# Patient Record
Sex: Female | Born: 1955 | Race: White | Hispanic: No | Marital: Married | Smoking: Never smoker
Health system: Southern US, Community
[De-identification: ages and names within clinical notes are randomized; demographics above are authoritative.]

## PROBLEM LIST (undated history)

## (undated) DIAGNOSIS — R519 Headache, unspecified: Secondary | ICD-10-CM

## (undated) DIAGNOSIS — K219 Gastro-esophageal reflux disease without esophagitis: Secondary | ICD-10-CM

## (undated) DIAGNOSIS — I2510 Atherosclerotic heart disease of native coronary artery without angina pectoris: Secondary | ICD-10-CM

## (undated) DIAGNOSIS — C50919 Malignant neoplasm of unspecified site of unspecified female breast: Secondary | ICD-10-CM

## (undated) DIAGNOSIS — R51 Headache: Secondary | ICD-10-CM

## (undated) DIAGNOSIS — Z87442 Personal history of urinary calculi: Secondary | ICD-10-CM

## (undated) DIAGNOSIS — F419 Anxiety disorder, unspecified: Secondary | ICD-10-CM

## (undated) DIAGNOSIS — R112 Nausea with vomiting, unspecified: Secondary | ICD-10-CM

## (undated) DIAGNOSIS — G4733 Obstructive sleep apnea (adult) (pediatric): Secondary | ICD-10-CM

## (undated) DIAGNOSIS — Z9889 Other specified postprocedural states: Secondary | ICD-10-CM

## (undated) DIAGNOSIS — D759 Disease of blood and blood-forming organs, unspecified: Secondary | ICD-10-CM

## (undated) DIAGNOSIS — E785 Hyperlipidemia, unspecified: Secondary | ICD-10-CM

## (undated) DIAGNOSIS — Z923 Personal history of irradiation: Secondary | ICD-10-CM

## (undated) DIAGNOSIS — F32A Depression, unspecified: Secondary | ICD-10-CM

## (undated) DIAGNOSIS — M8580 Other specified disorders of bone density and structure, unspecified site: Secondary | ICD-10-CM

## (undated) DIAGNOSIS — F329 Major depressive disorder, single episode, unspecified: Secondary | ICD-10-CM

## (undated) HISTORY — DX: Atherosclerotic heart disease of native coronary artery without angina pectoris: I25.10

## (undated) HISTORY — PX: ABDOMINAL HYSTERECTOMY: SHX81

## (undated) HISTORY — PX: BUNIONECTOMY: SHX129

## (undated) HISTORY — DX: Anxiety disorder, unspecified: F41.9

## (undated) HISTORY — DX: Hyperlipidemia, unspecified: E78.5

## (undated) HISTORY — DX: Major depressive disorder, single episode, unspecified: F32.9

## (undated) HISTORY — DX: Depression, unspecified: F32.A

## (undated) HISTORY — DX: Gastro-esophageal reflux disease without esophagitis: K21.9

## (undated) HISTORY — DX: Other specified disorders of bone density and structure, unspecified site: M85.80

## (undated) HISTORY — DX: Obstructive sleep apnea (adult) (pediatric): G47.33

## (undated) HISTORY — DX: Malignant neoplasm of unspecified site of unspecified female breast: C50.919

---

## 1997-08-09 ENCOUNTER — Ambulatory Visit (HOSPITAL_COMMUNITY): Admission: RE | Admit: 1997-08-09 | Discharge: 1997-08-09 | Payer: Self-pay | Admitting: Obstetrics and Gynecology

## 1998-03-03 ENCOUNTER — Other Ambulatory Visit: Admission: RE | Admit: 1998-03-03 | Discharge: 1998-03-03 | Payer: Self-pay | Admitting: Obstetrics and Gynecology

## 1998-04-09 ENCOUNTER — Emergency Department (HOSPITAL_COMMUNITY): Admission: EM | Admit: 1998-04-09 | Discharge: 1998-04-09 | Payer: Self-pay | Admitting: Emergency Medicine

## 1998-04-16 ENCOUNTER — Emergency Department (HOSPITAL_COMMUNITY): Admission: EM | Admit: 1998-04-16 | Discharge: 1998-04-16 | Payer: Self-pay | Admitting: Emergency Medicine

## 1998-08-08 ENCOUNTER — Encounter: Payer: Self-pay | Admitting: Obstetrics and Gynecology

## 1998-08-08 ENCOUNTER — Ambulatory Visit (HOSPITAL_COMMUNITY): Admission: RE | Admit: 1998-08-08 | Discharge: 1998-08-08 | Payer: Self-pay | Admitting: Obstetrics and Gynecology

## 1999-03-04 ENCOUNTER — Other Ambulatory Visit: Admission: RE | Admit: 1999-03-04 | Discharge: 1999-03-04 | Payer: Self-pay | Admitting: Obstetrics and Gynecology

## 1999-08-14 ENCOUNTER — Encounter: Payer: Self-pay | Admitting: Obstetrics and Gynecology

## 1999-08-14 ENCOUNTER — Ambulatory Visit (HOSPITAL_COMMUNITY): Admission: RE | Admit: 1999-08-14 | Discharge: 1999-08-14 | Payer: Self-pay | Admitting: Obstetrics and Gynecology

## 2000-03-10 ENCOUNTER — Other Ambulatory Visit: Admission: RE | Admit: 2000-03-10 | Discharge: 2000-03-10 | Payer: Self-pay | Admitting: Obstetrics and Gynecology

## 2000-08-19 ENCOUNTER — Ambulatory Visit (HOSPITAL_COMMUNITY): Admission: RE | Admit: 2000-08-19 | Discharge: 2000-08-19 | Payer: Self-pay | Admitting: Obstetrics and Gynecology

## 2000-08-19 ENCOUNTER — Encounter: Payer: Self-pay | Admitting: Obstetrics and Gynecology

## 2000-08-26 ENCOUNTER — Encounter: Admission: RE | Admit: 2000-08-26 | Discharge: 2000-08-26 | Payer: Self-pay | Admitting: Obstetrics and Gynecology

## 2000-08-26 ENCOUNTER — Encounter: Payer: Self-pay | Admitting: Obstetrics and Gynecology

## 2000-11-01 ENCOUNTER — Encounter: Payer: Self-pay | Admitting: General Surgery

## 2000-11-01 ENCOUNTER — Encounter: Admission: RE | Admit: 2000-11-01 | Discharge: 2000-11-01 | Payer: Self-pay | Admitting: General Surgery

## 2001-08-25 ENCOUNTER — Encounter: Admission: RE | Admit: 2001-08-25 | Discharge: 2001-08-25 | Payer: Self-pay | Admitting: Obstetrics and Gynecology

## 2001-08-25 ENCOUNTER — Encounter: Payer: Self-pay | Admitting: Obstetrics and Gynecology

## 2001-09-28 ENCOUNTER — Encounter: Payer: Self-pay | Admitting: Emergency Medicine

## 2001-09-28 ENCOUNTER — Emergency Department (HOSPITAL_COMMUNITY): Admission: EM | Admit: 2001-09-28 | Discharge: 2001-09-28 | Payer: Self-pay | Admitting: Emergency Medicine

## 2002-06-01 ENCOUNTER — Encounter: Payer: Self-pay | Admitting: Obstetrics and Gynecology

## 2002-06-01 ENCOUNTER — Encounter: Admission: RE | Admit: 2002-06-01 | Discharge: 2002-06-01 | Payer: Self-pay | Admitting: Obstetrics and Gynecology

## 2002-09-20 ENCOUNTER — Encounter: Payer: Self-pay | Admitting: Obstetrics and Gynecology

## 2002-09-20 ENCOUNTER — Encounter: Admission: RE | Admit: 2002-09-20 | Discharge: 2002-09-20 | Payer: Self-pay | Admitting: Obstetrics and Gynecology

## 2002-09-28 ENCOUNTER — Encounter: Admission: RE | Admit: 2002-09-28 | Discharge: 2002-09-28 | Payer: Self-pay | Admitting: Obstetrics and Gynecology

## 2002-09-28 ENCOUNTER — Encounter: Payer: Self-pay | Admitting: Obstetrics and Gynecology

## 2003-05-03 ENCOUNTER — Encounter: Admission: RE | Admit: 2003-05-03 | Discharge: 2003-05-03 | Payer: Self-pay | Admitting: Obstetrics and Gynecology

## 2003-10-11 ENCOUNTER — Encounter: Admission: RE | Admit: 2003-10-11 | Discharge: 2003-10-11 | Payer: Self-pay | Admitting: Obstetrics and Gynecology

## 2003-10-15 ENCOUNTER — Encounter: Admission: RE | Admit: 2003-10-15 | Discharge: 2003-10-15 | Payer: Self-pay | Admitting: Obstetrics and Gynecology

## 2004-05-05 ENCOUNTER — Other Ambulatory Visit: Admission: RE | Admit: 2004-05-05 | Discharge: 2004-05-05 | Payer: Self-pay | Admitting: Obstetrics and Gynecology

## 2004-10-20 ENCOUNTER — Encounter: Admission: RE | Admit: 2004-10-20 | Discharge: 2004-10-20 | Payer: Self-pay | Admitting: Obstetrics and Gynecology

## 2004-11-06 ENCOUNTER — Encounter: Admission: RE | Admit: 2004-11-06 | Discharge: 2004-11-06 | Payer: Self-pay | Admitting: Obstetrics and Gynecology

## 2005-05-10 ENCOUNTER — Other Ambulatory Visit: Admission: RE | Admit: 2005-05-10 | Discharge: 2005-05-10 | Payer: Self-pay | Admitting: Obstetrics and Gynecology

## 2005-12-03 ENCOUNTER — Encounter: Admission: RE | Admit: 2005-12-03 | Discharge: 2005-12-03 | Payer: Self-pay | Admitting: Obstetrics and Gynecology

## 2006-05-12 ENCOUNTER — Other Ambulatory Visit: Admission: RE | Admit: 2006-05-12 | Discharge: 2006-05-12 | Payer: Self-pay | Admitting: Obstetrics and Gynecology

## 2006-12-08 ENCOUNTER — Encounter: Admission: RE | Admit: 2006-12-08 | Discharge: 2006-12-08 | Payer: Self-pay | Admitting: Obstetrics and Gynecology

## 2006-12-15 ENCOUNTER — Encounter: Admission: RE | Admit: 2006-12-15 | Discharge: 2006-12-15 | Payer: Self-pay | Admitting: Obstetrics and Gynecology

## 2006-12-20 ENCOUNTER — Encounter: Admission: RE | Admit: 2006-12-20 | Discharge: 2006-12-20 | Payer: Self-pay | Admitting: Obstetrics and Gynecology

## 2007-01-03 ENCOUNTER — Encounter: Admission: RE | Admit: 2007-01-03 | Discharge: 2007-01-03 | Payer: Self-pay | Admitting: Obstetrics and Gynecology

## 2007-01-03 ENCOUNTER — Encounter (INDEPENDENT_AMBULATORY_CARE_PROVIDER_SITE_OTHER): Payer: Self-pay | Admitting: Diagnostic Radiology

## 2007-01-19 ENCOUNTER — Encounter (INDEPENDENT_AMBULATORY_CARE_PROVIDER_SITE_OTHER): Payer: Self-pay | Admitting: Diagnostic Radiology

## 2007-01-19 ENCOUNTER — Encounter: Admission: RE | Admit: 2007-01-19 | Discharge: 2007-01-19 | Payer: Self-pay | Admitting: Obstetrics and Gynecology

## 2007-03-07 ENCOUNTER — Ambulatory Visit (HOSPITAL_BASED_OUTPATIENT_CLINIC_OR_DEPARTMENT_OTHER): Admission: RE | Admit: 2007-03-07 | Discharge: 2007-03-07 | Payer: Self-pay | Admitting: General Surgery

## 2007-03-07 ENCOUNTER — Encounter (INDEPENDENT_AMBULATORY_CARE_PROVIDER_SITE_OTHER): Payer: Self-pay | Admitting: General Surgery

## 2007-03-07 ENCOUNTER — Encounter: Admission: RE | Admit: 2007-03-07 | Discharge: 2007-03-07 | Payer: Self-pay | Admitting: General Surgery

## 2007-06-14 ENCOUNTER — Other Ambulatory Visit: Admission: RE | Admit: 2007-06-14 | Discharge: 2007-06-14 | Payer: Self-pay | Admitting: Obstetrics and Gynecology

## 2008-03-05 ENCOUNTER — Encounter: Admission: RE | Admit: 2008-03-05 | Discharge: 2008-03-05 | Payer: Self-pay | Admitting: Obstetrics and Gynecology

## 2008-03-08 ENCOUNTER — Encounter: Admission: RE | Admit: 2008-03-08 | Discharge: 2008-03-08 | Payer: Self-pay | Admitting: Obstetrics and Gynecology

## 2008-07-16 ENCOUNTER — Other Ambulatory Visit: Admission: RE | Admit: 2008-07-16 | Discharge: 2008-07-16 | Payer: Self-pay | Admitting: Obstetrics and Gynecology

## 2009-03-18 ENCOUNTER — Encounter: Admission: RE | Admit: 2009-03-18 | Discharge: 2009-03-18 | Payer: Self-pay | Admitting: Obstetrics and Gynecology

## 2009-07-22 ENCOUNTER — Other Ambulatory Visit: Admission: RE | Admit: 2009-07-22 | Discharge: 2009-07-22 | Payer: Self-pay | Admitting: Obstetrics and Gynecology

## 2010-02-19 ENCOUNTER — Emergency Department (HOSPITAL_COMMUNITY): Admission: EM | Admit: 2010-02-19 | Discharge: 2010-02-19 | Payer: Self-pay | Admitting: Emergency Medicine

## 2010-03-24 ENCOUNTER — Encounter: Admission: RE | Admit: 2010-03-24 | Discharge: 2010-03-24 | Payer: Self-pay | Admitting: Obstetrics and Gynecology

## 2010-07-28 ENCOUNTER — Other Ambulatory Visit: Payer: Self-pay | Admitting: Nurse Practitioner

## 2010-07-28 ENCOUNTER — Other Ambulatory Visit
Admission: RE | Admit: 2010-07-28 | Discharge: 2010-07-28 | Payer: Self-pay | Source: Home / Self Care | Admitting: Obstetrics and Gynecology

## 2010-11-17 NOTE — Op Note (Signed)
NAMEAnaiza, [name]                    ACCOUNT NO.:  [id number]   MEDICAL RECORD NO.:  [id number]          PATIENT TYPE:  AMB   LOCATION:  DSC                          FACILITY:  [facility]   PHYSICIAN:  Angelia Mould. Derrell Lolling, M.D.DATE OF BIRTH:  1956-05-24   DATE OF PROCEDURE:  03/07/2007  DATE OF DISCHARGE:                               OPERATIVE REPORT   PREOPERATIVE DIAGNOSIS:  Atypical ductal hyperplasia left breast.   POSTOPERATIVE DIAGNOSIS:  Atypical ductal hyperplasia left breast.   OPERATION PERFORMED:  Excision left breast mass with needle localization  and specimen mammogram.   SURGEON:  Claud Kelp, M.D.   OPERATIVE INDICATIONS:  This is a 55 year old white female with a past  history of fibrocystic breast disease.  Recent screening mammogram  suggested density in the lateral aspect the left breast.  She has had  ultrasound and MRI.  She has had a stereotactic core biopsy of one area  laterally and an MRI guided biopsy of another area laterally and both of  these had a wire clip left.  The first biopsy showed a complex  sclerosing lesion with minimal atypical ductal hyperplasia and pathology  recommended excising this to rule out ductal carcinoma in situ.  The  other biopsy showed fibrocystic disease and calcifications but no  atypia.  I evaluated her as an outpatient.  We felt that both of these  area should be excised with a bracketed wire localization.  She is  brought to operating room electively.   OPERATIVE TECHNIQUE:  The patient underwent insertion of two localizing  wires this morning at the [facility].  These were  satisfactory.  The patient was taken to operating room.  General  anesthesia was induced.  The left breast was prepped and draped in  sterile fashion.  The patient was identified as correct patient, correct  procedure and correct site.  Intravenous antibiotics were given.  0.5%  Marcaine with epinephrine was used as local infiltration  anesthetic.   I observed two wires entering the lateral left breast at about the 3  o'clock position about 5-6 cm lateral to the areolar margin.  I chose to  make a radially oriented transverse incision from just outside the  areolar margin to near the wires.  Dissection was carried down into the  breast tissue.  I brought both of the wires into the wound and dissected  around the wires circumferentially using electrocautery.  The specimen  was marked with silk sutures to orient the pathologist.  I removed the  specimen.   The specimen underwent specimen mammogram by radiology and they said  that I had gotten both areas out completely.  The specimen sent for  routine histology.  Hemostasis was excellent and achieved with  electrocautery.  The wound was irrigated extensively.  We examined the  wound for about 5 minutes and found no other bleeding.  I placed some  interrupted 3-0 Vicryl sutures in the deeper tissues to bring the breast  tissue together and then the skin was closed with running subcuticular  suture of 4-0  Monocryl and Steri-Strips.  Clean bandages were placed and  the patient taken to the recovery room in stable condition.  Estimated  blood loss was about 10-15 mL.   COMPLICATIONS:  None.   Sponge, needle and instrument counts were correct.      Angelia Mould. Derrell Lolling, M.D.  Electronically Signed     HMI/MEDQ  D:  03/07/2007  T:  03/07/2007  Job:  [id number]   cc:   Norva Pavlov, M.D.  Artist Pais, M.D.

## 2011-03-19 ENCOUNTER — Other Ambulatory Visit: Payer: Self-pay | Admitting: Obstetrics and Gynecology

## 2011-03-19 DIAGNOSIS — Z1231 Encounter for screening mammogram for malignant neoplasm of breast: Secondary | ICD-10-CM

## 2011-03-29 ENCOUNTER — Ambulatory Visit
Admission: RE | Admit: 2011-03-29 | Discharge: 2011-03-29 | Disposition: A | Discharge status: Home / Self Care | Payer: BC Managed Care – PPO | Source: Ambulatory Visit | Attending: Obstetrics and Gynecology | Admitting: Obstetrics and Gynecology

## 2011-03-29 DIAGNOSIS — Z1231 Encounter for screening mammogram for malignant neoplasm of breast: Secondary | ICD-10-CM

## 2011-04-16 LAB — CBC
HCT: 42
Hemoglobin: 14.4
MCHC: 34.3
MCV: 93.3
Platelets: 192
RBC: 4.5
RDW: 12.5
WBC: 4

## 2011-04-16 LAB — COMPREHENSIVE METABOLIC PANEL WITH GFR
ALT: 18
AST: 18
Albumin: 4.1
Alkaline Phosphatase: 63
BUN: 12
CO2: 31
Calcium: 9.6
Chloride: 104
Creatinine, Ser: 0.8
GFR calc non Af Amer: >60
Glucose, Bld: 78
Potassium: 4.2
Sodium: 141
Total Bilirubin: 0.8
Total Protein: 6.1

## 2011-04-16 LAB — URINALYSIS, ROUTINE W REFLEX MICROSCOPIC
Bilirubin Urine: NEGATIVE
Glucose, UA: NEGATIVE
Hgb urine dipstick: NEGATIVE
Ketones, ur: NEGATIVE
Nitrite: NEGATIVE
Protein, ur: NEGATIVE
Specific Gravity, Urine: 1.018
Urobilinogen, UA: 0.2
pH: 7.5

## 2011-04-16 LAB — DIFFERENTIAL
Eosinophils Absolute: 0
Eosinophils Relative: 1
Lymphocytes Relative: 31
Lymphs Abs: 1.2
Monocytes Absolute: 0.4

## 2011-10-21 ENCOUNTER — Ambulatory Visit: Payer: BC Managed Care – PPO | Admitting: Physical Therapy

## 2011-12-16 ENCOUNTER — Other Ambulatory Visit: Payer: Self-pay | Admitting: Dermatology

## 2012-07-11 ENCOUNTER — Other Ambulatory Visit: Payer: Self-pay | Admitting: Obstetrics and Gynecology

## 2012-07-11 DIAGNOSIS — Z1231 Encounter for screening mammogram for malignant neoplasm of breast: Secondary | ICD-10-CM

## 2012-08-10 ENCOUNTER — Ambulatory Visit
Admission: RE | Admit: 2012-08-10 | Discharge: 2012-08-10 | Disposition: A | Discharge status: Home / Self Care | Payer: BC Managed Care – PPO | Source: Ambulatory Visit | Attending: Obstetrics and Gynecology | Admitting: Obstetrics and Gynecology

## 2012-08-10 DIAGNOSIS — Z1231 Encounter for screening mammogram for malignant neoplasm of breast: Secondary | ICD-10-CM

## 2012-12-18 ENCOUNTER — Other Ambulatory Visit: Payer: Self-pay | Admitting: Dermatology

## 2013-11-02 ENCOUNTER — Other Ambulatory Visit: Payer: Self-pay

## 2013-11-02 DIAGNOSIS — Z1231 Encounter for screening mammogram for malignant neoplasm of breast: Secondary | ICD-10-CM

## 2013-11-15 ENCOUNTER — Other Ambulatory Visit: Payer: Self-pay | Admitting: Obstetrics and Gynecology

## 2013-11-15 ENCOUNTER — Encounter (INDEPENDENT_AMBULATORY_CARE_PROVIDER_SITE_OTHER): Payer: Self-pay

## 2013-11-15 ENCOUNTER — Ambulatory Visit
Admission: RE | Admit: 2013-11-15 | Discharge: 2013-11-15 | Disposition: A | Discharge status: Home / Self Care | Payer: BC Managed Care – PPO | Source: Ambulatory Visit

## 2013-11-15 DIAGNOSIS — Z1231 Encounter for screening mammogram for malignant neoplasm of breast: Secondary | ICD-10-CM

## 2013-11-15 DIAGNOSIS — N63 Unspecified lump in unspecified breast: Secondary | ICD-10-CM

## 2013-12-04 ENCOUNTER — Ambulatory Visit
Admission: RE | Admit: 2013-12-04 | Discharge: 2013-12-04 | Disposition: A | Discharge status: Home / Self Care | Payer: BC Managed Care – PPO | Source: Ambulatory Visit | Attending: Obstetrics and Gynecology | Admitting: Obstetrics and Gynecology

## 2013-12-04 DIAGNOSIS — N63 Unspecified lump in unspecified breast: Secondary | ICD-10-CM

## 2014-04-10 ENCOUNTER — Ambulatory Visit: Payer: BC Managed Care – PPO

## 2014-04-18 ENCOUNTER — Ambulatory Visit: Payer: BC Managed Care – PPO | Attending: Otolaryngology

## 2014-04-18 DIAGNOSIS — Z5189 Encounter for other specified aftercare: Secondary | ICD-10-CM | POA: Diagnosis not present

## 2014-04-18 DIAGNOSIS — R490 Dysphonia: Secondary | ICD-10-CM | POA: Diagnosis not present

## 2014-04-26 ENCOUNTER — Ambulatory Visit: Payer: BC Managed Care – PPO

## 2014-04-26 DIAGNOSIS — Z5189 Encounter for other specified aftercare: Secondary | ICD-10-CM | POA: Diagnosis not present

## 2014-05-01 ENCOUNTER — Ambulatory Visit: Payer: BC Managed Care – PPO

## 2014-05-01 DIAGNOSIS — Z5189 Encounter for other specified aftercare: Secondary | ICD-10-CM | POA: Diagnosis not present

## 2014-05-08 ENCOUNTER — Ambulatory Visit: Payer: BC Managed Care – PPO | Attending: Otolaryngology

## 2014-05-08 DIAGNOSIS — R490 Dysphonia: Secondary | ICD-10-CM

## 2014-05-08 DIAGNOSIS — Z5189 Encounter for other specified aftercare: Secondary | ICD-10-CM | POA: Insufficient documentation

## 2014-05-08 NOTE — Therapy (Signed)
Speech Language Pathology Treatment  Patient Details  Name: Candace Campbell MRN: [id number] Date of Birth: September 29, 1955  Encounter Date: 05/08/2014      End of Session - 05/08/14 1259    Visit Number 3   Number of Visits 4   Date for OT Re-Evaluation 05/27/14   SLP Start Time 4098   SLP Stop Time 1191   SLP Time Calculation (min) 42 min      No past medical history on file.  No past surgical history on file.  There were no vitals taken for this visit.  Visit Diagnosis: Dysphonia          ADULT SLP TREATMENT - 05/08/14 1200    General Information   Behavior/Cognition Alert;Cooperative;Pleasant mood   Treatment Provided   Treatment provided --  voice   Pain Assessment   Pain Assessment No/denies pain   Cognitive-Linquistic Treatment   Treatment focused on Voice   Skilled Treatment Rates voice 7/10 (10=normal voice) today. Just came back from 2 hours of talking. PT reports reducing talk time last week - friend asked "Why are you so quiet?"  As sessions continue SLP learns of more situations pt involves herself in which are heavy voice use. SLP guided pt through activity to realize if pt desires positive change to voice she will need temporary lifestyle change. SLP strongly encouraged pt to eliminate some/all of these things temporarily in order to gain a voice rated 9-10/10.   Assessment / Recommendations / Plan   Plan Continue with current plan of care   Progression Toward Goals   Progression toward goals Progressing toward goals              SLP Long Term Goals - 05/08/14 1301    SLP LONG TERM GOAL #1   Title pt will talk in confidential voice for 10 minutes with rare min A   Time 4   Period Weeks   Status New   SLP LONG TERM GOAL #2   Title pt will adhere to voice conservation program between sessions as reported by pt   Time 4   Period Weeks   Status New          Plan - 05/08/14 1300    Clinical Impression Statement Pt presents with overuse of  voice. Skilled ST needed to assess voice conservation and assist pt with voice function exercises to improve voice quality   Speech Therapy Frequency min 1 x/week   Duration 4 weeks   Treatment/Interventions Compensatory strategies;Other (comment)  voice function exercises   Potential to Achieve Goals Good   Potential Considerations Previous level of function        Problem List There are no active problems to display for this patient.                                            [facility] 05/08/2014, 1:03 PM

## 2014-05-08 NOTE — Patient Instructions (Signed)
Begin to think about what is possible for you to "put on hold" until January to allow for voice rest.  Continue with voice conservation/hygiene measures

## 2014-05-15 ENCOUNTER — Ambulatory Visit: Payer: BC Managed Care – PPO

## 2014-05-15 DIAGNOSIS — R490 Dysphonia: Secondary | ICD-10-CM

## 2014-05-15 DIAGNOSIS — Z5189 Encounter for other specified aftercare: Secondary | ICD-10-CM | POA: Diagnosis not present

## 2014-05-15 NOTE — Patient Instructions (Signed)
You must further decr voice use for further success with vocal quality - sooner is better! Continue vocal function exercises until [facility] referral date

## 2014-05-15 NOTE — Therapy (Addendum)
Speech Language Pathology Treatment  Patient Details  Name: Candace Campbell MRN: [id number] Date of Birth: 1956-06-17  Encounter Date: 05/15/2014      End of Session - 05/15/14 1245    Visit Number 4   Number of Visits 4   Date for SLP Re-Evaluation 05/27/14   SLP Start Time 1155   SLP Time Calculation (min) 1238   SLP Time Calculation (min) 43 min   Activity Tolerance Patient tolerated treatment well  SLP limited pt vocalization due to pt report of pain in laryngeal area      No past medical history on file.  No past surgical history on file.  There were no vitals taken for this visit.  Visit Diagnosis: Dysphonia  S: Pt relayed to SLP [place] but was unable to do so, speaking frequently. 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       ADULT SLP TREATMENT - 05/15/14 0001    General Information   Behavior/Cognition Alert;Cooperative;Pleasant mood   Treatment Provided   Treatment provided --  voice   Pain Assessment   Pain Assessment 0-10   Pain Score 2    Pain Location --  vocal area   Pain Descriptors / Indicators Discomfort   Pain Intervention(s) Monitored during session;Limited activity within patient's tolerance   Cognitive-Linquistic Treatment   Treatment focused on Voice   Skilled Treatment Rates voice 6/10 (10=normal voice) today. Just came back from 2 hours of talking today. She has tried to reduce talking with grandson but has had difficulty with this. Reports talking at farm 9:15-11:15 could not be avoided - spoke softly but frequently. Pt was able to get approx 80% voice rest past weekend with grandson away and voice rated between 8/10 and 9/10 on Monday. SLP reiterated that decr'd voice use results in better quality voice. Pt said "I really want to stop talking over Christmas." SLP encouraged earlier voice rest than Christmastime. Vocal Function  Exercises completed as pt has not completed them yet today. SLP told pt to continue with exercises until appointment at [facility] / Recommendations / Plan   Plan Discharge SLP treatment due to (comment)  exercses completed without cues, pt knows to incr voice rest   General Recommendations   General recommendations --  pt must further decr voice use; SLP has encouraged this   Follow up Recommendations --  may benefit from [facility] referral for additional tx program?   Progression Toward Goals   Progression toward goals --  partially met goals; needs to further decr voice use              SLP Long Term Goals - 05/15/14 1248    SLP LONG TERM GOAL #1   Status Partially Met   SLP LONG TERM GOAL #2   Status Partially Met          Plan - 05/15/14 1247    Clinical Impression Statement Pt needs to further decr voice use to have further improvement with vocal quality.    Treatment/Interventions Compensatory strategies;SLP instruction and feedback  vocal function exercises   Potential to Achieve Goals Good   Potential Considerations Cooperation/participation level   SLP Home Exercise Plan continue vocal function exercises, further decr voice use   Consulted and Agree with Plan of Care Patient          SPEECH THERAPY DISCHARGE SUMMARY  Visits from Start of Care: 4  Remaining deficits: Dysphonia remains. Pt, at this time, is choosing to limit voice use over Christmas. SLP has encouraged her the earlier she can implement as close to full voice rest as she can, the earlier she will experience improved voice quality. She is beginning to complete Vocal Function Exercises and completion is not as prescribed (once/day as opposed to twice/day).   Education / Equipment: Vocal function exercises, vocal conservation/hygeine.   Plan: Patient agrees to discharge.  Patient goals were partially met. Patient is being discharged due to                                                      ?meeting her current rehab potential.???         Problem List There are no active problems to display for this patient.                                              [facility] 05/15/2014, 12:49 PM

## 2014-07-05 HISTORY — PX: BREAST BIOPSY: SHX20

## 2014-07-05 HISTORY — PX: BREAST LUMPECTOMY: SHX2

## 2015-01-27 ENCOUNTER — Other Ambulatory Visit: Payer: Self-pay

## 2015-01-27 DIAGNOSIS — Z1231 Encounter for screening mammogram for malignant neoplasm of breast: Secondary | ICD-10-CM

## 2015-03-04 ENCOUNTER — Ambulatory Visit
Admission: RE | Admit: 2015-03-04 | Discharge: 2015-03-04 | Disposition: A | Discharge status: Home / Self Care | Payer: BC Managed Care – PPO | Source: Ambulatory Visit

## 2015-03-04 DIAGNOSIS — Z1231 Encounter for screening mammogram for malignant neoplasm of breast: Secondary | ICD-10-CM

## 2015-03-06 ENCOUNTER — Other Ambulatory Visit: Payer: Self-pay | Admitting: Obstetrics and Gynecology

## 2015-03-06 DIAGNOSIS — R928 Other abnormal and inconclusive findings on diagnostic imaging of breast: Secondary | ICD-10-CM

## 2015-03-11 ENCOUNTER — Other Ambulatory Visit: Payer: Self-pay | Admitting: Obstetrics and Gynecology

## 2015-03-11 ENCOUNTER — Ambulatory Visit
Admission: RE | Admit: 2015-03-11 | Discharge: 2015-03-11 | Disposition: A | Discharge status: Home / Self Care | Payer: BC Managed Care – PPO | Source: Ambulatory Visit | Attending: Obstetrics and Gynecology | Admitting: Obstetrics and Gynecology

## 2015-03-11 DIAGNOSIS — R928 Other abnormal and inconclusive findings on diagnostic imaging of breast: Secondary | ICD-10-CM

## 2015-03-13 ENCOUNTER — Other Ambulatory Visit: Payer: Self-pay | Admitting: Obstetrics and Gynecology

## 2015-03-13 ENCOUNTER — Ambulatory Visit
Admission: RE | Admit: 2015-03-13 | Discharge: 2015-03-13 | Disposition: A | Discharge status: Home / Self Care | Payer: BC Managed Care – PPO | Source: Ambulatory Visit | Attending: Obstetrics and Gynecology | Admitting: Obstetrics and Gynecology

## 2015-03-13 DIAGNOSIS — C50919 Malignant neoplasm of unspecified site of unspecified female breast: Secondary | ICD-10-CM

## 2015-03-13 DIAGNOSIS — R928 Other abnormal and inconclusive findings on diagnostic imaging of breast: Secondary | ICD-10-CM

## 2015-03-13 HISTORY — DX: Malignant neoplasm of unspecified site of unspecified female breast: C50.919

## 2015-03-17 ENCOUNTER — Telehealth: Payer: Self-pay | Admitting: *Deleted

## 2015-03-17 DIAGNOSIS — C50311 Malignant neoplasm of lower-inner quadrant of right female breast: Secondary | ICD-10-CM | POA: Insufficient documentation

## 2015-03-17 NOTE — Telephone Encounter (Signed)
Confirmed [facility] for 03/19/15 at 830am .  Instructions and contact information given.

## 2015-03-19 ENCOUNTER — Encounter: Payer: Self-pay | Admitting: Physical Therapy

## 2015-03-19 ENCOUNTER — Ambulatory Visit
Admission: RE | Admit: 2015-03-19 | Discharge: 2015-03-19 | Disposition: A | Discharge status: Home / Self Care | Payer: BC Managed Care – PPO | Source: Ambulatory Visit | Attending: Radiation Oncology | Admitting: Radiation Oncology

## 2015-03-19 ENCOUNTER — Other Ambulatory Visit: Payer: Self-pay | Admitting: General Surgery

## 2015-03-19 ENCOUNTER — Encounter: Payer: Self-pay | Admitting: Skilled Nursing Facility1

## 2015-03-19 ENCOUNTER — Encounter: Payer: Self-pay | Admitting: Hematology and Oncology

## 2015-03-19 ENCOUNTER — Encounter: Payer: Self-pay | Admitting: *Deleted

## 2015-03-19 ENCOUNTER — Ambulatory Visit (HOSPITAL_BASED_OUTPATIENT_CLINIC_OR_DEPARTMENT_OTHER): Payer: BC Managed Care – PPO | Admitting: Hematology and Oncology

## 2015-03-19 ENCOUNTER — Ambulatory Visit (HOSPITAL_BASED_OUTPATIENT_CLINIC_OR_DEPARTMENT_OTHER): Payer: BC Managed Care – PPO

## 2015-03-19 ENCOUNTER — Ambulatory Visit: Payer: BC Managed Care – PPO | Attending: General Surgery | Admitting: Physical Therapy

## 2015-03-19 ENCOUNTER — Encounter: Payer: Self-pay | Admitting: Nurse Practitioner

## 2015-03-19 ENCOUNTER — Other Ambulatory Visit (HOSPITAL_BASED_OUTPATIENT_CLINIC_OR_DEPARTMENT_OTHER): Payer: BC Managed Care – PPO

## 2015-03-19 VITALS — BP 142/81 | HR 76 | Temp 98.1°F | Resp 18 | Ht 65.0 in | Wt 134.6 lb

## 2015-03-19 DIAGNOSIS — C50511 Malignant neoplasm of lower-outer quadrant of right female breast: Secondary | ICD-10-CM | POA: Insufficient documentation

## 2015-03-19 DIAGNOSIS — Z9189 Other specified personal risk factors, not elsewhere classified: Secondary | ICD-10-CM | POA: Insufficient documentation

## 2015-03-19 DIAGNOSIS — C50311 Malignant neoplasm of lower-inner quadrant of right female breast: Secondary | ICD-10-CM

## 2015-03-19 LAB — CBC WITH DIFFERENTIAL/PLATELET
BASO%: 0.5 % (ref 0.0–2.0)
BASOS ABS: 0 10*3/uL (ref 0.0–0.1)
EOS%: 1.6 % (ref 0.0–7.0)
Eosinophils Absolute: 0.1 10*3/uL (ref 0.0–0.5)
HCT: 42.7 % (ref 34.8–46.6)
HEMOGLOBIN: 14.2 g/dL (ref 11.6–15.9)
LYMPH%: 28.2 % (ref 14.0–49.7)
MCH: 31.1 pg (ref 25.1–34.0)
MCHC: 33.2 g/dL (ref 31.5–36.0)
MCV: 93.6 fL (ref 79.5–101.0)
MONO#: 0.4 10*3/uL (ref 0.1–0.9)
MONO%: 8.7 % (ref 0.0–14.0)
NEUT#: 2.5 10*3/uL (ref 1.5–6.5)
NEUT%: 61 % (ref 38.4–76.8)
Platelets: 215 10*3/uL (ref 145–400)
RBC: 4.56 10*6/uL (ref 3.70–5.45)
RDW: 13.4 % (ref 11.2–14.5)
WBC: 4.1 10*3/uL (ref 3.9–10.3)
lymph#: 1.1 10*3/uL (ref 0.9–3.3)

## 2015-03-19 LAB — COMPREHENSIVE METABOLIC PANEL (CC13)
ALT: 16 U/L (ref 0–55)
AST: 18 U/L (ref 5–34)
Albumin: 4.1 g/dL (ref 3.5–5.0)
Alkaline Phosphatase: 78 U/L (ref 40–150)
Anion Gap: 8 mEq/L (ref 3–11)
BUN: 10.4 mg/dL (ref 7.0–26.0)
CHLORIDE: 104 meq/L (ref 98–109)
CO2: 30 meq/L — AB (ref 22–29)
Calcium: 9.4 mg/dL (ref 8.4–10.4)
Creatinine: 0.8 mg/dL (ref 0.6–1.1)
EGFR: 81 mL/min/{1.73_m2} — ABNORMAL LOW (ref >90)
GLUCOSE: 123 mg/dL (ref 70–140)
POTASSIUM: 3.7 meq/L (ref 3.5–5.1)
SODIUM: 142 meq/L (ref 136–145)
Total Bilirubin: 0.64 mg/dL (ref 0.20–1.20)
Total Protein: 6.6 g/dL (ref 6.4–8.3)

## 2015-03-19 LAB — PROTIME-INR
INR: 0.9 — ABNORMAL LOW (ref 2.00–3.50)
Protime: 10.8 Seconds (ref 10.6–13.4)

## 2015-03-19 NOTE — Progress Notes (Signed)
Subjective:     Patient ID: Candace Campbell, female   DOB: [birth year], 59 y.o.   MRN: [id number]  HPI   Review of Systems     Objective:   Physical Exam For the patient to understand and be given the tools to implement a healthy plant based diet during their cancer diagnosis.     Assessment:     Patient was seen today and found to be in good spirits and accompanied by her husband. Pt states she has terrible GERD which is affected by many different foods including nuts and oily foods. Pt states she avoids all processed foods due to her GERD. Pts ht 5'5'' BMI 22.4, and wt 134 pounds. Pts CO2 is 30.      Plan:     Dietitian educated the patient on implementing a plant based diet by incorporating more plant proteins, fruits, and vegetables. As a part of a healthy routine physical activity was discussed.  A folder of evidence based information with a focus on a plant based diet and general nutrition during cancer was given to the patient.  The importance of legitimate, evidence based information was discussed and examples were given. As a part of the continuum of care the cancer dietitian's contact information was given to the patient in the event they would like to have a follow up appointment.

## 2015-03-19 NOTE — Progress Notes (Signed)
[place] NOTE  Patient Care Team: Darcus Austin, MD as PCP - General (Family Medicine) Thurnell Lose, MD as Consulting Physician (Obstetrics and Gynecology) [name], MD as Consulting Physician (General Surgery) Nicholas Lose, MD as Consulting Physician (Hematology and Oncology) Thea Silversmith, MD as Consulting Physician (Radiation Oncology) Thea Silversmith, MD as Consulting Physician (Radiation Oncology) Mauro Kaufmann, RN as Registered Nurse Rockwell Germany, RN as Registered Nurse  CHIEF COMPLAINTS/PURPOSE OF CONSULTATION:  Newly diagnosed breast cancer  HISTORY OF PRESENTING ILLNESS:  Candace Campbell 59 y.o. female is here because of recent diagnosis of right breast cancer. She presented for routine screening mammogram that revealed distortion in the right breast area and this led to an ultrasound that confirmed a suspicious mass measuring 0.7 cm. 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 She was presented at the multidisciplinary tumor board and she is here at [facility] clinic to discuss a treatment plan.  She reports that she has had multiple problems with bleeding with procedures including dental procedures as well as prior hysterectomy. Even with the breast biopsy she had a very large hematoma. It was at her daughter was diagnosed with 1 mg disease and has had multiple heavy menstrual bleeding periods.  I reviewed her records extensively and collaborated the history with the patient.  SUMMARY OF ONCOLOGIC HISTORY:   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 female breast   03/04/2015 Mammogram Right breast possible distortion, ultrasound 03/11/2015 suspicious mass right breast 6:00 irregular hypoechoic mass 0.7 x 0.6 x 0.6 cm, adjacent to this innumerable cysts and clusters of cysts measuring 0.7 cm   03/13/2015 Initial Diagnosis Right breast biopsy: Invasive ductal carcinoma grade 1,  ER/PR positive HER-2 negative Ki-67 5%, T1 BN0 clinical stage 1A    In terms of breast cancer risk profile:  She menarched at early age of 28 and had hysterectomy at age 29  She had 2 pregnancy, her first child was born at age 62  She has received birth control pills for approximately 6 years.  She was exposed to estrogen patches for the past 4 years for menopausal symptoms.  She has no family history of Breast/GYN/GI cancer  MEDICAL HISTORY:  Past Medical History  Diagnosis Date  . Breast cancer   . Depression   . GERD (gastroesophageal reflux disease)   . Anxiety     SURGICAL HISTORY: Past Surgical History  Procedure Laterality Date  . Abdominal hysterectomy    . Breast lumpectomy Left     SOCIAL HISTORY: Social History   Social History  . Marital Status: Married    Spouse Name: N/A  . Number of Children: N/A  . Years of Education: N/A   Occupational History  . Not on file.   Social History Main Topics  . Smoking status: Never Smoker   . Smokeless tobacco: Not on file  . Alcohol Use: Yes  . Drug Use: No  . Sexual Activity: Not on file   Other Topics Concern  . Not on file   Social History Narrative    FAMILY HISTORY: Family History  Problem Relation Age of Onset  . Breast cancer Maternal Grandmother     ALLERGIES:  is allergic to latex and sulfa antibiotics.  MEDICATIONS:  Current Outpatient Prescriptions  Medication Sig Dispense Refill  . ALPRAZolam (XANAX) 0.25 MG tablet     . BELSOMRA 20 MG TABS TK 1 T  PO HS  1  . BRINTELLIX 20 MG TABS TK 1 T PO QAM  1  . EVEKEO 10 MG TABS     . lamoTRIgine (LAMICTAL) 200 MG tablet     . omeprazole (PRILOSEC OTC) 20 MG tablet Take 20 mg by mouth daily.    . mometasone (NASONEX) 50 MCG/ACT nasal spray Place 2 sprays into the nose daily. Instill 2 puffs in each nostril daily     No current facility-administered medications for this visit.    REVIEW OF SYSTEMS:   Constitutional: Denies fevers, chills or  abnormal night sweats Eyes: Denies blurriness of vision, double vision or watery eyes Ears, nose, mouth, throat, and face: Denies mucositis or sore throat Respiratory: Denies cough, dyspnea or wheezes Cardiovascular: Denies palpitation, chest discomfort or lower extremity swelling Gastrointestinal:  Denies nausea, heartburn or change in bowel habits Skin: Denies abnormal skin rashes Lymphatics: Denies new lymphadenopathy or easy bruising Neurological:Denies numbness, tingling or new weaknesses Behavioral/Psych: Mood is stable, no new changes  Breast: Bruising from recent biopsy All other systems were reviewed with the patient and are negative.  PHYSICAL EXAMINATION: ECOG PERFORMANCE STATUS: 1 - Symptomatic but completely ambulatory  Filed Vitals:   03/19/15 0900  BP: 142/81  Pulse: 76  Temp: 98.1 F (36.7 C)  Resp: 18   Filed Weights   03/19/15 0900  Weight: 134 lb 9.6 oz (61.054 kg)    GENERAL:alert, no distress and comfortable SKIN: skin color, texture, turgor are normal, no rashes or significant lesions EYES: normal, conjunctiva are pink and non-injected, sclera clear OROPHARYNX:no exudate, no erythema and lips, buccal mucosa, and tongue normal  NECK: supple, thyroid normal size, non-tender, without nodularity LYMPH:  no palpable lymphadenopathy in the cervical, axillary or inguinal LUNGS: clear to auscultation and percussion with normal breathing effort HEART: regular rate & rhythm and no murmurs and no lower extremity edema ABDOMEN:abdomen soft, non-tender and normal bowel sounds Musculoskeletal:no cyanosis of digits and no clubbing  PSYCH: alert & oriented x 3 with fluent speech NEURO: no focal motor/sensory deficits BREAST: No palpable nodules in breast. No palpable axillary or supraclavicular lymphadenopathy (exam performed in the presence of a chaperone)   LABORATORY DATA:  I have reviewed the data as listed Lab Results  Component Value Date   WBC 4.1  03/19/2015   HGB 14.2 03/19/2015   HCT 42.7 03/19/2015   MCV 93.6 03/19/2015   PLT 215 03/19/2015   Lab Results  Component Value Date   NA 142 03/19/2015   K 3.7 03/19/2015   CL 104 03/02/2007   CO2 30* 03/19/2015    ASSESSMENT AND PLAN:  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 female breast Right breast biopsy 03/13/2015: Invasive ductal carcinoma grade 1, ER/PR positive HER-2 negative Ki-67 5%, T1 BN0 clinical stage 1A, 0.7 cm 6:00 position by ultrasound  Pathology and radiology counseling:Discussed with the patient, the details of pathology including the type of breast cancer,the clinical staging, the significance of ER, PR and HER-2/neu receptors and the implications for treatment. After reviewing the pathology in detail, we proceeded to discuss the different treatment options between surgery, radiation, chemotherapy, antiestrogen therapies.  Recommendations: 1. Breast conserving surgery followed by 2. Oncotype DX testing to determine if chemotherapy would be of any benefit followed by 3. Adjuvant radiation therapy followed by 4. Adjuvant antiestrogen therapy  Oncotype counseling: I discussed Oncotype DX test. I explained to the patient that this is a 21 gene panel to evaluate patient tumors DNA to calculate recurrence  score. This would help determine whether patient has high risk or intermediate risk or low risk breast cancer. She understands that if her tumor was found to be high risk, she would benefit from systemic chemotherapy. If low risk, no need of chemotherapy. If she was found to be intermediate risk, we would need to evaluate the score as well as other risk factors and determine if an abbreviated chemotherapy may be of benefit.  Return to clinic after surgery to discuss final pathology report and then determine if Oncotype DX testing will need to be sent.  All questions were answered. The patient knows to call the clinic with any problems, questions or concerns.     Rulon Eisenmenger, MD 11:54 AM

## 2015-03-19 NOTE — Assessment & Plan Note (Signed)
Right breast biopsy 03/13/2015: Invasive ductal carcinoma grade 1, ER/PR positive HER-2 negative Ki-67 5%, T1 BN0 clinical stage 1A, 0.7 cm 6:00 position by ultrasound  Pathology and radiology counseling:Discussed with the patient, the details of pathology including the type of breast cancer,the clinical staging, the significance of ER, PR and HER-2/neu receptors and the implications for treatment. After reviewing the pathology in detail, we proceeded to discuss the different treatment options between surgery, radiation, chemotherapy, antiestrogen therapies.  Recommendations: 1. Breast conserving surgery followed by 2. Oncotype DX testing to determine if chemotherapy would be of any benefit followed by 3. Adjuvant radiation therapy followed by 4. Adjuvant antiestrogen therapy  Oncotype counseling: I discussed Oncotype DX test. I explained to the patient that this is a 21 gene panel to evaluate patient tumors DNA to calculate recurrence score. This would help determine whether patient has high risk or intermediate risk or low risk breast cancer. She understands that if her tumor was found to be high risk, she would benefit from systemic chemotherapy. If low risk, no need of chemotherapy. If she was found to be intermediate risk, we would need to evaluate the score as well as other risk factors and determine if an abbreviated chemotherapy may be of benefit.  Return to clinic after surgery to discuss final pathology report and then determine if Oncotype DX testing will need to be sent.

## 2015-03-19 NOTE — Progress Notes (Signed)
Ms. Fix is a very pleasant 59 y.o. 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  Biopsy results revealed the tumor's prognostic profile is ER positive, PR positive, and HER2/neu negative. Ki67 is 5%.  She presents today with her husband to the [facility]) for treatment consideration and recommendations from the breast surgeon, radiation oncologist, and medical oncologist.     I briefly met with Ms. Cohoon and her husband during her [facility] visit today. We discussed the purpose of the Survivorship Clinic, which will include monitoring for recurrence, coordinating completion of age and gender-appropriate cancer screenings, promotion of overall wellness, as well as managing potential late/long-term side effects of anti-cancer treatments.    The treatment plan for Ms. Gwilliam will likely include surgery, radiation therapy, and anti-estrogen therapy.  As of today, the intent of treatment for Ms. Ciampa is cure, therefore she will be eligible for the Survivorship Clinic upon her completion of treatment.  Her survivorship care plan (SCP) document will be drafted and updated throughout the course of her treatment trajectory. She will receive the SCP in an office visit with myself in the Survivorship Clinic once she has completed treatment.   Ms. Mcmanaway was encouraged to ask questions and all questions were answered to her satisfaction.  She was given my business card and encouraged to contact me with any concerns regarding survivorship.  I look forward to participating in her care.   Kenn File, Section [phone]

## 2015-03-19 NOTE — Patient Instructions (Signed)

## 2015-03-19 NOTE — Progress Notes (Signed)
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   HISTORY OF PRESENT ILLNESS::Candace Campbell is a 58 y.o. female who underwent a screening mammogram on 03/05/15, she was noted to have a distortion in the right breast. Further views showed this area of distortion in the lower breast and a mass in the upper inner quadrant of the right breast measuring 6 mm. These areas were not palpable. Biopsy of the anterior area showed fibrocystic change. Biopsy of the mass showed Grade I invasive ductal carcinoma ER+, PR+, Ki-67 5%. MRI was recommended at tumor board due to breast density. She has recovered from her breast biopsy well. She does have a history of fibrocystic disease and multiple mammogram call backs and biopsies. She is on an estrogen patch and cream. She has anxiety and depression. She is accompanied by her husband. She is interested in breast conservation.   PREVIOUS RADIATION THERAPY: No  Past medical, social and family history were reviewed in the electronic chart. Review of symptoms was reviewed in the electronic chart. Medications were reviewed in the electronic chart.   Age at first menstrual period: 59 yo No, she is not still having periods. Approximate age at last period: 59 yo Yes, she has used hormone replacement. Length of time: 4 years. She has carried 2 children to term. Age at first live birth: 59 yo Not currently or trying to become pregnant Yes, she has used birth control pills or hormone shots for contraception. Length of time: 1981-1987 Last colonoscopy: 2011 Last bone density: 5/16 Last pap smear: 5/16 Date of first mammogram: 1988  PHYSICAL EXAM:  Vitals with BMI 03/19/2015  Height $Remov'5\' 5"'yIWtxa$   Weight 134 lbs 10 oz  BMI 50.5    Systolic 697  Diastolic 81  Pulse 76  Respirations 18    She is a pleasant female in no distress. Bruising in the inferior aspect of the left breast consistent with previous lumpectomy. No papable abnormalities. She has a well healed surgical incision in the lateral aspect of the left breast consistent with previous lumpectomy. She has no cervical, supraclavicular, or axillary adenopathy. She has bruising at the 6 o'clock position of the right breast associated with her biopsy site.  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  PLAN: I told Mrs. Satchell to discontinue her estrogen patch and cream. She is not interested in an MRI; she did have one in 2008 along with an MRI guided biopsy which was very painful for her. She would like to proceed with breast conservation.  I spoke to the patient today regarding her diagnosis and options for treatment. We discussed the equivalence in terms of survival and local failure between mastectomy and breast conservation. We discussed the role of radiation in decreasing local failures in patients who undergo lumpectomy. We discussed the process of simulation and the placement tattoos. We discussed 4-6 weeks of treatment as an outpatient. We discussed the possibility of asymptomatic lung damage. We discussed the low likelihood of secondary malignancies. We discussed the possible side effects including but not limited to skin redness, fatigue, permanent skin darkening, and breast swelling. We discussed the process of simulation and the placement of tattoos. I will see her back after her Oncotype score. I did  clarify with her that if she needed chemotherapy this would be performed prior to radiation. She met with medical oncology as well as a member of our patient family support team and our physical therapist. I will plan on seeing her back after her surgery.   I spent 40 minutes face to face with the patient and more than 50% of that time was spent  in counseling and/or coordination of care.    This document serves as a record of services personally performed by Thea Silversmith, MD. It was created on her behalf by Arlyce Harman, a trained medical scribe. The creation of this record is based on the scribe's personal observations and the provider's statements to them. This document has been checked and approved by the attending provider.    ------------------------------------------------  Thea Silversmith, MD

## 2015-03-19 NOTE — Progress Notes (Signed)
Clinical Social Work [place] Psychosocial Distress Screening [place]  Patient completed distress screening protocol and scored a 9 on the Psychosocial Distress Thermometer which indicates severe distress. Clinical Social Worker met with patient in [facility] to assess for distress and other psychosocial needs. Patient stated she was doing "ok" and felt comfortable with her treatment plan and treatment team.  Patient stated she had a history of anxiety and depression, but did have established care in the community. CSW and patient discussed common feeling and emotions when being diagnosed with cancer, and the importance of support during treatment. CSW informed patient of the support team and support services at [facility], and patient was agreeable to an alight guide referral. CSW provided contact information and encouraged patient to call with any questions or concerns.  ONCBCN DISTRESS SCREENING 03/19/2015  Screening Type Initial Screening  Distress experienced in past week (1-10) 9  Practical problem type Childcare  Family Problem type Children  Emotional problem type Depression;Nervousness/Anxiety;Adjusting to illness  Spiritual/Religous concerns type Facing my mortality  Information Concerns Type Lack of info about diagnosis;Lack of info about treatment;Lack of info about complementary therapy choices;Lack of info about maintaining fitness  Physical Problem type Sleep/insomnia;Mouth sores/swallowing  Physician notified of physical symptoms Yes  Referral to clinical psychology No  Referral to clinical social work Yes  Referral to dietition No  Referral to financial advocate No  Referral to support programs Yes  Referral to palliative care No   Johnnye Lana, MSW, LCSW, OSW-C Clinical Social Worker [place] [phone]

## 2015-03-19 NOTE — Progress Notes (Signed)
MD note created during office visit provided to patient.  Copy sent to scan.   

## 2015-03-19 NOTE — Therapy (Signed)
[place], [place], [address] Phone: [phone]   Fax:  [phone]  Physical Therapy Evaluation  Patient Details  Name: Candace Campbell MRN: [id number] Date of Birth: 07/24/1955 Referring Provider:  [name], MD  Encounter Date: 03/19/2015      PT End of Session - 03/19/15 1154    Visit Number 1   Number of Visits 1   PT Start Time 1107   PT Stop Time 1135   PT Time Calculation (min) 28 min   Activity Tolerance Patient tolerated treatment well   Behavior During Therapy [facility] for tasks assessed/performed      Past Medical History  Diagnosis Date  . Breast cancer   . Depression   . GERD (gastroesophageal reflux disease)   . Anxiety     Past Surgical History  Procedure Laterality Date  . Abdominal hysterectomy    . Breast lumpectomy Left     There were no vitals filed for this visit.  Visit Diagnosis:  Carcinoma of lower outer quadrant of right breast - Plan: PT plan of care cert/re-cert      Subjective Assessment - 03/19/15 1144    Subjective Patient was een today for a baseline assessment of her newly diagnosed right breast cancer.   Patient is accompained by: Family member   Pertinent History Patient was diagnosed on 03/04/15 with right lower outer quadrant breast cancer.  It is ER/PR positive, HER2 negative, grade 1, with a Ki67 of 5%.  It measures 7 mm in size.   Patient Stated Goals Reduce lymphedema risk and learn post op shoulder ROM HEP   Currently in Pain? Yes   Pain Score 3    Pain Location Ankle   Pain Orientation Left   Pain Descriptors / Indicators Aching   Pain Onset More than a month ago  Injured occurred on 02/08/15   Aggravating Factors  Walking   Pain Relieving Factors Elevation   Effect of Pain on Daily Activities She fell on 02/08/15 sliding on mud and tore ligaments in her ankle.  She is currrently wearing a boot for 6-11 weeks.            [facility] PT Assessment  - 03/19/15 0001    Assessment   Medical Diagnosis Right breast cancer   Onset Date/Surgical Date 03/04/15   Hand Dominance Left   Prior Therapy none   Precautions   Precautions Other (comment)  Active cancer   Restrictions   Weight Bearing Restrictions No   Balance Screen   Has the patient fallen in the past 6 months Yes   How many times? 1   Has the patient had a decrease in activity level because of a fear of falling?  No   Is the patient reluctant to leave their home because of a fear of falling?  No   Home Social worker Private residence   Living Arrangements Spouse/significant other;Children  Husband, 2 grown children and 3 y.o. grandchild   Available Help at Discharge Family   Prior Function   Level of Independence Independent   Vocation Full time employment   Architectural technologist; preschool music teacher   Leisure She is unable to exercise since her ankle injury but was using a glider 4x/wk for 30-40 min and yoga 4x/wk   Cognition   Overall Cognitive Status Within Functional Limits for tasks assessed   Posture/Postural Control   Posture/Postural Control No significant limitations   ROM /  Strength   AROM / PROM / Strength AROM;Strength   AROM   AROM Assessment Site Shoulder   Right/Left Shoulder Right;Left   Right Shoulder Extension 55 Degrees   Right Shoulder Flexion 163 Degrees   Right Shoulder ABduction 167 Degrees   Right Shoulder Internal Rotation 60 Degrees   Right Shoulder External Rotation 75 Degrees   Left Shoulder Extension 58 Degrees   Left Shoulder Flexion 163 Degrees   Left Shoulder ABduction 170 Degrees   Left Shoulder Internal Rotation 68 Degrees   Left Shoulder External Rotation 84 Degrees   Strength   Overall Strength Within functional limits for tasks performed           LYMPHEDEMA/ONCOLOGY QUESTIONNAIRE - 03/19/15 1152    Type   Cancer Type Right breast cancer   Lymphedema Assessments   Lymphedema Assessments  Upper extremities   Right Upper Extremity Lymphedema   10 cm Proximal to Olecranon Process 24.8 cm   Olecranon Process 21.9 cm   10 cm Proximal to Ulnar Styloid Process 19.2 cm   Just Proximal to Ulnar Styloid Process 13.4 cm   Across Hand at [employer] 18.4 cm   At [place] of 2nd Digit 5.9 cm   Left Upper Extremity Lymphedema   10 cm Proximal to Olecranon Process 25.2 cm   Olecranon Process 21.3 cm   10 cm Proximal to Ulnar Styloid Process 17.2 cm   Just Proximal to Ulnar Styloid Process 13.1 cm   Across Hand at [employer] 17.5 cm   At [place] of 2nd Digit 5.6 cm       Patient was instructed today in a home exercise program today for post op shoulder range of motion. These included active assist shoulder flexion in sitting, scapular retraction, wall walking with shoulder abduction, and hands behind head external rotation.  She was encouraged to do these twice a day, holding 3 seconds and repeating 5 times when permitted by her physician.           PT Education - 03/19/15 1153    Education provided Yes   Education Details Post op shoulder ROM HEP and lymphedema risk reduction   Person(s) Educated Patient;Spouse   Methods Explanation;Demonstration;Handout   Comprehension Verbalized understanding;Returned demonstration              Breast Clinic Goals - 03/19/15 1200    Patient will be able to verbalize understanding of pertinent lymphedema risk reduction practices relevant to her diagnosis specifically related to skin care.   Time 1   Period Days   Status Achieved   Patient will be able to return demonstrate and/or verbalize understanding of the post-op home exercise program related to regaining shoulder range of motion.   Time 1   Period Days   Status Achieved   Patient will be able to verbalize understanding of the importance of attending the postoperative After Breast Cancer Class for further lymphedema risk reduction education and therapeutic exercise.   Time  1   Period Days   Status Achieved              Plan - 03/19/15 1155    Clinical Impression Statement Patient was diagnosed on 03/04/15 with right lower outer quadrant breast cancer.  It is ER/PR positive, HER2 negative, grade 1, with a Ki67 of 5%.  It measures 7 mm in size.  She is planning to have a right lumpectomy with a sentinel node biopsy with Oncotype testing followed by radiation and anti-estrogen therapy.  She may benefit from post op PT to regain shoulder ROM and strength and learn lymphedema risk reduction.   Pt will benefit from skilled therapeutic intervention in order to improve on the following deficits Decreased range of motion;Increased fascial restricitons;Impaired UE functional use;Pain;Decreased knowledge of precautions;Decreased strength   Rehab Potential Excellent   Clinical Impairments Affecting Rehab Potential none   PT Frequency One time visit   PT Treatment/Interventions Therapeutic exercise;Patient/family education   Consulted and Agree with Plan of Care Patient;Family member/caregiver   Family Member Consulted Husband     Patient will follow up at outpatient cancer rehab if needed following surgery.  If the patient requires physical therapy at that time, a specific plan will be dictated and sent to the referring physician for approval. The patient was educated today on appropriate basic range of motion exercises to begin post operatively and the importance of attending the After Breast Cancer class following surgery.  Patient was educated today on lymphedema risk reduction practices as it pertains to recommendations that will benefit the patient immediately following surgery.  She verbalized good understanding.  No additional physical therapy is indicated at this time.       Problem List Patient Active Problem List   Diagnosis Date Noted  . At high risk for bleeding 03/19/2015  . 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 female breast 03/17/2015    Annia Friendly, PT 03/19/2015 12:02 PM  [place], [place], [address] Phone: [phone]   Fax:  [phone]

## 2015-03-21 ENCOUNTER — Encounter (HOSPITAL_BASED_OUTPATIENT_CLINIC_OR_DEPARTMENT_OTHER): Payer: Self-pay | Admitting: *Deleted

## 2015-03-24 ENCOUNTER — Telehealth: Payer: Self-pay | Admitting: *Deleted

## 2015-03-24 NOTE — Telephone Encounter (Signed)
Spoke to pt concerning [place] from 03/19/15. Denies questions or concerns regarding dx or treatment care plan. Encourage pt to call with needs. Received verbal understanding. Contact information given.

## 2015-03-25 ENCOUNTER — Ambulatory Visit
Admission: RE | Admit: 2015-03-25 | Discharge: 2015-03-25 | Disposition: A | Discharge status: Home / Self Care | Payer: BC Managed Care – PPO | Source: Ambulatory Visit | Attending: General Surgery | Admitting: General Surgery

## 2015-03-25 DIAGNOSIS — C50511 Malignant neoplasm of lower-outer quadrant of right female breast: Secondary | ICD-10-CM

## 2015-03-26 ENCOUNTER — Ambulatory Visit (HOSPITAL_BASED_OUTPATIENT_CLINIC_OR_DEPARTMENT_OTHER): Payer: BC Managed Care – PPO | Admitting: Certified Registered"

## 2015-03-26 ENCOUNTER — Ambulatory Visit
Admission: RE | Admit: 2015-03-26 | Discharge: 2015-03-26 | Disposition: A | Discharge status: Home / Self Care | Payer: BC Managed Care – PPO | Source: Ambulatory Visit | Attending: General Surgery | Admitting: General Surgery

## 2015-03-26 ENCOUNTER — Encounter (HOSPITAL_BASED_OUTPATIENT_CLINIC_OR_DEPARTMENT_OTHER): Payer: Self-pay | Admitting: Certified Registered"

## 2015-03-26 ENCOUNTER — Encounter (HOSPITAL_BASED_OUTPATIENT_CLINIC_OR_DEPARTMENT_OTHER)
Admission: RE | Disposition: A | Discharge status: Home / Self Care | Payer: Self-pay | Source: Ambulatory Visit | Attending: General Surgery

## 2015-03-26 ENCOUNTER — Ambulatory Visit (HOSPITAL_COMMUNITY)
Admission: RE | Admit: 2015-03-26 | Discharge: 2015-03-26 | Disposition: A | Discharge status: Home / Self Care | Payer: BC Managed Care – PPO | Source: Ambulatory Visit | Attending: General Surgery | Admitting: General Surgery

## 2015-03-26 ENCOUNTER — Ambulatory Visit (HOSPITAL_BASED_OUTPATIENT_CLINIC_OR_DEPARTMENT_OTHER)
Admission: RE | Admit: 2015-03-26 | Discharge: 2015-03-26 | Disposition: A | Discharge status: Home / Self Care | Payer: BC Managed Care – PPO | Source: Ambulatory Visit | Attending: General Surgery | Admitting: General Surgery

## 2015-03-26 DIAGNOSIS — F418 Other specified anxiety disorders: Secondary | ICD-10-CM | POA: Diagnosis not present

## 2015-03-26 DIAGNOSIS — D0511 Intraductal carcinoma in situ of right breast: Secondary | ICD-10-CM | POA: Insufficient documentation

## 2015-03-26 DIAGNOSIS — Z170 Estrogen receptor positive status [ER+]: Secondary | ICD-10-CM | POA: Insufficient documentation

## 2015-03-26 DIAGNOSIS — C50511 Malignant neoplasm of lower-outer quadrant of right female breast: Secondary | ICD-10-CM

## 2015-03-26 HISTORY — DX: Disease of blood and blood-forming organs, unspecified: D75.9

## 2015-03-26 HISTORY — PX: BREAST LUMPECTOMY WITH RADIOACTIVE SEED AND SENTINEL LYMPH NODE BIOPSY: SHX6550

## 2015-03-26 LAB — VON WILLEBRAND FACTOR MULTIMER
Factor-VIII Activity: 91 % (ref 50–180)
RISTOCETIN CO-FACTOR: 64 % (ref 42–200)
VON WILLEBRAND FACTOR AG: 77 % (ref 50–217)

## 2015-03-26 LAB — FACTOR 8 RISTOCETIN COFACTOR: RISTOCETIN CO-FACTOR, PLASMA: 64 % (ref 42–200)

## 2015-03-26 LAB — APTT: APTT: 34 s (ref 24–37)

## 2015-03-26 SURGERY — BREAST LUMPECTOMY WITH RADIOACTIVE SEED AND SENTINEL LYMPH NODE BIOPSY
Anesthesia: General | Site: Breast | Laterality: Right

## 2015-03-26 MED ORDER — DEXAMETHASONE SODIUM PHOSPHATE 10 MG/ML IJ SOLN
INTRAMUSCULAR | Status: AC
  Filled 2015-03-26: qty 1

## 2015-03-26 MED ORDER — PROMETHAZINE HCL 25 MG/ML IJ SOLN: 6.2500 mg | INTRAMUSCULAR | Status: DC | PRN

## 2015-03-26 MED ORDER — MIDAZOLAM HCL 2 MG/2ML IJ SOLN
1.0000 mg | INTRAMUSCULAR | Status: DC | PRN
  Administered 2015-03-26 (×3): 1 mg via INTRAVENOUS

## 2015-03-26 MED ORDER — PROPOFOL 10 MG/ML IV BOLUS
INTRAVENOUS | Status: DC | PRN
  Administered 2015-03-26: 140 mg via INTRAVENOUS

## 2015-03-26 MED ORDER — METHYLENE BLUE 1 % INJ SOLN
INTRAMUSCULAR | Status: AC
  Filled 2015-03-26: qty 10

## 2015-03-26 MED ORDER — MIDAZOLAM HCL 2 MG/2ML IJ SOLN
INTRAMUSCULAR | Status: AC
  Filled 2015-03-26: qty 2

## 2015-03-26 MED ORDER — LIDOCAINE HCL (CARDIAC) 20 MG/ML IV SOLN
INTRAVENOUS | Status: DC | PRN
  Administered 2015-03-26: 60 mg via INTRAVENOUS

## 2015-03-26 MED ORDER — ONDANSETRON HCL 4 MG/2ML IJ SOLN
INTRAMUSCULAR | Status: AC
  Filled 2015-03-26: qty 2

## 2015-03-26 MED ORDER — FENTANYL CITRATE (PF) 100 MCG/2ML IJ SOLN
50.0000 ug | INTRAMUSCULAR | Status: AC | PRN
  Administered 2015-03-26: 50 ug via INTRAVENOUS
  Administered 2015-03-26: 25 ug via INTRAVENOUS
  Administered 2015-03-26: 50 ug via INTRAVENOUS
  Administered 2015-03-26: 25 ug via INTRAVENOUS

## 2015-03-26 MED ORDER — CEFAZOLIN SODIUM-DEXTROSE 2-3 GM-% IV SOLR
2.0000 g | INTRAVENOUS | Status: AC
  Administered 2015-03-26: 2 g via INTRAVENOUS

## 2015-03-26 MED ORDER — LIDOCAINE HCL (CARDIAC) 20 MG/ML IV SOLN
INTRAVENOUS | Status: AC
  Filled 2015-03-26: qty 5

## 2015-03-26 MED ORDER — BUPIVACAINE-EPINEPHRINE (PF) 0.5% -1:200000 IJ SOLN
INTRAMUSCULAR | Status: DC | PRN
  Administered 2015-03-26: 25 mL

## 2015-03-26 MED ORDER — ONDANSETRON HCL 4 MG/2ML IJ SOLN
INTRAMUSCULAR | Status: DC | PRN
  Administered 2015-03-26: 4 mg via INTRAVENOUS

## 2015-03-26 MED ORDER — ACETAMINOPHEN 650 MG RE SUPP: 650.0000 mg | RECTAL | Status: DC | PRN

## 2015-03-26 MED ORDER — LIDOCAINE-EPINEPHRINE (PF) 1 %-1:200000 IJ SOLN
INTRAMUSCULAR | Status: AC
  Filled 2015-03-26: qty 30

## 2015-03-26 MED ORDER — HYDROMORPHONE HCL 1 MG/ML IJ SOLN
INTRAMUSCULAR | Status: AC
  Filled 2015-03-26: qty 1

## 2015-03-26 MED ORDER — BUPIVACAINE HCL (PF) 0.25 % IJ SOLN
INTRAMUSCULAR | Status: DC | PRN
  Administered 2015-03-26: 12 mL

## 2015-03-26 MED ORDER — LACTATED RINGERS IV SOLN
INTRAVENOUS | Status: DC
  Administered 2015-03-26 (×2): via INTRAVENOUS

## 2015-03-26 MED ORDER — SODIUM CHLORIDE 0.9 % IJ SOLN
INTRAMUSCULAR | Status: AC
  Filled 2015-03-26: qty 10

## 2015-03-26 MED ORDER — SODIUM CHLORIDE 0.9 % IJ SOLN: 3.0000 mL | INTRAMUSCULAR | Status: DC | PRN

## 2015-03-26 MED ORDER — LACTATED RINGERS IV SOLN: INTRAVENOUS | Status: DC

## 2015-03-26 MED ORDER — OXYCODONE HCL 5 MG PO TABS
5.0000 mg | ORAL_TABLET | ORAL | Status: DC | PRN
  Administered 2015-03-26: 5 mg via ORAL

## 2015-03-26 MED ORDER — MEPERIDINE HCL 25 MG/ML IJ SOLN: 6.2500 mg | INTRAMUSCULAR | Status: DC | PRN

## 2015-03-26 MED ORDER — FENTANYL CITRATE (PF) 100 MCG/2ML IJ SOLN
INTRAMUSCULAR | Status: AC
  Filled 2015-03-26: qty 2

## 2015-03-26 MED ORDER — OXYCODONE-ACETAMINOPHEN 5-325 MG PO TABS: 1.0000 | ORAL_TABLET | ORAL | Status: DC | PRN

## 2015-03-26 MED ORDER — FENTANYL CITRATE (PF) 100 MCG/2ML IJ SOLN
INTRAMUSCULAR | Status: AC
  Filled 2015-03-26: qty 4

## 2015-03-26 MED ORDER — CEFAZOLIN SODIUM-DEXTROSE 2-3 GM-% IV SOLR
INTRAVENOUS | Status: AC
  Filled 2015-03-26: qty 50

## 2015-03-26 MED ORDER — SODIUM CHLORIDE 0.9 % IJ SOLN: 3.0000 mL | Freq: Two times a day (BID) | INTRAMUSCULAR | Status: DC

## 2015-03-26 MED ORDER — GLYCOPYRROLATE 0.2 MG/ML IJ SOLN
0.2000 mg | Freq: Once | INTRAMUSCULAR | Status: DC | PRN
Start: 2015-03-26 — End: 2015-03-26

## 2015-03-26 MED ORDER — SCOPOLAMINE 1 MG/3DAYS TD PT72: 1.0000 | MEDICATED_PATCH | Freq: Once | TRANSDERMAL | Status: DC | PRN

## 2015-03-26 MED ORDER — MIDAZOLAM HCL 2 MG/2ML IJ SOLN
INTRAMUSCULAR | Status: AC
  Filled 2015-03-26: qty 4

## 2015-03-26 MED ORDER — OXYCODONE HCL 5 MG PO TABS
ORAL_TABLET | ORAL | Status: AC
  Filled 2015-03-26: qty 1

## 2015-03-26 MED ORDER — SODIUM CHLORIDE 0.9 % IV SOLN: 250.0000 mL | INTRAVENOUS | Status: DC | PRN

## 2015-03-26 MED ORDER — ACETAMINOPHEN 325 MG PO TABS: 650.0000 mg | ORAL_TABLET | ORAL | Status: DC | PRN

## 2015-03-26 MED ORDER — HYDROMORPHONE HCL 1 MG/ML IJ SOLN
0.2500 mg | INTRAMUSCULAR | Status: DC | PRN
  Administered 2015-03-26 (×2): 0.5 mg via INTRAVENOUS

## 2015-03-26 MED ORDER — TECHNETIUM TC 99M SULFUR COLLOID FILTERED
1.0000 | Freq: Once | INTRAVENOUS | Status: AC | PRN
  Administered 2015-03-26: 1 via INTRADERMAL

## 2015-03-26 MED ORDER — DEXAMETHASONE SODIUM PHOSPHATE 4 MG/ML IJ SOLN
INTRAMUSCULAR | Status: DC | PRN
  Administered 2015-03-26: 10 mg via INTRAVENOUS

## 2015-03-26 SURGICAL SUPPLY — 69 items
ADH SKN CLS APL DERMABOND .7 (GAUZE/BANDAGES/DRESSINGS) ×2
APPLIER CLIP 9.375 MED OPEN (MISCELLANEOUS)
APR CLP MED 9.3 20 MLT OPN (MISCELLANEOUS)
BINDER BREAST LRG (GAUZE/BANDAGES/DRESSINGS) IMPLANT
BINDER BREAST MEDIUM (GAUZE/BANDAGES/DRESSINGS) IMPLANT
BINDER BREAST XLRG (GAUZE/BANDAGES/DRESSINGS) IMPLANT
BINDER BREAST XXLRG (GAUZE/BANDAGES/DRESSINGS) IMPLANT
BLADE HEX COATED 2.75 (ELECTRODE) ×2 IMPLANT
BLADE SURG 10 STRL SS (BLADE) ×2 IMPLANT
BLADE SURG 15 STRL LF DISP TIS (BLADE) ×1 IMPLANT
BLADE SURG 15 STRL SS (BLADE) ×2
BNDG COHESIVE 4X5 TAN STRL (GAUZE/BANDAGES/DRESSINGS) ×2 IMPLANT
CANISTER SUC SOCK COL 7IN (MISCELLANEOUS) IMPLANT
CANISTER SUCT 1200ML W/VALVE (MISCELLANEOUS) ×2 IMPLANT
CHLORAPREP W/TINT 26ML (MISCELLANEOUS) ×2 IMPLANT
CLIP APPLIE 9.375 MED OPEN (MISCELLANEOUS) IMPLANT
CLIP TI LARGE 6 (CLIP) ×2 IMPLANT
CLIP TI MEDIUM 6 (CLIP) ×2 IMPLANT
CLIP TI WIDE RED SMALL 6 (CLIP) IMPLANT
CLSR STERI-STRIP ANTIMIC 1/2X4 (GAUZE/BANDAGES/DRESSINGS) ×1 IMPLANT
COVER MAYO STAND STRL (DRAPES) ×2 IMPLANT
COVER PROBE W GEL 5X96 (DRAPES) ×2 IMPLANT
DECANTER SPIKE VIAL GLASS SM (MISCELLANEOUS) IMPLANT
DERMABOND ADVANCED (GAUZE/BANDAGES/DRESSINGS) ×2
DERMABOND ADVANCED .7 DNX12 (GAUZE/BANDAGES/DRESSINGS) ×1 IMPLANT
DEVICE DUBIN W/COMP PLATE 8390 (MISCELLANEOUS) ×2 IMPLANT
DRAPE UTILITY XL STRL (DRAPES) ×2 IMPLANT
DRSG PAD ABDOMINAL 8X10 ST (GAUZE/BANDAGES/DRESSINGS) IMPLANT
ELECT REM PT RETURN 9FT ADLT (ELECTROSURGICAL) ×2
ELECTRODE REM PT RTRN 9FT ADLT (ELECTROSURGICAL) ×1 IMPLANT
GLOVE BIO SURGEON STRL SZ 6 (GLOVE) ×2 IMPLANT
GLOVE BIOGEL M 7.0 STRL (GLOVE) ×1 IMPLANT
GLOVE BIOGEL PI IND STRL 6.5 (GLOVE) ×1 IMPLANT
GLOVE BIOGEL PI INDICATOR 6.5 (GLOVE) ×1
GLOVE EXAM NITRILE PF LG BLUE (GLOVE) ×1 IMPLANT
GLOVE SURG SS PI 6.0 STRL IVOR (GLOVE) ×1 IMPLANT
GLOVE SURG SS PI 6.5 STRL IVOR (GLOVE) ×1 IMPLANT
GOWN STRL REUS W/ TWL LRG LVL3 (GOWN DISPOSABLE) ×1 IMPLANT
GOWN STRL REUS W/TWL 2XL LVL3 (GOWN DISPOSABLE) ×2 IMPLANT
GOWN STRL REUS W/TWL LRG LVL3 (GOWN DISPOSABLE) ×2
KIT MARKER MARGIN INK (KITS) ×2 IMPLANT
NDL HYPO 25X1 1.5 SAFETY (NEEDLE) ×1 IMPLANT
NDL SAFETY ECLIPSE 18X1.5 (NEEDLE) IMPLANT
NEEDLE HYPO 18GX1.5 SHARP (NEEDLE)
NEEDLE HYPO 25X1 1.5 SAFETY (NEEDLE) ×2 IMPLANT
NS IRRIG 1000ML POUR BTL (IV SOLUTION) IMPLANT
PACK BASIN DAY SURGERY FS (CUSTOM PROCEDURE TRAY) ×2 IMPLANT
PACK UNIVERSAL I (CUSTOM PROCEDURE TRAY) ×2 IMPLANT
PENCIL BUTTON HOLSTER BLD 10FT (ELECTRODE) ×2 IMPLANT
SLEEVE SCD COMPRESS KNEE MED (MISCELLANEOUS) ×2 IMPLANT
SPONGE GAUZE 4X4 12PLY STER LF (GAUZE/BANDAGES/DRESSINGS) ×2 IMPLANT
SPONGE LAP 18X18 X RAY DECT (DISPOSABLE) ×3 IMPLANT
STAPLER VISISTAT 35W (STAPLE) IMPLANT
STOCKINETTE IMPERVIOUS LG (DRAPES) ×2 IMPLANT
STRIP CLOSURE SKIN 1/2X4 (GAUZE/BANDAGES/DRESSINGS) ×2 IMPLANT
SUT ETHILON 2 0 FS 18 (SUTURE) IMPLANT
SUT MNCRL AB 4-0 PS2 18 (SUTURE) ×2 IMPLANT
SUT MON AB 5-0 PS2 18 (SUTURE) IMPLANT
SUT SILK 2 0 SH (SUTURE) IMPLANT
SUT VIC AB 2-0 SH 27 (SUTURE) ×2
SUT VIC AB 2-0 SH 27XBRD (SUTURE) ×1 IMPLANT
SUT VIC AB 3-0 SH 27 (SUTURE) ×2
SUT VIC AB 3-0 SH 27X BRD (SUTURE) ×1 IMPLANT
SUT VIC AB 5-0 PS2 18 (SUTURE) IMPLANT
SYR CONTROL 10ML LL (SYRINGE) ×2 IMPLANT
TOWEL OR 17X24 6PK STRL BLUE (TOWEL DISPOSABLE) ×2 IMPLANT
TOWEL OR NON WOVEN STRL DISP B (DISPOSABLE) ×2 IMPLANT
TUBE CONNECTING 20X1/4 (TUBING) ×2 IMPLANT
YANKAUER SUCT BULB TIP NO VENT (SUCTIONS) IMPLANT

## 2015-03-26 NOTE — H&P (Signed)
Candace Campbell 03/19/2015 8:19 AM Location: [facility] Patient #: [id number] DOB: 05/21/1956 Undefined / Language: Candace Campbell / Race: White Female  History of Present Illness Candace Klein MD; 03/19/2015 11:08 AM) The patient is a 59 year old female who presents with breast cancer. Patient is a 59 yo F who is referred by Dr. Inda Campbell for a new diagnosis of right breast cancer. She had screening detected architectural distortion and mass seen on mammography. She previously ended up with a left breast lumpectomy in 2008 wtih Dr. Margot Campbell that ended up being a radial scar. She underwent core needle biopsy that showed grade 1 invasive ductal carcinoma that was ER/PR positive, Her 2 negative, Ki67 was 5%. She has a maternal grandmother that had breast cancer at age 28.  She is a G2P2 with first child at age 35. She is a non smoker and drinks 2-3 drinks per week. 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 Menarche was age 81. She is not pregnant. She used hormonal contraception for around 7-8 years. She is on hormone replacement now. She is up to date with her colonoscopy, bone density scan, and pap smear. She has had a hysterectomy.  Of note, she does have a lot of bleeding with procedures, and her daughter was diagnosed with von willibrands disease.  pathology Diagnosis Breast, right, needle core biopsy, 6:00 o'clock - INVASIVE DUCTAL CARCINOMA. - DUCTAL CARCINOMA IN SITU. Diagnostic mammogram IMPRESSION: 1. Highly suspicious mass in the right breast at 6 o'clock. 2. Innumerable cysts and clusters of cysts in the right breast. RECOMMENDATION: Ultrasound-guided biopsy of the suspicious mass in the right breast is recommended. This is scheduled for 03/13/2015 at 12:30 p.m. CBC is normal, CMET essentially normal except for CO2 of 30.    Other Problems Candace Slipper, RN; 03/19/2015 8:19 AM) Anxiety Disorder Breast Cancer Depression Gastroesophageal Reflux Disease General anesthesia -  complications Hemorrhoids Hypercholesterolemia Kidney Stone Lump In Breast Migraine Headache Other disease, cancer, significant illness  Past Surgical History Candace Slipper, RN; 03/19/2015 8:19 AM) Breast Biopsy Bilateral. multiple Foot Surgery Bilateral. Hysterectomy (not due to cancer) - Partial Oral Surgery Tonsillectomy  Diagnostic Studies History Candace Slipper, RN; 03/19/2015 8:19 AM) Colonoscopy 5-10 years ago Mammogram within last year  Allergies Candace Campbell, CMA; 03/19/2015 12:40 PM) Latex Exam Gloves *MEDICAL DEVICES AND SUPPLIES* Swelling. Sulfabenzamide *CHEMICALS*  Medication History Candace Campbell, CMA; 03/19/2015 12:40 PM) Medications Reconciled ALPRAZolam (0.25MG Tablet, Oral as directed) Active. Belsomra (20MG Tablet, Oral daily) Active. Brintellix (20MG Tablet, Oral daily) Active. Evekeo (10MG Tablet, Oral daily) Active. LaMICtal (200MG Tablet, Oral as directed) Active. Nasonex (50MCG/ACT Suspension, Nasal as directed) Active. PriLOSEC OTC (20MG Tablet DR, Oral prn) Active.  Social History Candace Slipper, RN; 03/19/2015 8:19 AM) Alcohol use Moderate alcohol use. No caffeine use No drug use Tobacco use Never smoker.  Family History Candace Slipper, RN; 03/19/2015 8:19 AM) Bleeding disorder Daughter. Breast Cancer Family Members In General. Cerebrovascular Accident Family Members In General. Colon Polyps Mother. Depression Daughter, Family Members In General, Sister. Diabetes Mellitus Family Members In General. Heart Disease Family Members In General, Mother. Heart disease in female family member before age 75 Hypertension Mother. Kidney Disease Daughter. Respiratory Condition Family Members In General. Seizure disorder Family Members In Candace Campbell, Son.  Pregnancy / Birth History Candace Slipper, RN; 03/19/2015 8:19 AM) Age at menarche 51 years. Age of menopause <46 Contraceptive History Oral contraceptives. Gravida  2 Irregular periods Maternal age 58-30 Para 2  Review of Systems Candace Slipper RN; 03/19/2015  8:19 AM) General Not Present- Appetite Loss, Chills, Fatigue, Fever, Night Sweats, Weight Gain and Weight Loss. Skin Present- Non-Healing Wounds and Ulcer. Not Present- Change in Wart/Mole, Dryness, Hives, Jaundice, New Lesions and Rash. HEENT Present- Hoarseness, Oral Ulcers and Wears glasses/contact lenses. Not Present- Earache, Hearing Loss, Nose Bleed, Ringing in the Ears, Seasonal Allergies, Sinus Pain, Sore Throat, Visual Disturbances and Yellow Eyes. Respiratory Present- Snoring. Not Present- Bloody sputum, Chronic Cough, Difficulty Breathing and Wheezing. Breast Present- Breast Mass. Not Present- Breast Pain, Nipple Discharge and Skin Changes. Cardiovascular Present- Swelling of Extremities. Not Present- Chest Pain, Difficulty Breathing Lying Down, Leg Cramps, Palpitations, Rapid Heart Rate and Shortness of Breath. Gastrointestinal Present- Bloating, Constipation and Indigestion. Not Present- Abdominal Pain, Bloody Stool, Change in Bowel Habits, Chronic diarrhea, Difficulty Swallowing, Excessive gas, Gets full quickly at meals, Hemorrhoids, Nausea, Rectal Pain and Vomiting. Female Genitourinary Not Present- Frequency, Nocturia, Painful Urination, Pelvic Pain and Urgency. Musculoskeletal Present- Muscle Pain and Muscle Weakness. Not Present- Back Pain, Joint Pain, Joint Stiffness and Swelling of Extremities. Neurological Not Present- Decreased Memory, Fainting, Headaches, Numbness, Seizures, Tingling, Tremor, Trouble walking and Weakness. Psychiatric Present- Anxiety, Change in Sleep Pattern and Depression. Not Present- Bipolar, Fearful and Frequent crying. Hematology Present- Easy Bruising and Excessive bleeding. Not Present- Gland problems, HIV and Persistent Infections.   Physical Exam Candace Klein MD; 03/19/2015 11:08 AM) General Mental Status-Alert. General Appearance-Consistent with  stated age. Hydration-Well hydrated. Voice-Normal.  Head and Neck Head-normocephalic, atraumatic with no lesions or palpable masses. Trachea-midline. Thyroid Gland Characteristics - normal size and consistency.  Eye Eyeball - Bilateral-Extraocular movements intact. Sclera/Conjunctiva - Bilateral-No scleral icterus.  Chest and Lung Exam Chest and lung exam reveals -quiet, even and easy respiratory effort with no use of accessory muscles and on auscultation, normal breath sounds, no adventitious sounds and normal vocal resonance. Inspection Chest Wall - Normal. Back - normal.  Breast Note: right breast larger than left. Bruising on lower right breast at site of biopsy. no palpable masses. breast tissue dense throughout. no nipple retraction or skin dimpling.   Cardiovascular Cardiovascular examination reveals -normal heart sounds, regular rate and rhythm with no murmurs and normal pedal pulses bilaterally.  Abdomen Inspection Inspection of the abdomen reveals - No Hernias. Palpation/Percussion Palpation and Percussion of the abdomen reveal - Soft, Non Tender, No Rebound tenderness, No Rigidity (guarding) and No hepatosplenomegaly. Auscultation Auscultation of the abdomen reveals - Bowel sounds normal.  Neurologic Neurologic evaluation reveals -alert and oriented x 3 with no impairment of recent or remote memory. Mental Status-Normal.  Musculoskeletal Global Assessment -Note: no gross deformities.  Normal Exam - Left-Upper Extremity Strength Normal and Lower Extremity Strength Normal. Normal Exam - Right-Upper Extremity Strength Normal and Lower Extremity Strength Normal.  Lymphatic Head & Neck  General Head & Neck Lymphatics: Bilateral - Description - Normal. Axillary  General Axillary Region: Bilateral - Description - Normal. Tenderness - Non Tender. Femoral & Inguinal  Generalized Femoral & Inguinal Lymphatics: Bilateral - Description -  No Generalized lymphadenopathy.    Assessment & Plan Candace Klein MD; 03/19/2015 11:15 AM) CARCINOMA OF LOWER-OUTER QUADRANT OF RIGHT FEMALE BREAST (C50.511) Impression: Patient has a new diagnosis of stage I right breast cancer. We discussed the pros and cons of MRI. Considering her horrible experience with MRI guided biopsy in the past, we are not going to do an MRI this go around.  We discussed that we may miss some cancers, but she may also end up with non cancerous biopsies that can  lead to mastectomy.  We discussed the process for seed localized right breast lumpectomy and sentinel node biopsy. i reviewed the timeline, recovery, risks, and benefits of surgery. I discussed risks of bleeding, infection, seroma, need for additional surgeries, cancer recurrence, etc.  I reviewed that she would likely get an oncotype and may need chemotherapy. She would then have external radiation as part of her plan.  She will need to stop her estrogen replacement. Current Plans  Pt Education - flb breast cancer surgery: discussed with patient and provided information. BLEEDING DIATHESIS (D69.9) Impression: Suspected von Willebrand's disease. Dr. Lindi Adie will review this with the patient and will likely test her. She may benefit from desmopressin prior to surgery.    Signed by Candace Klein, MD (03/20/2015 11:48 AM)

## 2015-03-26 NOTE — Anesthesia Postprocedure Evaluation (Signed)
  Anesthesia Post-op Note  Patient: Candace Campbell  Procedure(s) Performed: Procedure(s): BREAST LUMPECTOMY WITH RADIOACTIVE SEED AND SENTINEL LYMPH NODE BIOPSY (Right)  Patient Location: PACU  Anesthesia Type: General   Level of Consciousness: awake, alert  and oriented  Airway and Oxygen Therapy: Patient Spontanous Breathing  Post-op Pain: mild  Post-op Assessment: Post-op Vital signs reviewed  Post-op Vital Signs: Reviewed  Last Vitals:  Filed Vitals:   03/26/15 1330  BP: 135/68  Pulse: 65  Temp:   Resp: 19    Complications: No apparent anesthesia complications

## 2015-03-26 NOTE — Interval H&P Note (Signed)
History and Physical Interval Note:  03/26/2015 10:56 AM  Candace Campbell  has presented today for surgery, with the diagnosis of RIGHT BREAST CANCER  The various methods of treatment have been discussed with the patient and family. After consideration of risks, benefits and other options for treatment, the patient has consented to  Procedure(s): BREAST LUMPECTOMY WITH RADIOACTIVE SEED AND SENTINEL LYMPH NODE BIOPSY (Right) as a surgical intervention .  The patient's history has been reviewed, patient examined, no change in status, stable for surgery.  I have reviewed the patient's chart and labs.  Questions were answered to the patient's satisfaction.     BYERLY,FAERA

## 2015-03-26 NOTE — Op Note (Signed)
Right Breast Radioactive seed localized lumpectomy and sentinel lymph node biopsy  Indications: This patient presents with history of screening detected right breast cancer, lower outer quadrant, [id number]  Pre-operative Diagnosis: right breast cancer  Post-operative Diagnosis: right breast cancer  Surgeon: [name]   Anesthesia: General endotracheal anesthesia  ASA Class: 2  Procedure Details  The patient was seen in the Holding Room. The risks, benefits, complications, treatment options, and expected outcomes were discussed with the patient. The possibilities of bleeding, infection, the need for additional procedures, failure to diagnose a condition, and creating a complication requiring transfusion or operation were discussed with the patient. The patient concurred with the proposed plan, giving informed consent.  The site of surgery properly noted/marked. The patient was taken to Operating Room # 5, identified, and the procedure verified as Right Breast Seed Localized Lumpectomy with sentinel lymph node biopsy. A Time Out was held and the above information confirmed.  The Right arm, breast, and chest were prepped and draped in standard fashion. The lumpectomy was performed by creating an inferior circumareolar incision  over the previously placed radioactive seed.  Dissection was carried down to around the point of maximum signal intensity. The cautery was used to perform the dissection.  Hemostasis was achieved with cautery. The edges of the cavity were marked with large clips, with one each medial, lateral, inferior and superior, and two clips posteriorly.   The specimen was inked with the margin marker paint kit.   An additional inferior margin was taken.  The breast was meticulously examined for hemostasis given her prior history of bleeding after surgery.   Specimen radiography confirmed inclusion of the mammographic lesion, the clip, and the seed.  The background signal in the breast was  zero.  The wound was irrigated and closed with 3-0 vicryl in layers and 4-0 monocryl subcuticular suture.    Using a hand-held gamma probe, right axillary sentinel nodes were identified transcutaneously.  An oblique incision was created below the axillary hairline.  Dissection was carried through the clavipectoral fascia.  Four level two axillary sentinel nodes were removed.  Counts per second were 380, 450, 90, and 60.    The background count was 10 cps.  The wound was irrigated.  Hemostasis was achieved with cautery.  The axilla was irrigated and reevaluated for hemostasis meiculously  The axillary incision was closed with a 3-0 vicryl deep dermal interrupted sutures and a 4-0 monocryl subcuticular closure.    Sterile dressings were applied. At the end of the operation, all sponge, instrument, and needle counts were correct.  Findings: grossly clear surgical margins and no adenopathy.  Anterior margin is skin.  Estimated Blood Loss:  min         Specimens: Right breast lumpectomy, additional inferior margin and four axillary sentinel lymph nodes.             Complications:  None; patient tolerated the procedure well.         Disposition: PACU - hemodynamically stable.         Condition: stable

## 2015-03-26 NOTE — Anesthesia Preprocedure Evaluation (Addendum)
Anesthesia Evaluation  Patient identified by MRN, date of birth, ID band Patient awake    Reviewed: Allergy & Precautions, NPO status , Patient's Chart, lab work & pertinent test results  Airway Mallampati: I  TM Distance: >3 FB Neck ROM: Full    Dental  (+) Teeth Intact   Pulmonary neg pulmonary ROS,    breath sounds clear to auscultation       Cardiovascular negative cardio ROS   Rhythm:Regular Rate:Normal     Neuro/Psych Anxiety Depression negative neurological ROS     GI/Hepatic Neg liver ROS, GERD  ,  Endo/Other  negative endocrine ROS  Renal/GU negative Renal ROS  negative genitourinary   Musculoskeletal negative musculoskeletal ROS (+)   Abdominal   Peds negative pediatric ROS (+)  Hematology   Anesthesia Other Findings   Reproductive/Obstetrics negative OB ROS                            Lab Results  Component Value Date   WBC 4.1 03/19/2015   HGB 14.2 03/19/2015   HCT 42.7 03/19/2015   MCV 93.6 03/19/2015   PLT 215 03/19/2015   Lab Results  Component Value Date   CREATININE 0.8 03/19/2015   BUN 10.4 03/19/2015   NA 142 03/19/2015   K 3.7 03/19/2015   CL 104 03/02/2007   CO2 30* 03/19/2015   Lab Results  Component Value Date   INR 0.90* 03/19/2015   PROTIME 10.8 03/19/2015     Anesthesia Physical Anesthesia Plan  ASA: II  Anesthesia Plan: General   Post-op Pain Management: GA combined w/ Regional for post-op pain   Induction: Intravenous  Airway Management Planned: LMA  Additional Equipment:   Intra-op Plan:   Post-operative Plan: Extubation in OR  Informed Consent: I have reviewed the patients History and Physical, chart, labs and discussed the procedure including the risks, benefits and alternatives for the proposed anesthesia with the patient or authorized representative who has indicated his/her understanding and acceptance.   Dental advisory  given  Plan Discussed with: CRNA  Anesthesia Plan Comments:        Anesthesia Quick Evaluation

## 2015-03-26 NOTE — Anesthesia Procedure Notes (Addendum)
Anesthesia Regional Block:  Pectoralis block  Pre-Anesthetic Checklist: ,, timeout performed, Correct Patient, Correct Site, Correct Laterality, Correct Procedure, Correct Position, site marked, Risks and benefits discussed,  Surgical consent,  Pre-op evaluation,  At surgeon's request and post-op pain management  Laterality: Right  Prep: chloraprep       Needles:  Injection technique: Single-shot  Needle Type: Stimiplex     Needle Length: 5cm 5 cm Needle Gauge: 22 and 22 G    Additional Needles:  Procedures: ultrasound guided (picture in chart) Pectoralis block  Nerve Stimulator or Paresthesia:  Response: biceps flexion,   Additional Responses:   Narrative:  Start time: 03/26/2015 10:52 AM End time: 03/26/2015 11:00 AM Injection made incrementally with aspirations every 5 mL.  Performed by: Personally  Anesthesiologist: Suella Broad D  Additional Notes: Functioning IV was confirmed and monitors were applied.  A 38mm 22ga Stimuplex needle was used. Sterile prep and drape,hand hygiene and sterile gloves were used.  Negative aspiration and negative test dose prior to incremental administration of local anesthetic. The patient tolerated the procedure well.      Procedure Name: LMA Insertion Date/Time: 03/26/2015 11:28 AM Performed by: Baxter Flattery Pre-anesthesia Checklist: Patient identified, Emergency Drugs available, Suction available and Patient being monitored Patient Re-evaluated:Patient Re-evaluated prior to inductionOxygen Delivery Method: Circle System Utilized Preoxygenation: Pre-oxygenation with 100% oxygen Intubation Type: IV induction Ventilation: Mask ventilation without difficulty LMA: LMA inserted LMA Size: 3.0 Number of attempts: 1 Airway Equipment and Method: Bite block Placement Confirmation: positive ETCO2 and breath sounds checked- equal and bilateral Tube secured with: Tape Dental Injury: Teeth and Oropharynx as per pre-operative assessment

## 2015-03-26 NOTE — Progress Notes (Addendum)
Assisted Dr. Smith Robert with right, ultrasound guided, pectoralis block. Side rails up, monitors on throughout procedure. See vital signs in flow sheet. Tolerated Procedure well.

## 2015-03-26 NOTE — Transfer of Care (Signed)
Immediate Anesthesia Transfer of Care Note  Patient: Candace Campbell  Procedure(s) Performed: Procedure(s): BREAST LUMPECTOMY WITH RADIOACTIVE SEED AND SENTINEL LYMPH NODE BIOPSY (Right)  Patient Location: PACU  Anesthesia Type:GA combined with regional for post-op pain  Level of Consciousness: awake, sedated and responds to stimulation  Airway & Oxygen Therapy: Patient Spontanous Breathing and Patient connected to face mask oxygen  Post-op Assessment: Report given to RN, Post -op Vital signs reviewed and stable and Patient moving all extremities  Post vital signs: Reviewed and stable  Last Vitals:  Filed Vitals:   03/26/15 1016  BP: 142/78  Pulse: 66  Temp: 36.7 C  Resp: 20    Complications: No apparent anesthesia complications

## 2015-03-26 NOTE — Discharge Instructions (Addendum)
[facility] °Office Phone Number [phone] ° °BREAST BIOPSY/ PARTIAL MASTECTOMY: POST OP INSTRUCTIONS ° °Always review your discharge instruction sheet given to you by the facility where your surgery was performed. ° °IF YOU HAVE DISABILITY OR FAMILY LEAVE FORMS, YOU MUST BRING THEM TO THE OFFICE FOR PROCESSING.  DO NOT GIVE THEM TO YOUR DOCTOR. ° °1. A prescription for pain medication may be given to you upon discharge.  Take your pain medication as prescribed, if needed.  If narcotic pain medicine is not needed, then you may take acetaminophen (Tylenol) or ibuprofen (Advil) as needed. °2. Take your usually prescribed medications unless otherwise directed °3. If you need a refill on your pain medication, please contact your pharmacy.  They will contact our office to request authorization.  Prescriptions will not be filled after 5pm or on week-ends. °4. You should eat very light the first 24 hours after surgery, such as soup, crackers, pudding, etc.  Resume your normal diet the day after surgery. °5. Most patients will experience some swelling and bruising in the breast.  Ice packs and a good support bra will help.  Swelling and bruising can take several days to resolve.  °6. It is common to experience some constipation if taking pain medication after surgery.  Increasing fluid intake and taking a stool softener will usually help or prevent this problem from occurring.  A mild laxative (Milk of Magnesia or Miralax) should be taken according to package directions if there are no bowel movements after 48 hours. °7. Unless discharge instructions indicate otherwise, you may remove your bandages 48 hours after surgery, and you may shower at that time.  You may have steri-strips (small skin tapes) in place directly over the incision.  These strips should be left on the skin for 7-10 days.   Any sutures or staples will be removed at the office during your follow-up visit. °8. ACTIVITIES:  You may resume  regular daily activities (gradually increasing) beginning the next day.  Wearing a good support bra or sports bra (or the breast binder) minimizes pain and swelling.  You may have sexual intercourse when it is comfortable. °a. You may drive when you no longer are taking prescription pain medication, you can comfortably wear a seatbelt, and you can safely maneuver your car and apply brakes. °b. RETURN TO WORK:  __________1 week_______________ °9. You should see your doctor in the office for a follow-up appointment approximately two weeks after your surgery.  Your doctor’s nurse will typically make your follow-up appointment when she calls you with your pathology report.  Expect your pathology report 2-3 business days after your surgery.  You may call to check if you do not hear from us after three days. ° ° °WHEN TO CALL YOUR DOCTOR: °1. Fever over 101.0 °2. Nausea and/or vomiting. °3. Extreme swelling or bruising. °4. Continued bleeding from incision. °5. Increased pain, redness, or drainage from the incision. ° °The clinic staff is available to answer your questions during regular business hours.  Please don’t hesitate to call and ask to speak to one of the nurses for clinical concerns.  If you have a medical emergency, go to the nearest emergency room or call 911.  A surgeon from [facility] is always on call at the hospital. ° °For further questions, please visit [email or web address]  ° ° °Post Anesthesia Home Care Instructions ° °Activity: °Get plenty of rest for the remainder of the day. A responsible adult should stay with you for 24   hours following the procedure.  °For the next 24 hours, DO NOT: °-Drive a car °-Operate machinery °-Drink alcoholic beverages °-Take any medication unless instructed by your physician °-Make any legal decisions or sign important papers. ° °Meals: °Start with liquid foods such as gelatin or soup. Progress to regular foods as tolerated. Avoid greasy, spicy, heavy  foods. If nausea and/or vomiting occur, drink only clear liquids until the nausea and/or vomiting subsides. Call your physician if vomiting continues. ° °Special Instructions/Symptoms: °Your throat may feel dry or sore from the anesthesia or the breathing tube placed in your throat during surgery. If this causes discomfort, gargle with warm salt water. The discomfort should disappear within 24 hours. ° °If you had a scopolamine patch placed behind your ear for the management of post- operative nausea and/or vomiting: ° °1. The medication in the patch is effective for 72 hours, after which it should be removed.  Wrap patch in a tissue and discard in the trash. Wash hands thoroughly with soap and water. °2. You may remove the patch earlier than 72 hours if you experience unpleasant side effects which may include dry mouth, dizziness or visual disturbances. °3. Avoid touching the patch. Wash your hands with soap and water after contact with the patch. °  ° °

## 2015-03-27 ENCOUNTER — Encounter (HOSPITAL_BASED_OUTPATIENT_CLINIC_OR_DEPARTMENT_OTHER): Payer: Self-pay | Admitting: General Surgery

## 2015-03-29 ENCOUNTER — Emergency Department (HOSPITAL_COMMUNITY)
Admission: EM | Admit: 2015-03-29 | Discharge: 2015-03-29 | Disposition: A | Discharge status: Home / Self Care | Payer: BC Managed Care – PPO | Attending: Emergency Medicine | Admitting: Emergency Medicine

## 2015-03-29 ENCOUNTER — Encounter (HOSPITAL_COMMUNITY): Payer: Self-pay

## 2015-03-29 DIAGNOSIS — N644 Mastodynia: Secondary | ICD-10-CM

## 2015-03-29 DIAGNOSIS — Z862 Personal history of diseases of the blood and blood-forming organs and certain disorders involving the immune mechanism: Secondary | ICD-10-CM | POA: Diagnosis not present

## 2015-03-29 DIAGNOSIS — Z91040 Latex allergy status: Secondary | ICD-10-CM | POA: Diagnosis not present

## 2015-03-29 DIAGNOSIS — F329 Major depressive disorder, single episode, unspecified: Secondary | ICD-10-CM | POA: Diagnosis not present

## 2015-03-29 DIAGNOSIS — F419 Anxiety disorder, unspecified: Secondary | ICD-10-CM | POA: Diagnosis not present

## 2015-03-29 DIAGNOSIS — R609 Edema, unspecified: Secondary | ICD-10-CM

## 2015-03-29 DIAGNOSIS — Z853 Personal history of malignant neoplasm of breast: Secondary | ICD-10-CM | POA: Diagnosis not present

## 2015-03-29 DIAGNOSIS — Z79899 Other long term (current) drug therapy: Secondary | ICD-10-CM | POA: Insufficient documentation

## 2015-03-29 DIAGNOSIS — G8918 Other acute postprocedural pain: Secondary | ICD-10-CM | POA: Diagnosis not present

## 2015-03-29 LAB — CBC WITH DIFFERENTIAL/PLATELET
Basophils Absolute: 0 10*3/uL (ref 0.0–0.1)
Basophils Relative: 0 %
EOS ABS: 0.1 10*3/uL (ref 0.0–0.7)
EOS PCT: 1 %
HCT: 40.1 % (ref 36.0–46.0)
Hemoglobin: 13.5 g/dL (ref 12.0–15.0)
LYMPHS ABS: 1.4 10*3/uL (ref 0.7–4.0)
LYMPHS PCT: 12 %
MCH: 31.5 pg (ref 26.0–34.0)
MCHC: 33.7 g/dL (ref 30.0–36.0)
MCV: 93.5 fL (ref 78.0–100.0)
MONO ABS: 1 10*3/uL (ref 0.1–1.0)
MONOS PCT: 9 %
Neutro Abs: 9.3 10*3/uL — ABNORMAL HIGH (ref 1.7–7.7)
Neutrophils Relative %: 78 %
PLATELETS: 232 10*3/uL (ref 150–400)
RBC: 4.29 MIL/uL (ref 3.87–5.11)
RDW: 12.4 % (ref 11.5–15.5)
WBC: 11.7 10*3/uL — AB (ref 4.0–10.5)

## 2015-03-29 LAB — BASIC METABOLIC PANEL
Anion gap: 6 (ref 5–15)
BUN: 11 mg/dL (ref 6–20)
CALCIUM: 9.1 mg/dL (ref 8.9–10.3)
CO2: 31 mmol/L (ref 22–32)
CREATININE: 0.68 mg/dL (ref 0.44–1.00)
Chloride: 103 mmol/L (ref 101–111)
GFR calc Af Amer: >60 mL/min (ref >60)
GLUCOSE: 159 mg/dL — AB (ref 65–99)
POTASSIUM: 4.3 mmol/L (ref 3.5–5.1)
SODIUM: 140 mmol/L (ref 135–145)

## 2015-03-29 MED ORDER — ONDANSETRON 8 MG PO TBDP: 8.0000 mg | ORAL_TABLET | Freq: Three times a day (TID) | ORAL | Status: DC | PRN

## 2015-03-29 MED ORDER — ONDANSETRON HCL 4 MG/2ML IJ SOLN
4.0000 mg | Freq: Once | INTRAMUSCULAR | Status: AC
  Administered 2015-03-29: 4 mg via INTRAVENOUS
  Filled 2015-03-29: qty 2

## 2015-03-29 MED ORDER — HYDROMORPHONE HCL 1 MG/ML IJ SOLN
1.0000 mg | Freq: Once | INTRAMUSCULAR | Status: AC
  Administered 2015-03-29: 1 mg via INTRAVENOUS
  Filled 2015-03-29: qty 1

## 2015-03-29 NOTE — Discharge Instructions (Signed)
We saw you in the ER for the swelling and pain around the R breast. We suspect post operative swelling and pain, with maybe some minor internal bleeding. Infection unlikely -and surgery team agrees.  IF THE PAIN IS GETTING WORSE, THERE IS FEVERS, SIGNIFICANT BLEEDING, PURULENT DISCHARGE - come to [facility] tomorrow (Dr. Barry Dienes is on call). Otherwise carry on with scheduled follow up and ice the area aggressively.  Pain Relief Preoperatively and Postoperatively Being a good patient does not mean being a silent one.If you have questions, problems, or concerns about the pain you may feel after surgery, let your caregiver know.Patients have the right to assessment and management of pain. The treatment of pain after surgery is important to speed up recovery and return to normal activities. Severe pain after surgery, and the fear or anxiety associated with that pain, may cause extreme discomfort that:  Prevents sleep.  Decreases the ability to breathe deeply and cough. This can cause pneumonia or other upper airway infections.  Causes your heart to beat faster and your blood pressure to be higher.  Increases the risk for constipation and bloating.  Decreases the ability of wounds to heal.  May result in depression, increased anxiety, and feelings of helplessness. Relief of pain before surgery is also important because it will lessen the pain after surgery. Patients who receive both pain relief before and after surgery experience greater pain relief than those who only receive pain relief after surgery. Let your caregiver know if you are having uncontrolled pain.This is very important.Pain after surgery is more difficult to manage if it is permitted to become severe, so prompt and adequate treatment of acute pain is necessary. PAIN CONTROL METHODS Your caregivers follow policies and procedures about the management of patient pain.These guidelines should be explained to you before surgery.Plans  for pain control after surgery must be mutually decided upon and instituted with your full understanding and agreement.Do not be afraid to ask questions regarding the care you are receiving.There are many different ways your caregivers will attempt to control your pain, including the following methods. As needed pain control  You may be given pain medicine either through your intravenous (IV) tube, or as a pill or liquid you can swallow. You will need to let your caregiver know when you are having pain. Then, your caregiver will give you the pain medicine ordered for you.  Your pain medicine may make you constipated. If constipation occurs, drink more liquids if you can. Your caregiver may have you take a mild laxative. IV patient-controlled analgesia pump (PCA pump)  You can get your pain medicine through the IV tube which goes into your vein. You are able to control the amount of pain medicine that you get. The pain medicine flows in through an IV tube and is controlled by a pump. This pump gives you a set amount of pain medicine when you push the button hooked up to it. Nobody should push this button but you or someone specifically assigned by you to do so. It is set up to keep you from accidentally giving yourself too much pain medicine. You will be able to start using your pain pump in the recovery room after your surgery. This method can be helpful for most types of surgery.  If you are still having too much pain, tell your caregiver. Also, tell your caregiver if you are feeling too sleepy or nauseous. Continuous epidural pain control  A thin, soft tube (catheter) is put into your  back. Pain medicine flows through the catheter to lessen pain in the part of your body where the surgery is done. Continuous epidural pain control may work best for you if you are having surgery on your chest, abdomen, hip area, or legs. The epidural catheter is usually put into your back just before surgery. The  catheter is left in until you can eat and take medicine by mouth. In most cases, this may take 2 to 3 days.  Giving pain medicine through the epidural catheter may help you heal faster because:  Your bowel gets back to normal faster.  You can get back to eating sooner.  You can be up and walking sooner. Medicine that numbs the area (local anesthetic)  You may receive an injection of pain medicine near where the pain is (local infiltration).  You may receive an injection of pain medicine near the nerve that controls the sensation to a specific part of the body (peripheral nerve block).  Medicine may be put in the spine to block pain (spinal block). Opioids  Moderate to moderately severe acute pain after surgery may respond to opioids.Opioids are narcotic pain medicine. Opioids are often combined with non-narcotic medicines to improve pain relief, diminish the risk of side effects, and reduce the chance of addiction.  If you follow your caregiver's directions about taking opioids and you do not have a history of substance abuse, your risk of becoming addicted is exceptionally small.Opioids are given for short periods of time in careful doses to prevent addiction. Other methods of pain control include:  Steroids.  Physical therapy.  Heat and cold therapy.  Compression, such as wrapping an elastic bandage around the area of pain.  Massage. These various ways of controlling pain may be used together. Combining different methods of pain control is called multimodal analgesia. Using this approach has many benefits, including being able to eat, move around, and leave the hospital sooner. Document Released: 09/11/2002 Document Revised: 09/13/2011 Document Reviewed: 09/15/2010 [facility] Patient Information 2015 [place], [place]. This information is not intended to replace advice given to you by your health care provider. Make sure you discuss any questions you have with your health care  provider.  Edema Edema is an abnormal buildup of fluids in your bodytissues. Edema is somewhatdependent on gravity to pull the fluid to the lowest place in your body. That makes the condition more common in the legs and thighs (lower extremities). Painless swelling of the feet and ankles is common and becomes more likely as you get older. It is also common in looser tissues, like around your eyes.  When the affected area is squeezed, the fluid may move out of that spot and leave a dent for a few moments. This dent is called pitting.  CAUSES  There are many possible causes of edema. Eating too much salt and being on your feet or sitting for a long time can cause edema in your legs and ankles. Hot weather may make edema worse. Common medical causes of edema include:  Heart failure.  Liver disease.  Kidney disease.  Weak blood vessels in your legs.  Cancer.  An injury.  Pregnancy.  Some medications.  Obesity. SYMPTOMS  Edema is usually painless.Your skin may look swollen or shiny.  DIAGNOSIS  Your health care provider may be able to diagnose edema by asking about your medical history and doing a physical exam. You may need to have tests such as X-rays, an electrocardiogram, or blood tests to check for medical  conditions that may cause edema.  TREATMENT  Edema treatment depends on the cause. If you have heart, liver, or kidney disease, you need the treatment appropriate for these conditions. General treatment may include:  Elevation of the affected body part above the level of your heart.  Compression of the affected body part. Pressure from elastic bandages or support stockings squeezes the tissues and forces fluid back into the blood vessels. This keeps fluid from entering the tissues.  Restriction of fluid and salt intake.  Use of a water pill (diuretic). These medications are appropriate only for some types of edema. They pull fluid out of your body and make you urinate more  often. This gets rid of fluid and reduces swelling, but diuretics can have side effects. Only use diuretics as directed by your health care provider. HOME CARE INSTRUCTIONS   Keep the affected body part above the level of your heart when you are lying down.   Do not sit still or stand for prolonged periods.   Do not put anything directly under your knees when lying down.  Do not wear constricting clothing or garters on your upper legs.   Exercise your legs to work the fluid back into your blood vessels. This may help the swelling go down.   Wear elastic bandages or support stockings to reduce ankle swelling as directed by your health care provider.   Eat a low-salt diet to reduce fluid if your health care provider recommends it.   Only take medicines as directed by your health care provider. SEEK MEDICAL CARE IF:   Your edema is not responding to treatment.  You have heart, liver, or kidney disease and notice symptoms of edema.  You have edema in your legs that does not improve after elevating them.   You have sudden and unexplained weight gain. SEEK IMMEDIATE MEDICAL CARE IF:   You develop shortness of breath or chest pain.   You cannot breathe when you lie down.  You develop pain, redness, or warmth in the swollen areas.   You have heart, liver, or kidney disease and suddenly get edema.  You have a fever and your symptoms suddenly get worse. MAKE SURE YOU:   Understand these instructions.  Will watch your condition.  Will get help right away if you are not doing well or get worse. Document Released: 06/21/2005 Document Revised: 11/05/2013 Document Reviewed: 04/13/2013 [facility] Patient Information 2015 [place], [place]. This information is not intended to replace advice given to you by your health care provider. Make sure you discuss any questions you have with your health care provider.

## 2015-03-29 NOTE — ED Notes (Signed)
She states she underwent breast lumpectomy this Wed. By Dr. Barry Dienes.  She states that this afternoon upon awakening from a nap she noted the involved breast to be "much larger than it was before my nap and very painful and tight".  She is quite pale in appearance and her skin is cool and moist; and she tells me she feels "like I'm going to pass out".

## 2015-03-29 NOTE — ED Provider Notes (Signed)
CSN: [id number]     Arrival date & time 03/29/15  1500 History   First MD Initiated Contact with Patient 03/29/15 1514     Chief Complaint  Patient presents with  . Breast Problem     (Consider location/radiation/quality/duration/timing/severity/associated sxs/prior Treatment) HPI Comments: Pt comes in with cc of breast pain. Pt is s/p R sided breast lumpectomy for a stage 1A cancer of the breast, pod #3. She reports that she was suffering through usual soreness until this morning, when the breast pain got intense and the swelling went up profusely. There is no n/v/f/c. Pt does feel some warmth in the area. She called the Surgery # but was prompted to come to the ER for further evaluation. Pt is immunocompetent.   ROS 10 Systems reviewed and are negative for acute change except as noted in the HPI.      The history is provided by the patient.    Past Medical History  Diagnosis Date  . Breast cancer   . Depression   . GERD (gastroesophageal reflux disease)   . Anxiety   . Blood dyscrasia     states daughter has Von Willebrand and she was tested 03-19-15 by Dr Lindi Adie   Past Surgical History  Procedure Laterality Date  . Abdominal hysterectomy    . Breast lumpectomy Left   . Breast lumpectomy with radioactive seed and sentinel lymph node biopsy Right 03/26/2015    Procedure: BREAST LUMPECTOMY WITH RADIOACTIVE SEED AND SENTINEL LYMPH NODE BIOPSY;  Surgeon: [name], MD;  Location: [facility];  Service: General;  Laterality: Right;   Family History  Problem Relation Age of Onset  . Breast cancer Maternal Grandmother    Social History  Substance Use Topics  . Smoking status: Never Smoker   . Smokeless tobacco: None  . Alcohol Use: Yes     Comment: social   OB History    No data available     Review of Systems  Constitutional: Positive for activity change.  Respiratory: Negative for shortness of breath.   Cardiovascular: Negative for chest pain.   Gastrointestinal: Negative for nausea, vomiting and abdominal pain.  Musculoskeletal: Negative for neck pain.  Skin: Positive for rash and wound.  Allergic/Immunologic: Negative for immunocompromised state.      Allergies  Latex; Peanut-containing drug products; Sulfa antibiotics; and Iodine  Home Medications   Prior to Admission medications   Medication Sig Start Date End Date Taking? Authorizing Provider  ALPRAZolam (XANAX) 0.25 MG tablet Take 0.25-0.5 mg by mouth 2 (two) times daily as needed for anxiety (take 2 tablets at bedtime as needed for sleep and 1 tablet in the day as needed).  03/18/15  Yes Historical Provider, MD  BRINTELLIX 20 MG TABS 20 mg in the morning daily 03/04/15  Yes Historical Provider, MD  EVEKEO 10 MG TABS Take 10 mg by mouth every morning.  03/18/15  Yes Historical Provider, MD  lamoTRIgine (LAMICTAL) 200 MG tablet Take 200 mg by mouth at bedtime.  03/18/15  Yes Historical Provider, MD  Multiple Vitamin (MULTIVITAMIN WITH MINERALS) TABS tablet Take 1 tablet by mouth daily.   Yes Historical Provider, MD  omeprazole (PRILOSEC OTC) 20 MG tablet Take 20 mg by mouth daily.   Yes Historical Provider, MD  omeprazole-sodium bicarbonate (ZEGERID) 40-1100 MG per capsule Take 1 capsule by mouth at bedtime. 03/05/15  Yes Historical Provider, MD  oxyCODONE-acetaminophen (ROXICET) 5-325 MG per tablet Take 1-2 tablets by mouth every 4 (four) hours as needed  for severe pain. 03/26/15  Yes [name], MD  Phosphatidylserine-DHA-EPA (VAYARIN PO) Take 2 tablets by mouth every morning.    Yes Historical Provider, MD  QNASL 80 MCG/ACT AERS Place 2 sprays into both nostrils daily. 03/26/15  Yes Historical Provider, MD  ESTRACE VAGINAL 0.1 MG/GM vaginal cream Place 1 application vaginally as needed. 03/04/15   Historical Provider, MD  MINIVELLE 0.0375 MG/24HR Place 1 patch onto the skin once a week. 03/13/15   Historical Provider, MD  ondansetron (ZOFRAN ODT) 8 MG disintegrating tablet Take  1 tablet (8 mg total) by mouth every 8 (eight) hours as needed for nausea. 03/29/15   Ankit Nanavati, MD   BP 133/79 mmHg  Pulse 66  Temp(Src) 98.2 F (36.8 C) (Oral)  Resp 18  SpO2 92% Physical Exam  Constitutional: She is oriented to person, place, and time. She appears well-developed.  HENT:  Head: Normocephalic and atraumatic.  Eyes: EOM are normal.  Neck: Normal range of motion. Neck supple.  Cardiovascular: Normal rate.   Pulmonary/Chest: Effort normal.  R breast is engorged with bruising noted in the dependent area and around the incision site. There is steristrip dressing on the actual wound -with no erythema surrounding. No drainage seen. Skin is slightly warm to touch.   Abdominal: Bowel sounds are normal.  Neurological: She is alert and oriented to person, place, and time.  Skin: Skin is warm and dry.  Nursing note and vitals reviewed.   ED Course  Procedures (including critical care time) Labs Review Labs Reviewed  CBC WITH DIFFERENTIAL/PLATELET - Abnormal; Notable for the following:    WBC 11.7 (*)    Neutro Abs 9.3 (*)    All other components within normal limits  BASIC METABOLIC PANEL - Abnormal; Notable for the following:    Glucose, Bld 159 (*)    All other components within normal limits    Imaging Review No results found. I have personally reviewed and evaluated these images and lab results as part of my medical decision-making.   EKG Interpretation None      MDM   Final diagnoses:  Post-op pain  Breast pain in female  Postoperative edema    Pt with breast pain. She is pod # 3. 0 SIRS on vitals. DDX: Normal post op edema, hematoma, early infection. Clinically, suspicion for abscess is low, especially given how acutely her symptoms got worse. The increased pain is due to the edema in the closed space. There could be a growing seroma as well. Will consult with the surgery team and f/u with their recs.   @5 :15: Dr. Lynford Humphrey clears pt for d/c.  Strict return precautions discussed.  Varney Biles, MD 03/29/15 1730

## 2015-03-29 NOTE — Consult Note (Signed)
  General Surgery [facility]  Called to [facility] ER to evaluate patient of Dr. Barry Dienes who is 3 days status post right partial mastectomy and sentinel lymph node biopsy.  Patient experienced swelling and pain in right breast earlier today.  No drainage.  No fever or chills.  On exam right breast with intact surgical incision with steristrips in place, no drainage.  Mild bronzing of surrounding skin but no erythema or induration.  Mild tenderness, soft, not tense.  Axillary wound intact with no sign of seroma.  Patient likely has significant seroma.  Patient states breast is softer now with elevation and ice pack.  Pain controlled with Dilaudid IV.  Recommend patient go home, remain upright or inclined, use ice packs, and not use binder (uncomfortable).  Will give Rx for Dilaudid tablets.  If pain and swelling recur, will call answering service - Dr. Barry Dienes on call at [facility] tomorrow.  Otherwise, I will ask [facility] office to contact patient on 9/26 and schedule evaluation at [place] office with Dr. Barry Dienes this week.  Patient and her husband are in agreement with this plan.  Earnstine Regal, MD, [facility] Office: [phone]

## 2015-03-29 NOTE — ED Notes (Signed)
Patient had a right lumpectomy with lymph node removal this past week.  Patient states she had some pain with surgical incision under right axilla, but had no pain prior to that.  Patient states she took a nap this afternoon and when she woke up, she had redness and swelling along right breast.  On exam, patient has area of induration along distal border of right breast.  Patient denies fever, but endorses nausea which she associates with the pain.

## 2015-04-01 NOTE — Assessment & Plan Note (Signed)
Rt Lumpectomy 03/26/15: 1.8 cm IDC grade 1, 0/6 LN negative, Er 100%, PR 70%, Her 2 Neg, Ki 67: 5% T1cN0 Stage 1A  Pathology Review: I reviewed the final path report and provided her with a copy of this report.  Recommendation: 1. Oncotype DX testing to determine if chemotherapy would be of any benefit followed by 2. Adjuvant radiation therapy followed by 3. Adjuvant antiestrogen therapy  RTC based on oncotype Dx results

## 2015-04-02 ENCOUNTER — Encounter: Payer: Self-pay | Admitting: *Deleted

## 2015-04-02 ENCOUNTER — Telehealth: Payer: Self-pay | Admitting: *Deleted

## 2015-04-02 ENCOUNTER — Ambulatory Visit (HOSPITAL_BASED_OUTPATIENT_CLINIC_OR_DEPARTMENT_OTHER): Payer: BC Managed Care – PPO | Admitting: Hematology and Oncology

## 2015-04-02 ENCOUNTER — Ambulatory Visit (HOSPITAL_BASED_OUTPATIENT_CLINIC_OR_DEPARTMENT_OTHER): Payer: BC Managed Care – PPO

## 2015-04-02 ENCOUNTER — Encounter: Payer: Self-pay | Admitting: Hematology and Oncology

## 2015-04-02 VITALS — BP 130/75 | HR 70 | Temp 98.1°F | Resp 18 | Ht 65.5 in | Wt 132.9 lb

## 2015-04-02 DIAGNOSIS — Z9189 Other specified personal risk factors, not elsewhere classified: Secondary | ICD-10-CM | POA: Diagnosis not present

## 2015-04-02 DIAGNOSIS — C50311 Malignant neoplasm of lower-inner quadrant of right female breast: Secondary | ICD-10-CM | POA: Diagnosis not present

## 2015-04-02 NOTE — Progress Notes (Signed)
Ordered oncotype per Dr. Lindi Adie.  Faxed PA to [facility] and faxed requisition to pathology and confirmed receipt.

## 2015-04-02 NOTE — Progress Notes (Signed)
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   Staging form: Breast, AJCC 7th Edition     Clinical stage from 03/19/2015: Stage IA (T1b, N0, M0) - Unsigned       Staging comments: Staged at breast conference on 9.14.16    SUMMARY OF ONCOLOGIC HISTORY:   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 female breast   03/04/2015 Mammogram Right breast possible distortion, ultrasound 03/11/2015 suspicious mass right breast 6:00 irregular hypoechoic mass 0.7 x 0.6 x 0.6 cm, adjacent to this innumerable cysts and clusters of cysts measuring 0.7 cm   03/13/2015 Initial Diagnosis Right breast biopsy: Invasive ductal carcinoma grade 1, ER/PR positive HER-2 negative Ki-67 5%, T1 BN0 clinical stage 1A   03/26/2015 Surgery Rt Lumpectomy: 1.8 cm IDC grade 1, 0/6 LN negative, Er 100%, PR 70%, Her 2 Neg, Ki 67: 5% T1cN0 Stage 1A    CHIEF COMPLIANT: Follow-up after surgery  INTERVAL HISTORY: Candace Campbell is a 41 year with above-mentioned history of right-sided breast cancer treated with lumpectomy. After lumpectomy her breasts enlarged and swollen. Dr. Barry Dienes drained fluid from the breast yesterday. There is a strong suspicion that she does have a bleeding disorder. Previous workup had revealed normal von Willebrand factor antigens and factor VIII levels. She continues to have extensive bruising throughout her life.  REVIEW OF SYSTEMS:    Constitutional: Denies fevers, chills or abnormal weight loss Eyes: Denies blurriness of vision Ears, nose, mouth, throat, and face: Denies mucositis or sore throat Respiratory: Denies cough, dyspnea or wheezes Cardiovascular: Denies palpitation, chest discomfort or lower extremity swelling Gastrointestinal:  Denies nausea, heartburn or change in bowel habits Skin: Denies abnormal skin rashes Lymphatics: Denies new lymphadenopathy or easy bruising Neurological:Denies numbness, tingling or new weaknesses Behavioral/Psych: Mood is stable, no new changes  Breast: Moderate swelling involving the right breast All other systems were reviewed with the patient and are negative.  I have reviewed the past medical history, past surgical history, social history and family history with the patient and they are unchanged from previous note.  ALLERGIES:  is allergic to latex; peanut-containing drug products; sulfa antibiotics; and iodine.  MEDICATIONS:  Current Outpatient Prescriptions  Medication Sig Dispense Refill  . ALPRAZolam (XANAX) 0.25 MG tablet Take 0.25-0.5 mg by mouth 2 (two) times daily as needed for anxiety (take 2 tablets at bedtime as needed for sleep and 1 tablet in the day as needed).     . BRINTELLIX 20 MG TABS 20 mg in the morning daily  1  . cephALEXin (KEFLEX) 500 MG capsule     . EVEKEO 10 MG TABS Take 10 mg by mouth every morning.     Marland Kitchen HYDROmorphone (DILAUDID) 2 MG tablet     . lamoTRIgine (LAMICTAL) 200 MG tablet Take 200 mg by mouth at bedtime.     . Multiple Vitamin (MULTIVITAMIN WITH MINERALS) TABS tablet Take 1 tablet by mouth daily.    Marland Kitchen omeprazole (PRILOSEC OTC) 20 MG tablet Take 20 mg by mouth daily.    Marland Kitchen  omeprazole-sodium bicarbonate (ZEGERID) 40-1100 MG per capsule Take 1 capsule by mouth at bedtime.  3  . ondansetron (ZOFRAN ODT) 8 MG disintegrating tablet Take 1 tablet (8 mg total) by mouth every 8 (eight) hours as needed for nausea. 20 tablet 0  .  Phosphatidylserine-DHA-EPA (VAYARIN PO) Take 2 tablets by mouth every morning.     [name] 80 MCG/ACT AERS Place 2 sprays into both nostrils daily.  6   No current facility-administered medications for this visit.    PHYSICAL EXAMINATION: ECOG PERFORMANCE STATUS: 1 - Symptomatic but completely ambulatory  Filed Vitals:   04/02/15 0939  BP: 130/75  Pulse: 70  Temp: 98.1 F (36.7 C)  Resp: 18   Filed Weights   04/02/15 0939  Weight: 132 lb 14.4 oz (60.283 kg)    GENERAL:alert, no distress and comfortable SKIN: Bruises EYES: normal, Conjunctiva are pink and non-injected, sclera clear OROPHARYNX:no exudate, no erythema and lips, buccal mucosa, and tongue normal  NECK: supple, thyroid normal size, non-tender, without nodularity LYMPH:  no palpable lymphadenopathy in the cervical, axillary or inguinal LUNGS: clear to auscultation and percussion with normal breathing effort HEART: regular rate & rhythm and no murmurs and no lower extremity edema ABDOMEN:abdomen soft, non-tender and normal bowel sounds Musculoskeletal:no cyanosis of digits and no clubbing  NEURO: alert & oriented x 3 with fluent speech, no focal motor/sensory deficits   LABORATORY DATA:  I have reviewed the data as listed   Chemistry      Component Value Date/Time   NA 140 03/29/2015 1619   NA 142 03/19/2015 0847   K 4.3 03/29/2015 1619   K 3.7 03/19/2015 0847   CL 103 03/29/2015 1619   CO2 31 03/29/2015 1619   CO2 30* 03/19/2015 0847   BUN 11 03/29/2015 1619   BUN 10.4 03/19/2015 0847   CREATININE 0.68 03/29/2015 1619   CREATININE 0.8 03/19/2015 0847      Component Value Date/Time   CALCIUM 9.1 03/29/2015 1619   CALCIUM 9.4 03/19/2015 0847   ALKPHOS 78 03/19/2015 0847   ALKPHOS 63 03/02/2007 1255   AST 18 03/19/2015 0847   AST 18 03/02/2007 1255   ALT 16 03/19/2015 0847   ALT 18 03/02/2007 1255   BILITOT 0.64 03/19/2015 0847   BILITOT 0.8 03/02/2007 1255       Lab Results  Component Value  Date   WBC 11.7* 03/29/2015   HGB 13.5 03/29/2015   HCT 40.1 03/29/2015   MCV 93.5 03/29/2015   PLT 232 03/29/2015   NEUTROABS 9.3* 03/29/2015   ASSESSMENT & PLAN:  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 female breast Rt Lumpectomy 03/26/15: 1.8 cm IDC grade 1, 0/6 LN negative, Er 100%, PR 70%, Her 2 Neg, Ki 67: 5% T1cN0 Stage 1A  Pathology Review: I reviewed the final path report and provided her with a copy of this report.  Recommendation: 1. Oncotype DX testing to determine if chemotherapy would be of any benefit followed by 2. Adjuvant radiation therapy followed by 3. Adjuvant antiestrogen therapy  High risk for bleeding: Patient was not found to have any von Willebrand's factor antigen abnormality or factor VIII deficiency. I would like to send for platelet aggregation study to evaluate if there is any platelet function defects. If that comes back normal, I might consider doing electron microscopy.  RTC based on oncotype Dx results  No orders of the defined types were placed in this encounter.   The patient has a good understanding of the  overall plan. she agrees with it. she will call with any problems that may develop before the next visit here.   Rulon Eisenmenger, MD

## 2015-04-02 NOTE — Telephone Encounter (Signed)
-----   Message from Mauro Kaufmann, RN sent at 03/24/2015  2:55 PM EDT ----- Regarding: [facility] Team,  Ms Majkowski was seen in clinic on 9/14. She is scheduled for the following.  Surgery 9/21 F/u Dr. Lindi Adie 9/28  We will send an oncotype pending her final path. Please let me know if you have questions.  Thanks, [employer]

## 2015-04-08 ENCOUNTER — Telehealth: Payer: Self-pay | Admitting: *Deleted

## 2015-04-08 ENCOUNTER — Encounter: Payer: Self-pay | Admitting: *Deleted

## 2015-04-08 NOTE — Telephone Encounter (Signed)
Spoke with patient today and informed her of the oncotype results and labwork she had done.  She will not need chemo and she is pleased.  Referral placed to see Dr. Pablo Ledger.  Encouraged her to call with any needs or concerns.

## 2015-04-08 NOTE — Progress Notes (Signed)
Received oncotype score of 16/10%.  Copy to medical records and copy to medical records.

## 2015-04-09 ENCOUNTER — Encounter (HOSPITAL_COMMUNITY): Payer: Self-pay

## 2015-04-09 LAB — PLATELET AGGREGATION STUDY, BLOOD
ADP10: NORMAL
ADP5: NORMAL
ARACHIDONIC ACID: NORMAL
COLLAGEN AGGR: NORMAL
COMMENT (PLATELET AGGREGATE): NORMAL
EPINEPHRINE 10: NORMAL
RISTOCETIN: NORMAL
Thrombin Receptor: NORMAL

## 2015-04-14 ENCOUNTER — Encounter: Payer: Self-pay | Admitting: *Deleted

## 2015-04-14 NOTE — Progress Notes (Signed)
Received labs from [facility], sent to scan.

## 2015-04-16 ENCOUNTER — Institutional Professional Consult (permissible substitution): Payer: Self-pay | Admitting: Radiation Oncology

## 2015-04-23 ENCOUNTER — Encounter: Payer: Self-pay | Admitting: Radiation Oncology

## 2015-04-23 NOTE — Progress Notes (Signed)
Location of Breast Cancer: Right Breast Lower Outer  Quadrant   Histology per Pathology Report: Diagnosis 03/13/2015: Breast, right, needle core biopsy, 6:00 o'clock - INVASIVE DUCTAL CARCINOMA.- DUCTAL CARCINOMA IN SITU.  Receptor Status: ER( 100%+  ), PR (  70%+) , Her2-neu ( neg KI-67 -5% )  Did patient present with symptoms (if so, please note symptoms) or was this found on screening mammography?: Screening mammogram  Past/Anticipated interventions by surgeon, if any:03/26/15: Diagnosis 1. Breast, lumpectomy, right - INVASIVE GRADE I DUCTAL CARCINOMA, SPANNING 1.8 CM IN GREATEST DIMENSION.- ASSOCIATED LOW GRADE DUCTAL CARCINOMA IN SITU AND MICROCALCIFICATIONS.- LOW GRADE DUCTAL CARCINOMA IN SITU IS FOCALLY 0.1 CM TO 0.2 CM AWAY FROM THE ANTERIOR MARGIN.- OTHER MARGINS ARE NEGATIVE.- SEE ONCOLOGY TEMPLATE. 2. Breast, excision, right additional inferior margin - BREAST PARENCHYMA WITH FIBROCYSTIC CHANGES SHOWING USUAL DUCTAL HYPERPLASIA.- MICROCALCIFICATIONS PRESENT (ASSOCIATED WITH FIBROCYSTIC CHANGES AND BENIGN DUCTS).- FAT NECROSIS. NO ATYPIA OR TUMOR SEEN.1 of 4 FINAL for [facility], Candace Campbell [phone]) Diagnosis(continued) 3. Lymph node, sentinel, biopsy, right - ONE BENIGN LYMPH NODE WITH NO TUMOR SEEN (0/1). 4. Lymph node, sentinel, biopsy, right - ONE BENIGN LYMPH NODE WITH NO TUMOR SEEN (0/1). 5. Lymph node, sentinel, biopsy, right - ONE BENIGN LYMPH NODE WITH NO TUMOR SEEN (0/1). 6. Lymph node, sentinel, biopsy, right - ONE BENIGN LYMPH NODE WITH NO TUMOR SEEN (0/1). 7. Lymph node, sentinel, biopsy, Right - ONE BENIGN LYMPH NODE WITH NO TUMOR SEEN (0/1). 8. Lymph node, sentinel, biopsy, Right - ONE BENIGN LYMPH NODE WITH NO TUMOR SEEN (0/1)      Past/Anticipated interventions by medical oncology, if any: Chemotherapy : Dr. Lindi Adie,  Oncotype DX=16. Candace Campbell states they have told her no chemotherapy is planned.   Lymphedema issues, if any: No  Pain issues, if any: Right breast  pain rated a 2/10, secondary to swelling after surgery 03/26/15. She saw Dr. Barry Dienes and she has drained her breast twice. This weekend she developed bleeding from incision site, called Dr. Marlowe Aschoff and they told her to put pressure, ice, and gauze on site and she reports she has had no bleeding since Monday night. She does not currently have her breast wrapped at this time.  SAFETY ISSUES:  Prior radiation? NO  Pacemaker/ICD? NO  Possible current pregnancy? NO Is the patient on methotrexate? NO Current Complaints / other details:  Seen in Breast Clinic , Married,  Menarche age 89, G32P2, 10st child  Born age 72, Birth control pills 7 years, estogen patches 5 years hysterectomy age 26non smoker, yes to alcohol, no to drug use, Hx left breast lumpectomy, Fibrocystic disease,,anxiety/depression   Maternal grandmother Breast cancer,  Allergies:Latex,Iodine, sulfa antibiotics, peanut containing drug products  She reports she is doing her exercises, and is not seeing PT. Her skin is hyperpigmented around her nipple area on her right breast. The incision is closed with no drainage noted at this time, the bleeding was occuring on the left side of the incision, but at this time dry skin is noted, with no redness.   BP 121/81 mmHg  Pulse 76  Temp(Src) 97.9 F (36.6 C)  Ht 5' 5.5" (1.664 m)  Wt 132 lb 9.6 oz (60.147 kg)  BMI 21.72 kg/m2   Wt Readings from Last 3 Encounters:  04/24/15 132 lb 9.6 oz (60.147 kg)  04/02/15 132 lb 14.4 oz (60.283 kg)  03/26/15 130 lb 6.4 oz (59.149 kg)      [name], RN 04/23/2015,12:29 PM

## 2015-04-24 ENCOUNTER — Encounter: Payer: Self-pay | Admitting: Radiation Oncology

## 2015-04-24 ENCOUNTER — Telehealth: Payer: Self-pay | Admitting: Radiation Oncology

## 2015-04-24 ENCOUNTER — Ambulatory Visit
Admission: RE | Admit: 2015-04-24 | Discharge: 2015-04-24 | Disposition: A | Discharge status: Home / Self Care | Payer: BC Managed Care – PPO | Source: Ambulatory Visit | Attending: Radiation Oncology | Admitting: Radiation Oncology

## 2015-04-24 VITALS — BP 121/81 | HR 76 | Temp 97.9°F | Ht 65.5 in | Wt 132.6 lb

## 2015-04-24 DIAGNOSIS — C50311 Malignant neoplasm of lower-inner quadrant of right female breast: Secondary | ICD-10-CM

## 2015-04-24 DIAGNOSIS — Z510 Encounter for antineoplastic radiation therapy: Secondary | ICD-10-CM | POA: Insufficient documentation

## 2015-04-24 DIAGNOSIS — Z170 Estrogen receptor positive status [ER+]: Secondary | ICD-10-CM | POA: Diagnosis not present

## 2015-04-24 DIAGNOSIS — Z9889 Other specified postprocedural states: Secondary | ICD-10-CM | POA: Insufficient documentation

## 2015-04-24 NOTE — Telephone Encounter (Signed)
Phoned patient on her cell. Confirmed simulation appointment for November 8th at 0900.

## 2015-04-24 NOTE — Progress Notes (Signed)
Department of Radiation Oncology  Phone:  [phone] Fax:        [phone]   Name: Candace Campbell MRN: [id number]  DOB: [birth year]  Date: 04/24/2015  Follow Up Visit Note  Diagnosis: 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 female breast [facility])   Staging form: Breast, AJCC 7th Edition     Clinical stage from 03/19/2015: Stage IA (T1b, N0, M0) - Unsigned       Staging comments: Staged at breast conference on 9.14.16  Summary and Interval since last radiation: No previous  Interval History: Candace Campbell presents today for routine follow-up appointment. A lumpectomy was completed on 03/26/2015. The patient required a trip to the OR for swelling and pain, after the lumpectomy. This was treated with ice and elevation. She had oncotype testing which was low, so she did not require chemotherapy. The final pathology on her surgery showed a 1.8cm Grade I invasive ductal carcinoma with a focally closed margin with DCIS. Six sentinel lymph nodes were found negative. She is ready to proceed with radiation. "Surgery was okay, afterwards wasn't so great. It got worse and then it got better," the patient stated in regards to her most recent surgery. "I bled inside. It happened three days after surgery. It drained, not all of it but some," the patient stated in regards to her pain. "I have anxiety," the patient stated, in regards to the fact that she just stopped bleeding yesterday. She was accompanied by her husband for today's visit.  Physical Exam:  Filed Vitals:   04/24/15 0928  BP: 121/81  Pulse: 76  Temp: 97.9 F (36.6 C)  Height: 5' 5.5" (1.664 m)  Weight: 132 lb 9.6 oz (60.147 kg)  The patient is alert and oriented. There is no significant changes to the status of overall health to be noted at this time. Breast: Large hematoma over the entire central aspect of the right breast. The incision is well healed.  IMPRESSION: Candace Campbell is a 59 y.o. female presenting to clinic in regards to her T1cN0  invasive ductal carcinoma of the right female breast. She understands that the breast takes longer to heal than other areas of the body and that based on physical examination that took place today, she requires a few more weeks time to heal before pursing radiation therapy treatments. She understands the risks, benefits, purpose, and tradiational process a patient is expected to experience when participating in radiation therapy treatments. The patient understands that she can access her appointments and medical records via [facility].   PLAN:  Upon physical exam, it was determined that the patient needs 2-3 additional weeks to heal before pursuing treatments. I spoke to the patient today regarding her diagnosis and options for treatment. We discussed the equivalence in terms of survival and local failure between mastectomy and breast conservation. We discussed the role of radiation in decreasing local failures in patients who undergo lumpectomy. We discussed the process of simulation and the placement tattoos. We discussed 4-6 weeks of treatment as an outpatient. We discussed the possibility of asymptomatic lung damage. We discussed the low likelihood of secondary malignancies. We discussed the possible side effects including but not limited to skin redness, fatigue, permanent skin darkening, and breast swelling. Her planning session will take place in two weeks time, Novemeber 8th, 2016. She reports that the morning time frame works better with her schedule in regards to radiation therapy treatments. All vocalized questions and concerns have been addressed. If the patient develops any further questions  or concerns in regards to her treatment and recovery, she has been encouraged to contact Dr. Pablo Ledger, MD.   This document serves as a record of services personally performed by Thea Silversmith , MD. It was created on her behalf by Lenn Cal, a trained medical scribe. The creation of this record is based  on the scribe's personal observations and the provider's statements to them. This document has been checked and approved by the attending provider.  ------------------------------------------------  Thea Silversmith, MD

## 2015-05-02 NOTE — Addendum Note (Signed)
Encounter addended by: Jacqulyn Liner, RN on: 05/02/2015 10:45 AM<BR>     Documentation filed: Charges VN

## 2015-05-13 ENCOUNTER — Ambulatory Visit
Admission: RE | Admit: 2015-05-13 | Discharge: 2015-05-13 | Disposition: A | Discharge status: Home / Self Care | Payer: BC Managed Care – PPO | Source: Ambulatory Visit | Attending: Radiation Oncology | Admitting: Radiation Oncology

## 2015-05-13 DIAGNOSIS — Z510 Encounter for antineoplastic radiation therapy: Secondary | ICD-10-CM | POA: Diagnosis not present

## 2015-05-13 DIAGNOSIS — C50311 Malignant neoplasm of lower-inner quadrant of right female breast: Secondary | ICD-10-CM

## 2015-05-13 NOTE — Progress Notes (Signed)
Name: [name]   MRN: [id number]  Date:  05/13/2015  DOB: [birth year]  Status:outpatient    DIAGNOSIS: Breast cancer.  CONSENT VERIFIED: yes   SET UP: Patient is setup supine   IMMOBILIZATION:  The following immobilization was used:Custom Moldable Pillow, breast board.   NARRATIVE: Ms. Candace Campbell was brought to the [facility].  Identity was confirmed.  All relevant records and images related to the planned course of therapy were reviewed.  Then, the patient was positioned in a stable reproducible clinical set-up for radiation therapy.  Wires were placed to delineate the clinical extent of breast tissue. A wire was placed on the scar as well.  CT images were obtained.  An isocenter was placed. Skin markings were placed.  The CT images were loaded into the planning software where the target and avoidance structures were contoured.  The radiation prescription was entered and confirmed. The patient was discharged in stable condition and tolerated simulation well.    TREATMENT PLANNING NOTE:  Treatment planning then occurred. I have requested : MLC's, isodose plan, basic dose calculation  I personally designed and supervised the construction of 3 medically necessary complex treatment devices for the protection of critical normal structures including the lungs and contralateral breast as well as the immobilization device which is necessary for set up certainty.   3D simulation occurred. I requested and analyzed a dose volume histogram of the heart, lungs and lumpectomy cavity.

## 2015-05-13 NOTE — Progress Notes (Signed)
Radiation Oncology         [phone] ________________________________  Name: Candace Campbell      MRN: [id number]          Date: 05/13/2015              DOB: [birth year]  Optical Surface Tracking Plan:  Since intensity modulated radiotherapy (IMRT) and 3D conformal radiation treatment methods are predicated on accurate and precise positioning for treatment, intrafraction motion monitoring is medically necessary to ensure accurate and safe treatment delivery.  The ability to quantify intrafraction motion without excessive ionizing radiation dose can only be performed with optical surface tracking. Accordingly, surface imaging offers the opportunity to obtain 3D measurements of patient position throughout IMRT and 3D treatments without excessive radiation exposure.  I am ordering optical surface tracking for this patient's upcoming course of radiotherapy. ________________________________ Signature   Reference:   Ursula Alert, J, et al. Surface imaging-based analysis of intrafraction motion for breast radiotherapy patients.Journal of [place], n. 6, nov. 2014. ISSN [id number].   Available at: <http://www.jacmp.org/index.php/jacmp/article/view/4957>.

## 2015-05-20 ENCOUNTER — Ambulatory Visit: Payer: BC Managed Care – PPO | Admitting: Radiation Oncology

## 2015-05-20 DIAGNOSIS — Z510 Encounter for antineoplastic radiation therapy: Secondary | ICD-10-CM | POA: Diagnosis not present

## 2015-05-21 ENCOUNTER — Ambulatory Visit: Payer: BC Managed Care – PPO

## 2015-05-21 DIAGNOSIS — Z510 Encounter for antineoplastic radiation therapy: Secondary | ICD-10-CM | POA: Diagnosis not present

## 2015-05-22 ENCOUNTER — Ambulatory Visit: Payer: BC Managed Care – PPO

## 2015-05-22 DIAGNOSIS — Z510 Encounter for antineoplastic radiation therapy: Secondary | ICD-10-CM | POA: Diagnosis not present

## 2015-05-23 ENCOUNTER — Ambulatory Visit: Payer: BC Managed Care – PPO

## 2015-05-23 DIAGNOSIS — Z510 Encounter for antineoplastic radiation therapy: Secondary | ICD-10-CM | POA: Diagnosis not present

## 2015-05-25 ENCOUNTER — Ambulatory Visit
Admission: RE | Admit: 2015-05-25 | Discharge: 2015-05-25 | Disposition: A | Discharge status: Home / Self Care | Payer: BC Managed Care – PPO | Source: Ambulatory Visit | Attending: Radiation Oncology | Admitting: Radiation Oncology

## 2015-05-25 ENCOUNTER — Encounter: Payer: Self-pay | Admitting: Radiation Oncology

## 2015-05-25 VITALS — BP 132/88 | HR 64 | Temp 97.7°F | Ht 65.5 in | Wt 132.5 lb

## 2015-05-25 DIAGNOSIS — C50311 Malignant neoplasm of lower-inner quadrant of right female breast: Secondary | ICD-10-CM

## 2015-05-25 DIAGNOSIS — Z510 Encounter for antineoplastic radiation therapy: Secondary | ICD-10-CM | POA: Diagnosis not present

## 2015-05-25 MED ORDER — RADIAPLEXRX EX GEL
Freq: Once | CUTANEOUS | Status: AC
  Administered 2015-05-25: 09:00:00 via TOPICAL

## 2015-05-25 MED ORDER — ALRA NON-METALLIC DEODORANT (RAD-ONC)
1.0000 "application " | Freq: Once | TOPICAL | Status: AC
  Administered 2015-05-25: 1 via TOPICAL

## 2015-05-25 NOTE — Addendum Note (Signed)
Encounter addended by: Benn Moulder, RN on: 05/25/2015  9:50 AM<BR>     Documentation filed: Notes Section, Inpatient Patient Education

## 2015-05-25 NOTE — Progress Notes (Addendum)
  Pt here for patient teaching.  Pt given Radiation and You booklet, skin care instructions, Alra deodorant and Radiaplex gel. Pt reports they have NOT WATCHED. Reviewed areas of pertinence such as fatigue, skin changes, breast tenderness and breast swelling . Pt able to give teach back of to pat skin, use unscented/gentle soap and drink plenty of water,apply Radiaplex bid, avoid applying anything to skin within 4 hours of treatment, avoid wearing an under wire bra and to use an electric razor if they must shave. Pt demonstrated understanding and verbalizes understanding of information given and will contact nursing with any questions or concerns.   video presented and watched.  Given Video Link: Http://rtanswers.org/treatmentinformation/whattoexpect/index

## 2015-05-25 NOTE — Addendum Note (Signed)
Encounter addended by: Benn Moulder, RN on: 05/25/2015 11:22 AM<BR>     Documentation filed: Visit Diagnoses, Orders, Dx Association, Inpatient MAR, Notes Section

## 2015-05-25 NOTE — Progress Notes (Signed)
Weekly Management Note Current Dose: 10.68  Gy  Projected Dose:42.72  Gy   Narrative:  The patient presents for routine under treatment assessment.  CBCT/MVCT images/Port film x-rays were reviewed.  The chart was checked. Breast is draining less now. Still using gauze  Physical Findings: Weight: 132 lb 8 oz (60.102 kg). Unchanged. Breast is tight. No drainage. No redness.  Impression:  The patient is tolerating radiation.  Plan:  Continue treatment as planned. Rn education performed.

## 2015-05-26 ENCOUNTER — Ambulatory Visit
Admission: RE | Admit: 2015-05-26 | Discharge: 2015-05-26 | Disposition: A | Discharge status: Home / Self Care | Payer: BC Managed Care – PPO | Source: Ambulatory Visit | Attending: Radiation Oncology | Admitting: Radiation Oncology

## 2015-05-26 DIAGNOSIS — Z510 Encounter for antineoplastic radiation therapy: Secondary | ICD-10-CM | POA: Diagnosis not present

## 2015-05-27 ENCOUNTER — Ambulatory Visit
Admission: RE | Admit: 2015-05-27 | Discharge: 2015-05-27 | Disposition: A | Discharge status: Home / Self Care | Payer: BC Managed Care – PPO | Source: Ambulatory Visit | Attending: Radiation Oncology | Admitting: Radiation Oncology

## 2015-05-27 ENCOUNTER — Encounter: Payer: Self-pay | Admitting: Radiation Oncology

## 2015-05-27 VITALS — BP 139/79 | HR 75 | Temp 98.0°F | Ht 65.5 in | Wt 132.4 lb

## 2015-05-27 DIAGNOSIS — Z510 Encounter for antineoplastic radiation therapy: Secondary | ICD-10-CM | POA: Diagnosis not present

## 2015-05-27 DIAGNOSIS — C50311 Malignant neoplasm of lower-inner quadrant of right female breast: Secondary | ICD-10-CM

## 2015-05-27 NOTE — Progress Notes (Addendum)
Candace Campbell has received 6 fracions ot her right breast.  Note mild erythema at this time. Reports soreness in the area of the hematoma. No other voiced concerns.

## 2015-05-27 NOTE — Progress Notes (Signed)
Weekly Management Note Current Dose: 16.02 Gy  Projected Dose:42.72  Gy   Narrative:  The patient presents for routine under treatment assessment.  CBCT/MVCT images/Port film x-rays were reviewed.  The chart was checked. No drainage. Feeling well.   Physical Findings: Weight: 132 lb 6.4 oz (60.056 kg). Unchanged. Breast is tight. No drainage. No redness.  Impression:  The patient is tolerating radiation.  Plan:  Continue treatment as planned. Continue radiaplex.

## 2015-05-28 ENCOUNTER — Ambulatory Visit
Admission: RE | Admit: 2015-05-28 | Discharge: 2015-05-28 | Disposition: A | Discharge status: Home / Self Care | Payer: BC Managed Care – PPO | Source: Ambulatory Visit | Attending: Radiation Oncology | Admitting: Radiation Oncology

## 2015-05-28 ENCOUNTER — Telehealth: Payer: Self-pay | Admitting: *Deleted

## 2015-05-28 DIAGNOSIS — Z510 Encounter for antineoplastic radiation therapy: Secondary | ICD-10-CM | POA: Diagnosis not present

## 2015-05-28 NOTE — Telephone Encounter (Signed)
Left message for a return phone call to follow up after starting radiation.

## 2015-06-02 ENCOUNTER — Ambulatory Visit
Admission: RE | Admit: 2015-06-02 | Discharge: 2015-06-02 | Disposition: A | Discharge status: Home / Self Care | Payer: BC Managed Care – PPO | Source: Ambulatory Visit | Attending: Radiation Oncology | Admitting: Radiation Oncology

## 2015-06-02 DIAGNOSIS — Z510 Encounter for antineoplastic radiation therapy: Secondary | ICD-10-CM | POA: Diagnosis not present

## 2015-06-03 ENCOUNTER — Ambulatory Visit
Admission: RE | Admit: 2015-06-03 | Discharge: 2015-06-03 | Disposition: A | Discharge status: Home / Self Care | Payer: BC Managed Care – PPO | Source: Ambulatory Visit | Attending: Radiation Oncology | Admitting: Radiation Oncology

## 2015-06-03 ENCOUNTER — Encounter: Payer: Self-pay | Admitting: Radiation Oncology

## 2015-06-03 VITALS — BP 137/80 | HR 66 | Temp 98.3°F | Ht 65.5 in | Wt 132.5 lb

## 2015-06-03 DIAGNOSIS — Z510 Encounter for antineoplastic radiation therapy: Secondary | ICD-10-CM | POA: Diagnosis not present

## 2015-06-03 DIAGNOSIS — C50311 Malignant neoplasm of lower-inner quadrant of right female breast: Secondary | ICD-10-CM

## 2015-06-03 NOTE — Progress Notes (Addendum)
Weekly Management Note Current Dose: 21.36 Gy  Projected Dose:42.72  Gy   Narrative:  The patient presents for routine under treatment assessment.  CBCT/MVCT images/Port film x-rays were reviewed.  The chart was checked. Still draining a small amount. Followed up with Dr. Barry Dienes yesterday.   Physical Findings: Weight: 132 lb 8 oz (60.102 kg). Unchanged. Breast is tight. No drainage. No redness.  Impression:  The patient is tolerating radiation.  Plan:  Continue treatment as planned. Continue radiaplex.

## 2015-06-03 NOTE — Progress Notes (Signed)
Candace Campbell has received 9 fractions.  Skin color looks good, having some drainage from the incision area red to yellow in color.  Using Radiaplex gel to breast.  Saw Dr. Norman Clay this morning for a follow up visit.  Energy level is pretty good.  Eating pattern is good. BP 137/80 mmHg  Pulse 66  Temp(Src) 98.3 F (36.8 C) (Oral)  Ht 5' 5.5" (1.664 m)  Wt 132 lb 8 oz (60.102 kg)  BMI 21.71 kg/m2  SpO2 100%  Wt Readings from Last 3 Encounters:  06/03/15 132 lb 8 oz (60.102 kg)  05/27/15 132 lb 6.4 oz (60.056 kg)  05/25/15 132 lb 8 oz (60.102 kg)

## 2015-06-04 ENCOUNTER — Ambulatory Visit
Admission: RE | Admit: 2015-06-04 | Discharge: 2015-06-04 | Disposition: A | Discharge status: Home / Self Care | Payer: BC Managed Care – PPO | Source: Ambulatory Visit | Attending: Radiation Oncology | Admitting: Radiation Oncology

## 2015-06-04 DIAGNOSIS — Z510 Encounter for antineoplastic radiation therapy: Secondary | ICD-10-CM | POA: Diagnosis not present

## 2015-06-05 ENCOUNTER — Ambulatory Visit
Admission: RE | Admit: 2015-06-05 | Discharge: 2015-06-05 | Disposition: A | Discharge status: Home / Self Care | Payer: BC Managed Care – PPO | Source: Ambulatory Visit | Attending: Radiation Oncology | Admitting: Radiation Oncology

## 2015-06-05 DIAGNOSIS — Z510 Encounter for antineoplastic radiation therapy: Secondary | ICD-10-CM | POA: Diagnosis not present

## 2015-06-06 ENCOUNTER — Ambulatory Visit: Admission: RE | Admit: 2015-06-06 | Payer: BC Managed Care – PPO | Source: Ambulatory Visit

## 2015-06-06 ENCOUNTER — Ambulatory Visit
Admission: RE | Admit: 2015-06-06 | Discharge: 2015-06-06 | Disposition: A | Discharge status: Home / Self Care | Payer: BC Managed Care – PPO | Source: Ambulatory Visit | Attending: Radiation Oncology | Admitting: Radiation Oncology

## 2015-06-06 ENCOUNTER — Encounter: Payer: Self-pay | Admitting: *Deleted

## 2015-06-06 VITALS — BP 131/88 | HR 86 | Temp 99.5°F

## 2015-06-06 DIAGNOSIS — C50311 Malignant neoplasm of lower-inner quadrant of right female breast: Secondary | ICD-10-CM

## 2015-06-06 NOTE — Progress Notes (Signed)
Mrs. Candace Campbell. Candace Campbell in today for her radiation treatment in a lot of pain, redness and swelling to right breast with serosangous drainage assessment completed  by Dr. Lanell Persons.   Temperature 99.5.   Recommends evaluation by her surgeon today of the right breast. Appointment made today with Dr. Donella Stade.

## 2015-06-06 NOTE — Progress Notes (Addendum)
Mrs. Candace Campbell. Wrenn in today for her radiation treatment in a lot of pain, redness and swelling to right breast with serosangous drainage assessment completed by Dr. Lanell Persons. Temperature 99.5. Recommends evaluation by her surgeon today of the right breast.  Appointment made today with Dr. Brantley Stage.  Inbasket send to Dr. Harlow Ohms. Pablo Ledger and Dr. Brantley Stage.

## 2015-06-06 NOTE — Progress Notes (Signed)
Weekly Management Note Current Dose: 29.37Gy  Projected Dose:42.72  Gy   Narrative:  The patient presents for routine under treatment assessment.  CBCT/MVCT images/Port film x-rays were reviewed.  The chart was checked. Feels ill; right breast warm, swollen   Physical Findings: Weight:  . Vitals with BMI 06/06/2015  Height   Weight   BMI   Systolic [id number]  Diastolic 88  Pulse 86  Respirations   Vitals - 1 value per visit [id number]  SYSTOLIC [id number]  DIASTOLIC 88  Pulse 86  Temperature 99.5  Respirations   Weight (lb)   Height   BMI   VISIT REPORT    Right Breast modestly erythematous.  Breast is swollen and warm  Impression:  Mastitis, possible seroma infection  Plan:  Hold RT today, send patient to be seen by surgeon.  -----------------------------------  Eppie Gibson, MD

## 2015-06-09 ENCOUNTER — Encounter: Payer: Self-pay | Admitting: Radiation Oncology

## 2015-06-09 ENCOUNTER — Ambulatory Visit
Admission: RE | Admit: 2015-06-09 | Discharge: 2015-06-09 | Disposition: A | Discharge status: Home / Self Care | Payer: BC Managed Care – PPO | Source: Ambulatory Visit | Attending: Radiation Oncology | Admitting: Radiation Oncology

## 2015-06-09 DIAGNOSIS — Z510 Encounter for antineoplastic radiation therapy: Secondary | ICD-10-CM | POA: Diagnosis not present

## 2015-06-10 ENCOUNTER — Ambulatory Visit
Admission: RE | Admit: 2015-06-10 | Discharge: 2015-06-10 | Disposition: A | Discharge status: Home / Self Care | Payer: BC Managed Care – PPO | Source: Ambulatory Visit | Attending: Radiation Oncology | Admitting: Radiation Oncology

## 2015-06-10 ENCOUNTER — Ambulatory Visit: Admission: RE | Admit: 2015-06-10 | Payer: BC Managed Care – PPO | Source: Ambulatory Visit

## 2015-06-10 DIAGNOSIS — C50311 Malignant neoplasm of lower-inner quadrant of right female breast: Secondary | ICD-10-CM

## 2015-06-10 NOTE — Progress Notes (Signed)
Weekly Management Note Current Dose: 32.04  Gy  Projected Dose: 42.72 Gy   Narrative: I saw Candace Campbell today. She still feels ill although improved form Friday. She sees Dr. Barry Dienes tomorrow. She has extreme pain in her breast. She is not febrile.   Physical Findings: Swollen red breast. erytema to above nipple and inframammary fold. Rest of skin is mildly pink.   Impression:  Mastitis. Improving but still present.   Plan:  Continue treatment as planned. Stop radiatplex. Continue antibiotics. Treatment on hold until Tuesday.

## 2015-06-10 NOTE — Addendum Note (Signed)
Encounter addended by: Thea Silversmith, MD on: 06/10/2015  8:29 AM<BR>     Documentation filed: Notes Section

## 2015-06-11 ENCOUNTER — Ambulatory Visit: Payer: BC Managed Care – PPO

## 2015-06-12 ENCOUNTER — Ambulatory Visit: Payer: BC Managed Care – PPO

## 2015-06-13 ENCOUNTER — Ambulatory Visit: Payer: BC Managed Care – PPO

## 2015-06-14 ENCOUNTER — Ambulatory Visit (INDEPENDENT_AMBULATORY_CARE_PROVIDER_SITE_OTHER): Payer: BC Managed Care – PPO | Admitting: Family Medicine

## 2015-06-14 VITALS — BP 122/88 | HR 76 | Temp 98.2°F | Ht 64.5 in | Wt 122.0 lb

## 2015-06-14 DIAGNOSIS — T7840XA Allergy, unspecified, initial encounter: Secondary | ICD-10-CM | POA: Diagnosis not present

## 2015-06-14 DIAGNOSIS — N611 Abscess of the breast and nipple: Secondary | ICD-10-CM | POA: Diagnosis not present

## 2015-06-14 MED ORDER — PREDNISONE 20 MG PO TABS: ORAL_TABLET | ORAL | Status: DC

## 2015-06-14 NOTE — Patient Instructions (Signed)
Take the prednisone 40 mg (2 tablets) daily for 2 days. Keep her appointment with the surgeon on Monday and continue to take the Keflex.  Also continue the warm compresses and express fluid from the wound on the right breast until the wound no longer produces purulent drainage

## 2015-06-14 NOTE — Progress Notes (Signed)
This a 59 year old woman who recently underwent lumpectomy for breast cancer and subsequently had radiation. She developed a postoperative infection and was put on clindamycin. Over the last several days she's developed a total body rash consistent with erythema multiforme. She was switched from clindamycin to Keflex and she's continued itch.  When she was on the clindamycin, the nature of the discharge from the wound seemed to be clearing. Nevertheless, the wound was I&D and thick purulent drainage was expressed. It was at that point that she was switched to Keflex because of the rash.  Objective: No acute distress Asian is seen with her husband BP 122/88 mmHg  Pulse 76  Temp(Src) 98.2 F (36.8 C) (Oral)  Ht 5' 4.5" (1.638 m)  Wt 122 lb (55.339 kg)  BMI 20.63 kg/m2  SpO2 98% Patient has diffuse erythematous symmetric rash suggestive of erythema multiforme over her abdomen and breasts. Right breast has diffuse erythema as well. There is a purulent sanguinous drainage from the right breast.  Assessment: Allergic reaction to the clindamycin. Because she developed a rash while on clindamycin, I believe that she can continue to take Keflex.   Plan: Continue the Keflex Prednisone 40 mg today and tomorrow to help with the rash Continued to put hot compresses on the abscess and express purulent drainage. Keep appointment with surgeon on Monday.  Robyn Haber M.D.

## 2015-06-16 ENCOUNTER — Ambulatory Visit: Payer: BC Managed Care – PPO

## 2015-06-16 ENCOUNTER — Ambulatory Visit: Payer: BC Managed Care – PPO | Admitting: Radiation Oncology

## 2015-06-17 ENCOUNTER — Telehealth: Payer: Self-pay | Admitting: *Deleted

## 2015-06-17 ENCOUNTER — Ambulatory Visit: Payer: BC Managed Care – PPO

## 2015-06-17 ENCOUNTER — Ambulatory Visit
Admission: RE | Admit: 2015-06-17 | Payer: BC Managed Care – PPO | Source: Ambulatory Visit | Admitting: Radiation Oncology

## 2015-06-17 NOTE — Telephone Encounter (Signed)
I called patient to see why she missed her appointment today.  She stated she had a infection to her right breast has seen Dr. Barry Dienes three times recently was put on Clindamycin had a reaction now has a total body rash.  Said Dr. Barry Dienes told her not to have Radiation until the rash is better. 12:31 PM

## 2015-06-18 ENCOUNTER — Telehealth: Payer: Self-pay | Admitting: *Deleted

## 2015-06-18 ENCOUNTER — Ambulatory Visit: Payer: BC Managed Care – PPO

## 2015-06-18 NOTE — Telephone Encounter (Signed)
Spoke with Mrs.[place] about coming in for a PUT visit next week Monday, 06-23-15 or Tuesday, 06-24-15.  Will see Dr. Barry Dienes on Monday, 06-23-15 will come in on Tuesday, 06-24-15.  Spoke with the treatment team for L3 145 pm time given and the time was given to the patient.

## 2015-06-19 ENCOUNTER — Ambulatory Visit: Payer: BC Managed Care – PPO

## 2015-06-19 ENCOUNTER — Other Ambulatory Visit: Payer: Self-pay | Admitting: Adult Health

## 2015-06-19 DIAGNOSIS — C50311 Malignant neoplasm of lower-inner quadrant of right female breast: Secondary | ICD-10-CM

## 2015-06-20 ENCOUNTER — Ambulatory Visit: Payer: BC Managed Care – PPO

## 2015-06-23 ENCOUNTER — Ambulatory Visit: Payer: BC Managed Care – PPO

## 2015-06-24 ENCOUNTER — Encounter: Payer: Self-pay | Admitting: Radiation Oncology

## 2015-06-24 ENCOUNTER — Ambulatory Visit
Admission: RE | Admit: 2015-06-24 | Discharge: 2015-06-24 | Disposition: A | Discharge status: Home / Self Care | Payer: BC Managed Care – PPO | Source: Ambulatory Visit | Attending: Radiation Oncology | Admitting: Radiation Oncology

## 2015-06-24 ENCOUNTER — Ambulatory Visit: Admission: RE | Admit: 2015-06-24 | Payer: BC Managed Care – PPO | Source: Ambulatory Visit

## 2015-06-24 VITALS — BP 120/82 | HR 75 | Temp 98.0°F | Ht 64.5 in

## 2015-06-24 DIAGNOSIS — C50311 Malignant neoplasm of lower-inner quadrant of right female breast: Secondary | ICD-10-CM

## 2015-06-24 NOTE — Progress Notes (Signed)
  Radiation Oncology         [phone] ________________________________  Name: Candace Campbell MRN: [id number]  Date: 06/24/2015  DOB: 04/04/1956  Weekly Radiation Therapy Management  Diagnosis: Right breast cancer  Current Dose: 32.04 Gy     Planned Dose:  42.72 Gy  Narrative . . . . . . . . The patient presents for routine under treatment assessment.                                   The patient is seen prior to her radiation therapy. The patient is been on break in light of development of infection within the right breast. She reports being seen by her surgeon and has been placed on additional week of antibiotic therapy. The patient's packing have been removed from the breast area.                                 Set-up films were reviewed.                                 The chart was checked. Physical Findings. . .  height is 5' 4.5" (1.638 m). Her temperature is 98 F (36.7 C). Her blood pressure is 120/82 and her pulse is 75. . The lungs are clear. The heart has a regular rhythm and rate. Examination of the right breast reveals  mild erythema near her lumpectomy scar. No open wound or drainage is noted at this time. Impression . . . . . . . The patient is tolerating radiation. Plan . . . . . . . . . . . . Patient is not quite ready to resume her radiation therapy. She will return next Tuesday, December 27 and will be seen prior to her  treatment by Dr. Pablo Ledger.  ________________________________   Blair Promise, PhD, MD

## 2015-06-25 ENCOUNTER — Ambulatory Visit: Payer: BC Managed Care – PPO

## 2015-06-26 ENCOUNTER — Ambulatory Visit: Payer: BC Managed Care – PPO

## 2015-06-27 ENCOUNTER — Ambulatory Visit: Payer: BC Managed Care – PPO | Attending: Radiation Oncology | Admitting: Radiation Oncology

## 2015-06-27 ENCOUNTER — Ambulatory Visit: Payer: BC Managed Care – PPO

## 2015-07-01 ENCOUNTER — Encounter: Payer: Self-pay | Admitting: Radiation Oncology

## 2015-07-01 ENCOUNTER — Ambulatory Visit: Admission: RE | Admit: 2015-07-01 | Payer: BC Managed Care – PPO | Source: Ambulatory Visit

## 2015-07-01 ENCOUNTER — Ambulatory Visit
Admission: RE | Admit: 2015-07-01 | Discharge: 2015-07-01 | Disposition: A | Discharge status: Home / Self Care | Payer: BC Managed Care – PPO | Source: Ambulatory Visit | Attending: Radiation Oncology | Admitting: Radiation Oncology

## 2015-07-01 VITALS — BP 124/78 | HR 65 | Temp 97.9°F | Ht 64.5 in | Wt 134.2 lb

## 2015-07-01 DIAGNOSIS — C50311 Malignant neoplasm of lower-inner quadrant of right female breast: Secondary | ICD-10-CM

## 2015-07-01 NOTE — Progress Notes (Signed)
Candace Campbell has received 12 fractions to her right breast.  Skin to right breast with redness says infection is better has not been using Radiplex Bid for two weeks was using Cortisone cream, had some yellow drainage this morning .  Candace Campbell remove the packing from the right breast at home on Sunday, 06-29-15.    Appeitte is good.  Energy level is low. Body  rash is much better, infection became worst last Thursday went back to the surgeon packed the right breast..  Went to the surgeon's office again today nurse has a call into Dr. Barry Dienes  For treatment plan, they think the infection is still present.  BP 124/78 mmHg  Pulse 65  Temp(Src) 97.9 F (36.6 C) (Oral)  Ht 5' 4.5" (1.638 m)  Wt 134 lb 3.2 oz (60.873 kg)  BMI 22.69 kg/m2  SpO2 100%

## 2015-07-01 NOTE — Progress Notes (Signed)
Weekly Management Note Current Dose: 32.04  Gy  Projected Dose: 42.72 Gy   Narrative:  Candace patient presents for routine under treatment assessment.  CBCT/MVCT images/Port film x-rays were reviewed.  Candace chart was checked.  Mrs. Candace Campbell has received 12 fractions to her right breast. Skin to right breast with redness and says Candace infection is better. Had some yellow drainage this morning. Candace infection became worst last Thursday went back to Candace surgeon who packed Candace right breast. Mrs. Candace Campbell remove Candace packing from Candace right breast at home on Campbell, CandaceWent to Candace surgeon's office again today and Candace nurse has a call into Dr. Barry Campbell. For treatment plan, they think Candace infection is still present. She is still on antibiotics and feeling better but wants to wait to see Dr. Barry Campbell before restarting RT as she feels Candace infection returned so quickly after Candace breast "closed up" last week.   Physical Findings: Weight: 134 lb 3.2 oz (60.873 kg). Left breast is hard with less erythema and no drainage. No signs of infection  Impression:  S/p I and D for breast abscess. Looks better  Plan:  Continue treatment as planned. I will contact Dr. Barry Campbell re: treatment plan.   This document serves as a record of services personally performed by Candace Silversmith, MD. It was created on her behalf by Candace Campbell, a trained medical scribe. Candace creation of this record is based on Candace scribe's personal observations and Candace provider's statements to them. This document has been checked and approved by Candace attending provider.

## 2015-07-02 ENCOUNTER — Ambulatory Visit: Payer: BC Managed Care – PPO

## 2015-07-03 ENCOUNTER — Ambulatory Visit: Payer: BC Managed Care – PPO

## 2015-07-04 ENCOUNTER — Ambulatory Visit: Payer: BC Managed Care – PPO

## 2015-07-04 ENCOUNTER — Encounter: Payer: Self-pay | Admitting: *Deleted

## 2015-07-05 ENCOUNTER — Telehealth: Payer: Self-pay | Admitting: Hematology and Oncology

## 2015-07-05 NOTE — Telephone Encounter (Signed)
Called and left a message with a follow up apointment

## 2015-07-08 ENCOUNTER — Ambulatory Visit
Admission: RE | Admit: 2015-07-08 | Payer: BC Managed Care – PPO | Source: Ambulatory Visit | Admitting: Radiation Oncology

## 2015-07-08 ENCOUNTER — Ambulatory Visit: Payer: BC Managed Care – PPO

## 2015-07-09 ENCOUNTER — Ambulatory Visit: Payer: BC Managed Care – PPO

## 2015-07-10 ENCOUNTER — Ambulatory Visit: Payer: BC Managed Care – PPO

## 2015-07-11 ENCOUNTER — Ambulatory Visit: Payer: BC Managed Care – PPO

## 2015-07-14 ENCOUNTER — Ambulatory Visit: Payer: BC Managed Care – PPO

## 2015-07-14 ENCOUNTER — Encounter: Payer: Self-pay | Admitting: *Deleted

## 2015-07-14 ENCOUNTER — Ambulatory Visit: Payer: BC Managed Care – PPO | Attending: Radiation Oncology | Admitting: Radiation Oncology

## 2015-07-14 NOTE — Progress Notes (Signed)
Called patient in regards to resuming radiation therapy.  Pt reports they are still having to pack her breast wound and is returning to Dr. Barry Dienes tomorrow 07/15/15 in the afternoon.  I requested the patient call the radiation department with an update following her appointment tomorrow.  Pt agreed she would.  Notified treatment machine L3 and spoke with Miranda RT.

## 2015-07-15 ENCOUNTER — Ambulatory Visit: Admission: RE | Admit: 2015-07-15 | Payer: BC Managed Care – PPO | Source: Ambulatory Visit

## 2015-07-15 ENCOUNTER — Encounter: Payer: Self-pay | Admitting: Radiation Oncology

## 2015-07-15 ENCOUNTER — Telehealth: Payer: Self-pay

## 2015-07-15 ENCOUNTER — Ambulatory Visit: Payer: BC Managed Care – PPO

## 2015-07-15 NOTE — Telephone Encounter (Signed)
Ms. Candace Campbell called today to inform Radiation Oncology that she saw her surgeon Dr. Barry Dienes today. Ms. Candace Campbell states she still has her breast infection and Dr. Barry Dienes is "keeping it open" and will evaluate her again in 2 weeks to determine how well she is healing. Dr. Barry Dienes does not want Ms. Candace Campbell to have radiation until she is able to evaluate her after that 2 week period. I asked her to keep [place] updated when she learns more about her condition.

## 2015-07-16 ENCOUNTER — Ambulatory Visit: Payer: BC Managed Care – PPO

## 2015-07-16 ENCOUNTER — Ambulatory Visit: Payer: BC Managed Care – PPO | Admitting: Radiation Oncology

## 2015-07-17 ENCOUNTER — Ambulatory Visit: Payer: BC Managed Care – PPO

## 2015-07-18 ENCOUNTER — Ambulatory Visit: Payer: BC Managed Care – PPO

## 2015-07-22 ENCOUNTER — Telehealth: Payer: Self-pay | Admitting: Hematology and Oncology

## 2015-07-22 ENCOUNTER — Ambulatory Visit (HOSPITAL_BASED_OUTPATIENT_CLINIC_OR_DEPARTMENT_OTHER): Payer: BC Managed Care – PPO | Admitting: Hematology and Oncology

## 2015-07-22 ENCOUNTER — Encounter: Payer: Self-pay | Admitting: Hematology and Oncology

## 2015-07-22 VITALS — BP 132/76 | HR 73 | Temp 98.7°F | Resp 18 | Wt 135.0 lb

## 2015-07-22 DIAGNOSIS — Z170 Estrogen receptor positive status [ER+]: Secondary | ICD-10-CM | POA: Diagnosis not present

## 2015-07-22 DIAGNOSIS — Z9189 Other specified personal risk factors, not elsewhere classified: Secondary | ICD-10-CM | POA: Diagnosis not present

## 2015-07-22 DIAGNOSIS — C50311 Malignant neoplasm of lower-inner quadrant of right female breast: Secondary | ICD-10-CM | POA: Diagnosis not present

## 2015-07-22 NOTE — Progress Notes (Signed)
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 [facility])   Staging form: Breast, AJCC 7th Edition     Clinical stage from 03/19/2015: Stage IA (T1b, N0, M0) - Unsigned       Staging comments: Staged at breast conference on 9.14.16  SUMMARY OF ONCOLOGIC HISTORY:   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 female breast ([place])   03/04/2015 Mammogram Right breast possible distortion, ultrasound 03/11/2015 suspicious mass right breast 6:00 irregular hypoechoic mass 0.7 x 0.6 x 0.6 cm, adjacent to this innumerable cysts and clusters of cysts measuring 0.7 cm   03/13/2015 Initial Diagnosis Right breast biopsy: Invasive ductal carcinoma grade 1, ER/PR positive HER-2 negative Ki-67 5%, T1 BN0 clinical stage 1A   03/26/2015 Surgery Rt Lumpectomy: 1.8 cm IDC grade 1, 0/6 LN negative, Er 100%, PR 70%, Her 2 Neg, Ki 67: 5% T1cN0 Stage 1A, Oncotype DX score 16, 10% ROR   05/26/2015 -  Radiation Therapy Adjuvant radiation therapy, treatment break because of wound infection    CHIEF COMPLIANT: right breast wound infection  INTERVAL HISTORY: Candace Campbell is a 60 year old with above-mentioned history right breast cancer who developed a hematoma after undergoing lumpectomy and has had multiple problems since then. She was started on adjuvant radiation 05/26/2015 but it had to be stopped when she had 5 more days of radiation left because of wound  infection complications. She has an open wound which is being packed currently.  REVIEW OF SYSTEMS:   Constitutional: Denies fevers, chills or abnormal weight loss Eyes: Denies blurriness of vision Ears, nose, mouth, throat, and face: Denies mucositis or sore throat Respiratory: Denies cough, dyspnea or wheezes Cardiovascular: Denies palpitation, chest discomfort Gastrointestinal:  Denies nausea, heartburn or change in bowel habits Skin: Denies abnormal skin rashes Lymphatics: Denies new lymphadenopathy or easy bruising Neurological:Denies numbness, tingling or new weaknesses Behavioral/Psych: Mood is stable, no new changes  Extremities: No lower extremity edema Breast: wound infection All other systems were reviewed with the patient and are negative.  I have reviewed the past medical history, past surgical history, social history and family history with the patient and they are unchanged from previous note.  ALLERGIES:  is allergic to latex; clindamycin/lincomycin; peanut-containing drug products; sulfa antibiotics; and iodine.  MEDICATIONS:  Current Outpatient Prescriptions  Medication Sig Dispense Refill  . ALPRAZolam (XANAX) 0.25 MG tablet Take 0.25-0.5 mg by mouth 2 (two) times daily as needed for anxiety (take 2 tablets at bedtime as needed for sleep and 1 tablet in the day as needed).     . ciprofloxacin (CIPRO) 750 MG tablet     . EVEKEO 10 MG TABS Take 10 mg by mouth every morning.     . lamoTRIgine (LAMICTAL) 200 MG tablet Take 200 mg by mouth at bedtime.     . Multiple Vitamin (MULTIVITAMIN WITH MINERALS) TABS tablet Take 1 tablet by mouth daily.    Marland Kitchen omeprazole (PRILOSEC OTC) 20 MG tablet Take 20 mg by mouth daily.    Marland Kitchen omeprazole-sodium bicarbonate (ZEGERID) 40-1100 MG  per capsule Take 1 capsule by mouth at bedtime.  3  . Phosphatidylserine-DHA-EPA (VAYARIN PO) Take 2 tablets by mouth every morning.     . predniSONE (DELTASONE) 20 MG tablet 2 daily with food 10 tablet 1    . QNASL 80 MCG/ACT AERS Place 2 sprays into both nostrils daily.  6  . Vortioxetine HBr (TRINTELLIX) 20 MG TABS Take 20 mg by mouth daily.     No current facility-administered medications for this visit.    PHYSICAL EXAMINATION: ECOG PERFORMANCE STATUS: 1 - Symptomatic but completely ambulatory  Filed Vitals:   07/22/15 1454  BP: 132/76  Pulse: 73  Temp: 98.7 F (37.1 C)  Resp: 18   Filed Weights   07/22/15 1454  Weight: 135 lb (61.236 kg)    GENERAL:alert, no distress and comfortable SKIN: skin color, texture, turgor are normal, no rashes or significant lesions EYES: normal, Conjunctiva are pink and non-injected, sclera clear OROPHARYNX:no exudate, no erythema and lips, buccal mucosa, and tongue normal  NECK: supple, thyroid normal size, non-tender, without nodularity LYMPH:  no palpable lymphadenopathy in the cervical, axillary or inguinal LUNGS: clear to auscultation and percussion with normal breathing effort HEART: regular rate & rhythm and no murmurs and no lower extremity edema ABDOMEN:abdomen soft, non-tender and normal bowel sounds MUSCULOSKELETAL:no cyanosis of digits and no clubbing  NEURO: alert & oriented x 3 with fluent speech, no focal motor/sensory deficits EXTREMITIES: No lower extremity edema  LABORATORY DATA:  I have reviewed the data as listed   Chemistry      Component Value Date/Time   NA 140 03/29/2015 1619   NA 142 03/19/2015 0847   K 4.3 03/29/2015 1619   K 3.7 03/19/2015 0847   CL 103 03/29/2015 1619   CO2 31 03/29/2015 1619   CO2 30* 03/19/2015 0847   BUN 11 03/29/2015 1619   BUN 10.4 03/19/2015 0847   CREATININE 0.68 03/29/2015 1619   CREATININE 0.8 03/19/2015 0847      Component Value Date/Time   CALCIUM 9.1 03/29/2015 1619   CALCIUM 9.4 03/19/2015 0847   ALKPHOS 78 03/19/2015 0847   ALKPHOS 63 03/02/2007 1255   AST 18 03/19/2015 0847   AST 18 03/02/2007 1255   ALT 16 03/19/2015 0847   ALT 18 03/02/2007 1255   BILITOT 0.64  03/19/2015 0847   BILITOT 0.8 03/02/2007 1255       Lab Results  Component Value Date   WBC 11.7* 03/29/2015   HGB 13.5 03/29/2015   HCT 40.1 03/29/2015   MCV 93.5 03/29/2015   PLT 232 03/29/2015   NEUTROABS 9.3* 03/29/2015     ASSESSMENT & PLAN:  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 female breast Rt Lumpectomy 03/26/15: 1.8 cm IDC grade 1, 0/6 LN negative, Er 100%, PR 70%, Her 2 Neg, Ki 67: 5% T1cN0 Stage 1A Oncotype DX score 16, 10% ROR Radiation therapy started 05/25/2015 , treatment break because of wound infection  Recommendation: Await the completion of radiation therapy to start antiestrogen therapy with anastrozole 1 mg daily 5 years  High bleeding risk: PT/PTT INR, Von Willebrand factor antigen, factor VIII, platelet aggregation studies were all normal. We might have to consider sending for platelet storage pool defect evaluation by electron microscopy. But we elected to not do any such procedures at this time.  Return to clinic in 3 months for follow-up to start antiestrogen therapy at that time.  No orders of the defined types were placed in this encounter.   The  patient has a good understanding of the overall plan. she agrees with it. she will call with any problems that may develop before the next visit here.   Rulon Eisenmenger, MD 07/22/2015

## 2015-07-22 NOTE — Assessment & Plan Note (Signed)
Rt Lumpectomy 03/26/15: 1.8 cm IDC grade 1, 0/6 LN negative, Er 100%, PR 70%, Her 2 Neg, Ki 67: 5% T1cN0 Stage 1A Oncotype DX score 16, 10% ROR Radiation therapy started 05/25/2015 , treatment break because of wound infection  Recommendation: Await the completion of radiation therapy to start antiestrogen therapy with anastrozole 1 mg daily 5 years  High bleeding risk: PT/PTT INR, Von Willebrand factor antigen, factor VIII, platelet aggregation studies were all normal. We might have to consider sending for platelet storage pool defect evaluation by electron microscopy.

## 2015-07-22 NOTE — Telephone Encounter (Signed)
Appointments made and avs printed for patient °

## 2015-07-29 ENCOUNTER — Ambulatory Visit
Admission: RE | Admit: 2015-07-29 | Payer: BC Managed Care – PPO | Source: Ambulatory Visit | Admitting: Radiation Oncology

## 2015-07-29 ENCOUNTER — Ambulatory Visit: Payer: BC Managed Care – PPO

## 2015-07-30 ENCOUNTER — Ambulatory Visit
Admission: RE | Admit: 2015-07-30 | Discharge: 2015-07-30 | Disposition: A | Discharge status: Home / Self Care | Payer: BC Managed Care – PPO | Source: Ambulatory Visit | Attending: Radiation Oncology | Admitting: Radiation Oncology

## 2015-07-30 ENCOUNTER — Telehealth: Payer: Self-pay | Admitting: *Deleted

## 2015-07-30 ENCOUNTER — Ambulatory Visit: Payer: BC Managed Care – PPO

## 2015-07-30 NOTE — Telephone Encounter (Signed)
Called Mrs. Setzer to get an update on how her right breast wound is doing.  "Stated I am still packing the wound to my right breast.  It is getting better.  I see Dr. Barry Dienes in two weeks for another assessment; she does not want me to have radiation at this time."  I let her know I would give her update  discussion to the radiation therapist and to Dr. Pablo Ledger today and check back with her on 08-12-15.

## 2015-07-31 ENCOUNTER — Ambulatory Visit: Payer: BC Managed Care – PPO

## 2015-08-01 ENCOUNTER — Ambulatory Visit
Admission: RE | Admit: 2015-08-01 | Discharge: 2015-08-01 | Disposition: A | Discharge status: Home / Self Care | Payer: BC Managed Care – PPO | Source: Ambulatory Visit | Attending: Radiation Oncology | Admitting: Radiation Oncology

## 2015-08-01 ENCOUNTER — Ambulatory Visit: Payer: BC Managed Care – PPO

## 2015-08-04 ENCOUNTER — Ambulatory Visit: Payer: BC Managed Care – PPO

## 2015-08-04 ENCOUNTER — Ambulatory Visit: Admission: RE | Admit: 2015-08-04 | Payer: BC Managed Care – PPO | Source: Ambulatory Visit

## 2015-08-05 ENCOUNTER — Ambulatory Visit: Payer: BC Managed Care – PPO

## 2015-08-06 ENCOUNTER — Other Ambulatory Visit: Payer: Self-pay | Admitting: Adult Health

## 2015-08-06 DIAGNOSIS — C50311 Malignant neoplasm of lower-inner quadrant of right female breast: Secondary | ICD-10-CM

## 2015-08-07 ENCOUNTER — Telehealth: Payer: Self-pay | Admitting: Hematology and Oncology

## 2015-08-07 NOTE — Telephone Encounter (Signed)
Left message for patient in regards to survivorship appointment date/time per 2/1 pof

## 2015-08-08 ENCOUNTER — Ambulatory Visit: Payer: BC Managed Care – PPO

## 2015-08-11 ENCOUNTER — Ambulatory Visit: Payer: BC Managed Care – PPO

## 2015-08-11 NOTE — Progress Notes (Signed)
  Radiation Oncology         ([phone]) [phone] ________________________________  Name: Candace Campbell MRN: [id number]  Date: 06/09/2015  DOB: 1955/11/15  End of Treatment Note  Diagnosis:  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 female breast [facility])   Staging form: Breast, AJCC 7th Edition     Clinical stage from 03/19/2015: Stage IA (T1b, N0, M0) - Unsigned       Staging comments: Staged at breast conference on 9.14.16     Indication for treatment:  Curative    Radiation treatment dates:  05/21/2015-06/09/2015  Site/dose:   Right breast/ 32.04 Gy at 2.67 Gy per fraction x 12 fractions.   Beams/energy:  Opposed tangents with reduced fields / 6 MV photons  Narrative: Candace Campbell unfortunately had struggled with drainage from her lumpectomy cavity prior to starting RT. This resolved during treatment but she developed a seroma infection that necessitated drainage and eventually opening the wound with packing and antibiotics. She was ultimately unable to complete her treatment due to this wound issue.   Plan: The patient has completed radiation treatment. She will continue follow up with Dr. Lindi Adie and will have a follow up with survivorship as well. I communicated with her by phone and will see her prn.  ------------------------------------------------  Thea Silversmith, MD

## 2015-08-12 ENCOUNTER — Ambulatory Visit: Payer: BC Managed Care – PPO

## 2015-08-13 ENCOUNTER — Telehealth: Payer: Self-pay | Admitting: *Deleted

## 2015-08-13 ENCOUNTER — Ambulatory Visit: Payer: BC Managed Care – PPO

## 2015-08-13 NOTE — Addendum Note (Signed)
Encounter addended by: Malena Edman, RN on: 08/13/2015  9:06 AM<BR>     Documentation filed: Demographics Visit

## 2015-08-13 NOTE — Telephone Encounter (Deleted)
Expand All Collapse All   Follow up call after her appointment with Dr. Barry Dienes yesterday, 08/12/15. "Stated I am have to continue to pack the wound for three more weeks, I don't have any infection now. " I spoke with Dr. Pablo Ledger last week and I am not going to receive any more radiation.

## 2015-08-13 NOTE — Telephone Encounter (Signed)
Follow up call after her appointment with Dr. Barry Dienes yesterday, 08/12/15.  "Stated I am have to continue to pack the wound for three more weeks, I don't have any infection now. "  I spoke with Dr. Pablo Ledger last week and I am not going to receive any more radiation.

## 2015-08-14 ENCOUNTER — Telehealth: Payer: Self-pay | Admitting: *Deleted

## 2015-08-14 ENCOUNTER — Ambulatory Visit: Payer: BC Managed Care – PPO

## 2015-08-14 ENCOUNTER — Other Ambulatory Visit: Payer: Self-pay | Admitting: *Deleted

## 2015-08-14 NOTE — Telephone Encounter (Signed)
Spoke with patient to follow up after finishing XRT.  She was unable to complete XRT due to a wound. I informed her we would move up her appointment with Dr. Lindi Adie to the end of February and move her survivorship appointment 2 months out from that.  Encouraged her to call with any needs or concerns.

## 2015-08-15 ENCOUNTER — Telehealth: Payer: Self-pay | Admitting: Hematology and Oncology

## 2015-08-15 ENCOUNTER — Other Ambulatory Visit: Payer: Self-pay | Admitting: *Deleted

## 2015-08-15 NOTE — Telephone Encounter (Signed)
Left message for patient to call the office to confirm appointment date/time changes per 2/10 pof

## 2015-08-15 NOTE — Telephone Encounter (Signed)
Patient called re a message she receieved about changes in her appointments. Confirmed with patient new appointments for f/u w/VG 2/21 and SCP visit 4/20.

## 2015-08-25 NOTE — Assessment & Plan Note (Signed)
Rt Lumpectomy 03/26/15: 1.8 cm IDC grade 1, 0/6 LN negative, Er 100%, PR 70%, Her 2 Neg, Ki 67: 5% T1cN0 Stage 1A Oncotype DX score 16, 10% ROR Radiation therapy started 05/25/2015 , treatment break because of wound infection  Recommendation: Await the completion of radiation therapy to start antiestrogen therapy with anastrozole 1 mg daily 5 years  High bleeding risk: PT/PTT INR, Von Willebrand factor antigen, factor VIII, platelet aggregation studies were all normal. We might have to consider sending for platelet storage pool defect evaluation by electron microscopy. But we elected to not do any such procedures at this time.  Return to clinic in 6 months for follow-up.

## 2015-08-26 ENCOUNTER — Ambulatory Visit (HOSPITAL_BASED_OUTPATIENT_CLINIC_OR_DEPARTMENT_OTHER): Payer: BC Managed Care – PPO | Admitting: Hematology and Oncology

## 2015-08-26 ENCOUNTER — Telehealth: Payer: Self-pay | Admitting: Hematology and Oncology

## 2015-08-26 ENCOUNTER — Encounter: Payer: Self-pay | Admitting: Hematology and Oncology

## 2015-08-26 VITALS — BP 109/95 | HR 75 | Temp 98.3°F | Resp 18 | Wt 136.1 lb

## 2015-08-26 DIAGNOSIS — C50311 Malignant neoplasm of lower-inner quadrant of right female breast: Secondary | ICD-10-CM

## 2015-08-26 DIAGNOSIS — M8580 Other specified disorders of bone density and structure, unspecified site: Secondary | ICD-10-CM

## 2015-08-26 MED ORDER — ANASTROZOLE 1 MG PO TABS: 1.0000 mg | ORAL_TABLET | Freq: Every day | ORAL | Status: DC

## 2015-08-26 MED ORDER — ALENDRONATE SODIUM 70 MG PO TABS: 70.0000 mg | ORAL_TABLET | ORAL | Status: DC

## 2015-08-26 NOTE — Progress Notes (Signed)
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 [facility])   Staging form: Breast, AJCC 7th Edition     Clinical stage from 03/19/2015: Stage IA (T1b, N0, M0) - Unsigned       Staging comments: Staged at breast conference on 9.14.16  SUMMARY OF ONCOLOGIC HISTORY:   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 female breast ([place])   03/04/2015 Mammogram Right breast possible distortion, ultrasound 03/11/2015 suspicious mass right breast 6:00 irregular hypoechoic mass 0.7 x 0.6 x 0.6 cm, adjacent to this innumerable cysts and clusters of cysts measuring 0.7 cm   03/13/2015 Initial Diagnosis Right breast biopsy: Invasive ductal carcinoma grade 1, ER/PR positive HER-2 negative Ki-67 5%, T1 BN0 clinical stage 1A   03/26/2015 Surgery Rt Lumpectomy: 1.8 cm IDC grade 1, 0/6 LN negative, Er 100%, PR 70%, Her 2 Neg, Ki 67: 5% T1cN0 Stage 1A, Oncotype DX score 16, 10% ROR   05/26/2015 - 06/09/2015 Radiation Therapy Adjuvant radiation therapy, treatment stopped because of wound infection 32.04 Gy at 2.67 Gy per fraction x 12 fractions   08/26/2015 -  Anti-estrogen oral therapy anastrozole 1 mg daily    CHIEF COMPLIANT: follow-up to discuss adjuvant therapy  INTERVAL HISTORY: Candace Campbell is a 60 year old with above-mentioned history of right lumpectomy followed by hematoma formation that got infected and she continues to have open wound which is  slowly closing over time. She is having to pack the wound every day. Radiation was discontinued because of the long gap between treatments. She is still healing from the wound infection. She is here today to discuss starting antiestrogen therapy with anastrozole.  REVIEW OF SYSTEMS:   Constitutional: Denies fevers, chills or abnormal weight loss Eyes: Denies blurriness of vision Ears, nose, mouth, throat, and face: Denies mucositis or sore throat Respiratory: Denies cough, dyspnea or wheezes Cardiovascular: Denies palpitation, chest discomfort Gastrointestinal:  Denies nausea, heartburn or change in bowel habits Skin: Denies abnormal skin rashes Lymphatics: Denies new lymphadenopathy or easy bruising Neurological:Denies numbness, tingling or new weaknesses Behavioral/Psych: Mood is stable, no new changes  Extremities: No lower extremity edema Breast: breast wound being packed All other systems were reviewed with the patient and are negative.  I have reviewed the past medical history, past surgical history, social history and family history with the patient and they are unchanged from previous note.  ALLERGIES:  is allergic to latex; clindamycin/lincomycin; peanut-containing drug products; sulfa antibiotics; and iodine.  MEDICATIONS:  Current Outpatient Prescriptions  Medication Sig Dispense Refill  . ALPRAZolam (XANAX) 0.25 MG tablet Take 0.25-0.5 mg by mouth 2 (two) times daily as needed for anxiety (take 2 tablets at bedtime as needed for sleep and 1 tablet in the day as needed).     . ciprofloxacin (CIPRO) 750 MG tablet     . DYMISTA 137-50 MCG/ACT SUSP     . EVEKEO 10 MG TABS Take 10 mg by mouth every morning.     . lamoTRIgine (LAMICTAL) 200 MG tablet Take 200 mg by mouth at bedtime.     Marland Kitchen  Multiple Vitamin (MULTIVITAMIN WITH MINERALS) TABS tablet Take 1 tablet by mouth daily.    Marland Kitchen omeprazole (PRILOSEC OTC) 20 MG tablet Take 20 mg by mouth daily.    Marland Kitchen omeprazole-sodium bicarbonate  (ZEGERID) 40-1100 MG per capsule Take 1 capsule by mouth at bedtime.  3  . Phosphatidylserine-DHA-EPA (VAYARIN PO) Take 2 tablets by mouth every morning.     . predniSONE (DELTASONE) 20 MG tablet 2 daily with food 10 tablet 1  . QNASL 80 MCG/ACT AERS Place 2 sprays into both nostrils daily.  6  . Vortioxetine HBr (TRINTELLIX) 20 MG TABS Take 20 mg by mouth daily.    Marland Kitchen alendronate (FOSAMAX) 70 MG tablet Take 1 tablet (70 mg total) by mouth once a week. Take with a full glass of water on an empty stomach. 12 tablet 3  . anastrozole (ARIMIDEX) 1 MG tablet Take 1 tablet (1 mg total) by mouth daily. 90 tablet 3   No current facility-administered medications for this visit.    PHYSICAL EXAMINATION: ECOG PERFORMANCE STATUS: 1 - Symptomatic but completely ambulatory  Filed Vitals:   08/26/15 1141  BP: 109/95  Pulse: 75  Temp: 98.3 F (36.8 C)  Resp: 18   Filed Weights   08/26/15 1141  Weight: 136 lb 1.6 oz (61.735 kg)    GENERAL:alert, no distress and comfortable SKIN: skin color, texture, turgor are normal, no rashes or significant lesions EYES: normal, Conjunctiva are pink and non-injected, sclera clear OROPHARYNX:no exudate, no erythema and lips, buccal mucosa, and tongue normal  NECK: supple, thyroid normal size, non-tender, without nodularity LYMPH:  no palpable lymphadenopathy in the cervical, axillary or inguinal LUNGS: clear to auscultation and percussion with normal breathing effort HEART: regular rate & rhythm and no murmurs and no lower extremity edema ABDOMEN:abdomen soft, non-tender and normal bowel sounds MUSCULOSKELETAL:no cyanosis of digits and no clubbing  NEURO: alert & oriented x 3 with fluent speech, no focal motor/sensory deficits EXTREMITIES: No lower extremity edema  LABORATORY DATA:  I have reviewed the data as listed   Chemistry      Component Value Date/Time   NA 140 03/29/2015 1619   NA 142 03/19/2015 0847   K 4.3 03/29/2015 1619   K 3.7 03/19/2015  0847   CL 103 03/29/2015 1619   CO2 31 03/29/2015 1619   CO2 30* 03/19/2015 0847   BUN 11 03/29/2015 1619   BUN 10.4 03/19/2015 0847   CREATININE 0.68 03/29/2015 1619   CREATININE 0.8 03/19/2015 0847      Component Value Date/Time   CALCIUM 9.1 03/29/2015 1619   CALCIUM 9.4 03/19/2015 0847   ALKPHOS 78 03/19/2015 0847   ALKPHOS 63 03/02/2007 1255   AST 18 03/19/2015 0847   AST 18 03/02/2007 1255   ALT 16 03/19/2015 0847   ALT 18 03/02/2007 1255   BILITOT 0.64 03/19/2015 0847   BILITOT 0.8 03/02/2007 1255       Lab Results  Component Value Date   WBC 11.7* 03/29/2015   HGB 13.5 03/29/2015   HCT 40.1 03/29/2015   MCV 93.5 03/29/2015   PLT 232 03/29/2015   NEUTROABS 9.3* 03/29/2015   ASSESSMENT & PLAN:  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 female breast Rt Lumpectomy 03/26/15: 1.8 cm IDC grade 1, 0/6 LN negative, Er 100%, PR 70%, Her 2 Neg, Ki 67: 5% T1cN0 Stage 1A Oncotype DX score 16, 10% ROR Radiation therapy started 05/25/2015 discontinued after 12 fractions because of nonhealing wound infection  Recommendation: start antiestrogen therapy  with anastrozole 1 mg daily 5 years  Anastrozole counseling:We discussed the risks and benefits of anti-estrogen therapy with aromatase inhibitors. These include but not limited to insomnia, hot flashes, mood changes, vaginal dryness, bone density loss, and weight gain. We strongly believe that the benefits far outweigh the risks. Patient understands these risks and consented to starting treatment. Planned treatment duration is 5 years.  High bleeding risk: PT/PTT INR, Von Willebrand factor antigen, factor VIII, platelet aggregation studies were all normal. We might have to consider sending for platelet storage pool defect evaluation by electron microscopy. But we elected to not do any such procedures at this time.  Wound infection: Currently being packed.  Severe osteopenia: T score -2.4: I recommended that she start  bisphosphonate therapy. I started her on alendronate once a week. I gave her instructions on how to take alendronate. She cannot lie down after taking the medication for fear of esophagitis. We also discussed risk of osteonecrosis of the jaw.  Return to clinic in 3 months for follow-up.  No orders of the defined types were placed in this encounter.   The patient has a good understanding of the overall plan. she agrees with it. she will call with any problems that may develop before the next visit here.   Rulon Eisenmenger, MD 08/26/2015

## 2015-08-26 NOTE — Telephone Encounter (Signed)
appt made and avs printed °

## 2015-09-04 ENCOUNTER — Encounter: Payer: BC Managed Care – PPO | Admitting: Nurse Practitioner

## 2015-09-09 ENCOUNTER — Ambulatory Visit (HOSPITAL_BASED_OUTPATIENT_CLINIC_OR_DEPARTMENT_OTHER): Payer: BC Managed Care – PPO | Admitting: Nurse Practitioner

## 2015-09-09 ENCOUNTER — Encounter: Payer: Self-pay | Admitting: Nurse Practitioner

## 2015-09-09 VITALS — BP 121/67 | HR 76 | Temp 98.5°F | Resp 18 | Ht 64.5 in | Wt 138.0 lb

## 2015-09-09 DIAGNOSIS — C50311 Malignant neoplasm of lower-inner quadrant of right female breast: Secondary | ICD-10-CM

## 2015-09-09 NOTE — Progress Notes (Signed)
CLINIC:  Cancer Survivorship   REASON FOR VISIT:  Routine follow-up post-treatment for a recent history of breast cancer.  BRIEF ONCOLOGIC HISTORY:    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 female breast ([place])   03/04/2015 Mammogram Right breast possible distortion, ultrasound 03/11/2015 suspicious mass right breast 6:00 irregular hypoechoic mass 0.7 x 0.6 x 0.6 cm, adjacent to this innumerable cysts and clusters of cysts measuring 0.7 cm   03/13/2015 Initial Biopsy Right breast biopsy: Invasive ductal carcinoma grade 1, ER+ (100%), PR+ (70%),  HER-2 negative (ratio 1.44),  Ki-67 5%   03/13/2015 Clinical Stage Stage IA: T1b N0   03/26/2015 Surgery Rt Lumpectomy: 1.8 cm IDC grade 1, 0/6 LN negative, ER+ 100%, PR+ 70%, Her 2 Neg (ratio 1.35), Ki 67: 5%   03/26/2015 Pathologic Stage Stage IA: pT1c pN0   03/26/2015 Oncotype testing RS 16 (10% ROR)   05/21/2015 - 06/09/2015 Radiation Therapy Adjuvant RT to right breast: 32.04 Gy over 12 fractions; treatment stopped because of wound infection   08/26/2015 -  Anti-estrogen oral therapy Anastrozole 1 mg daily    INTERVAL HISTORY:  Candace Campbell presents to the [facility] today for our initial meeting to review her survivorship care plan detailing her treatment course for breast cancer, as well as monitoring long-term side effects of that treatment, education regarding health maintenance, screening, and overall wellness and health promotion.     Overall, Candace Campbell reports feeling okay since completing her radiation therapy approximately three months ago.  Her radiation was discontinued early due to poor wound healing / continued infection within her right breast.  She states that she continues to see Dr. Barry Dienes regularly for management of her infection and that it is improving overall.  She is tolerating her anastrozole with hot flashes that she states are manageable.  She has noted some vaginal dryness, as well.  She remains fatigued and lacks the  endurance to perform sustained physical activity.  Over the last two weeks, she has tried to resume use of her elliptical (40 minutes / day) and yoga, which has made her quite tired. She is having some difficulty getting good sleep at night.  She denies any ongoing pain or shortness of breath. She has had no headache, cough, or bone pain.  She has a good appetite and denies any weight loss.  She has, in fact, gained weight since her cancer treatment and is frustrated by that.   REVIEW OF SYSTEMS:  General: Fatigue and hot flashes as above. Denies fever, chills, or unintentional weight loss. HEENT: Denies visual changes, hearing loss, mouth sores or difficulty swallowing. Cardiac: Denies palpitations, chest pain, and lower extremity edema.  Respiratory: Denies wheeze or dyspnea on exertion.  Breast: Resolving infection, as above. Otherwise, denies any new nodularity, masses, tenderness, nipple changes, or nipple discharge.  GI: Denies abdominal pain, constipation, diarrhea, nausea, or vomiting.  GU: Mild vaginal dryness, as above. Denies dysuria, hematuria, vaginal bleeding, vaginal discharge. Musculoskeletal: Denies joint or bone pain.  Neuro: Denies recent fall or numbness / tingling in her extremities. Skin: Denies rash, pruritis, or open wounds.  Psych: Insomnia. Mild anxiety. Denies depression or memory loss.   A 14-point review of systems was completed and was negative, except as noted above.   ONCOLOGY TREATMENT TEAM:  1. Surgeon:  Dr. Barry Dienes at [facility]  2. Medical Oncologist: Dr. Lindi Adie 3. Radiation Oncologist: Dr. Pablo Ledger    PAST MEDICAL/SURGICAL HISTORY:  Past Medical History  Diagnosis Date  .  Depression   . GERD (gastroesophageal reflux disease)   . Anxiety   . Blood dyscrasia     states daughter has Von Willebrand and she was tested 03-19-15 by Dr Lindi Adie  . Breast cancer ([place]) 03/13/15    right breast    Past Surgical History  Procedure Laterality Date    . Abdominal hysterectomy    . Breast lumpectomy Left   . Breast lumpectomy with radioactive seed and sentinel lymph node biopsy Right 03/26/2015    Procedure: BREAST LUMPECTOMY WITH RADIOACTIVE SEED AND SENTINEL LYMPH NODE BIOPSY;  Surgeon: [name], MD;  Location: [facility];  Service: General;  Laterality: Right;     ALLERGIES:  Allergies  Allergen Reactions  . Latex Swelling  . Clindamycin/Lincomycin Hives  . Peanut-Containing Drug Products Other (See Comments)    Headache/migraine  . Sulfa Antibiotics Other (See Comments)    Pt does not remember the symptoms "They told me I should not take it anymore".    . Iodine Swelling and Rash    Including Betadine      CURRENT MEDICATIONS:  Current Outpatient Prescriptions on File Prior to Visit  Medication Sig Dispense Refill  . alendronate (FOSAMAX) 70 MG tablet Take 1 tablet (70 mg total) by mouth once a week. Take with a full glass of water on an empty stomach. 12 tablet 3  . ALPRAZolam (XANAX) 0.25 MG tablet Take 0.25-0.5 mg by mouth 2 (two) times daily as needed for anxiety (take 2 tablets at bedtime as needed for sleep and 1 tablet in the day as needed).     Marland Kitchen anastrozole (ARIMIDEX) 1 MG tablet Take 1 tablet (1 mg total) by mouth daily. 90 tablet 3  . DYMISTA 137-50 MCG/ACT SUSP Reported on 09/09/2015    . lamoTRIgine (LAMICTAL) 200 MG tablet Take 200 mg by mouth at bedtime.     . Multiple Vitamin (MULTIVITAMIN WITH MINERALS) TABS tablet Take 1 tablet by mouth daily.    Marland Kitchen omeprazole (PRILOSEC OTC) 20 MG tablet Take 20 mg by mouth daily.    Marland Kitchen omeprazole-sodium bicarbonate (ZEGERID) 40-1100 MG per capsule Take 1 capsule by mouth at bedtime.  3  . Phosphatidylserine-DHA-EPA (VAYARIN PO) Take 2 tablets by mouth every morning.     . Vortioxetine HBr (TRINTELLIX) 20 MG TABS Take 20 mg by mouth daily.    Marland Kitchen EVEKEO 10 MG TABS Take 10 mg by mouth every morning. Reported on 09/09/2015     No current facility-administered  medications on file prior to visit.     ONCOLOGIC FAMILY HISTORY:  Family History  Problem Relation Age of Onset  . Breast cancer Maternal Grandmother      GENETIC COUNSELING/TESTING: No    SOCIAL HISTORY:  Candace Campbell is married and lives with her family in [place], [place].  She has 2 children and cares for her grandson in the afternoons. 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  She denies any current or history of tobacco or illicit drug use.  She uses alcohol on a social basis, less than once / week.   PHYSICAL EXAMINATION:  Vital Signs: Filed Vitals:   09/09/15 0906  BP: 121/67  Pulse: 76  Temp: 98.5 F (36.9 C)  Resp: 18   ECOG Performance Status: 0  General: Well-nourished, well-appearing female in no acute distress.  She is unaccompanied in clinic today.   HEENT: Head is atraumatic and normocephalic.  Pupils equal and reactive to light and accomodation.  Conjunctivae clear without exudate.  Sclerae anicteric. Oral mucosa is pink, moist, and intact without lesions.  Oropharynx is pink without lesions or erythema.  Lymph: No cervical, supraclavicular, infraclavicular, or axillary lymphadenopathy noted on palpation.  Cardiovascular: Regular rate and rhythm without murmurs, rubs, or gallops. Respiratory: Clear to auscultation bilaterally. Chest expansion symmetric without accessory muscle use on inspiration or expiration.  GI: Abdomen soft and round. No tenderness to palpation. Bowel sounds normoactive in 4 quadrants. GU: Deferred.   Neuro: No focal deficits. Steady gait.  Psych: Mood and affect normal and appropriate for situation.  Extremities: No edema, cyanosis, or clubbing.  Skin: Warm and dry. No open lesions noted.   LABORATORY DATA:  None for this visit.  DIAGNOSTIC IMAGING:  None for this visit.     ASSESSMENT AND PLAN:   1. Breast cancer: Stage IA invasive ductal carcinoma of the right breast (03/2015), ER positive, PR positive,  HER2/neu negative, S/P lumpectomy (09/5454) complicated by wound infection with adjuvant radiation therapy discontinued early (06/2015) due to poor wound healing, now on adjuvant endocrine therapy with anastrozole begun 08/26/2015.  Candace Campbell is doing well without clinical symptoms worrisome for disease recurrence. She will follow-up with Dr. Lindi Adie, in May 2017 with history and physical examination per surveillance protocol.  She will continue her anastrozole at this time and report any worsening side effects.  We discussed ways to manage her hot flashes and vaginal dryness at today's visit. A comprehensive survivorship care plan and treatment summary was reviewed with the patient today detailing her breast cancer diagnosis, treatment course, potential late/long-term effects of treatment, appropriate follow-up care with recommendations for the future, and patient education resources.  A copy of this summary, along with a letter will be sent to the patient's primary care provider via in basket message after today's visit.  Candace Campbell is welcome to return to the Survivorship Clinic in the future, as needed; no follow-up will be scheduled at this time.    2. Bone health:  Candace Campbell was osteopenic at the time of her last bone scan.  With her initiation of aromatase inhibitor therapy last month, she and Dr. Lindi Adie discussed the initiation of alendronate, as well.  She began alendronate therapy following that visit and is tolerating it well, thus far. We will continue to monitor her bone density closely while she is on endocrine therapy.  In the meantime, she was encouraged to continue her consumption of foods rich in calcium and vitamin D as well as to increase her weight-bearing activities.  She was given education on specific activities to promote bone health.  3. Cancer screening:  Due to Candace Campbell's history and her age, she should receive screening for skin cancers and colon cancer. She is S/P hysterectomy. The  information and recommendations are listed on the patient's comprehensive care plan/treatment summary and were reviewed in detail with the patient.    4. Health maintenance and wellness promotion: Candace Campbell was encouraged to consume 5-7 servings of fruits and vegetables per day. We reviewed the "Nutrition Rainbow" handout, as well as discussed recommendations to maximize nutrition and minimize recurrence, such as increased intake of fruits, vegetables, lean proteins, and minimizing the intake of red meats and processed foods.  She was also encouraged to work slowly to increase her physical activity to engage in moderate to vigorous exercise for 30 minutes per day most days of the week.  Prior to her cancer diagnosis, Ms. Starrett was very active performing regular cardio and strengthening  exercise.  She looks forward to being able to return to that level of activity. She was instructed to limit her alcohol consumption and continue to abstain from tobacco use. A copy of the "Take Control of Your Health" brochure was given to her reinforcing these recommendations.   5. Support services/counseling: It is not uncommon for this period of the patient's cancer care trajectory to be one of many emotions and stressors.  We discussed an opportunity for her to participate in the next session of [facility] ("Finding Your New Normal") support group series designed for patients after they have completed treatment.  Candace Campbell was encouraged to take advantage of our many other support services programs, support groups, and/or counseling in coping with her new life as a cancer survivor after completing anti-cancer treatment.  She was offered support today through active listening and expressive supportive counseling. She was given information regarding our available services and encouraged to contact me with any questions or for help enrolling in any of our support group/programs.    A total of 50 minutes of face-to-face time was spent  with this patient with greater than 50% of that time in counseling and care-coordination.   Sylvan Cheese, NP  Survivorship Program [facility] [phone]   Note: PRIMARY CARE PROVIDER Marjorie Smolder, [place] [phone]

## 2015-10-06 ENCOUNTER — Ambulatory Visit
Admission: RE | Admit: 2015-10-06 | Discharge: 2015-10-06 | Disposition: A | Discharge status: Home / Self Care | Payer: BC Managed Care – PPO | Source: Ambulatory Visit | Attending: General Surgery | Admitting: General Surgery

## 2015-10-06 ENCOUNTER — Other Ambulatory Visit: Payer: Self-pay | Admitting: General Surgery

## 2015-10-06 DIAGNOSIS — N6310 Unspecified lump in the right breast, unspecified quadrant: Secondary | ICD-10-CM

## 2015-10-21 ENCOUNTER — Ambulatory Visit: Payer: BC Managed Care – PPO | Admitting: Hematology and Oncology

## 2015-10-23 ENCOUNTER — Encounter: Payer: BC Managed Care – PPO | Admitting: Nurse Practitioner

## 2015-11-18 ENCOUNTER — Telehealth: Payer: Self-pay | Admitting: *Deleted

## 2015-11-18 NOTE — Telephone Encounter (Signed)
  Oncology Nurse Navigator Documentation    Navigator Encounter Type: Telephone (11/18/15 1500) Telephone: Incoming Call;Appt Confirmation/Clarification (11/18/15 1500)  Pt will be out of town on 11/24/15. Request appt change.                            Acuity: Level 1 (11/18/15 1500)

## 2015-11-24 ENCOUNTER — Ambulatory Visit: Payer: BC Managed Care – PPO | Admitting: Hematology and Oncology

## 2015-12-02 ENCOUNTER — Encounter: Payer: Self-pay | Admitting: Hematology and Oncology

## 2015-12-02 ENCOUNTER — Telehealth: Payer: Self-pay | Admitting: Hematology and Oncology

## 2015-12-02 ENCOUNTER — Ambulatory Visit (HOSPITAL_BASED_OUTPATIENT_CLINIC_OR_DEPARTMENT_OTHER): Payer: BC Managed Care – PPO | Admitting: Hematology and Oncology

## 2015-12-02 VITALS — BP 109/67 | HR 82 | Temp 98.2°F | Resp 18 | Wt 135.1 lb

## 2015-12-02 DIAGNOSIS — Z79811 Long term (current) use of aromatase inhibitors: Secondary | ICD-10-CM

## 2015-12-02 DIAGNOSIS — C50311 Malignant neoplasm of lower-inner quadrant of right female breast: Secondary | ICD-10-CM | POA: Diagnosis not present

## 2015-12-02 NOTE — Telephone Encounter (Signed)
appt made and avs printed °

## 2015-12-02 NOTE — Progress Notes (Signed)
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 [facility])   Staging form: Breast, AJCC 7th Edition     Clinical stage from 03/19/2015: Stage IA (T1b, N0, M0) - Unsigned       Staging comments: Staged at breast conference on 9.14.16      Pathologic stage from 03/26/2015: Stage IA (T1c, N0, cM0) - Unsigned   SUMMARY OF ONCOLOGIC HISTORY:   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 female breast ([place])   03/04/2015 Mammogram Right breast possible distortion, ultrasound 03/11/2015 suspicious mass right breast 6:00 irregular hypoechoic mass 0.7 x 0.6 x 0.6 cm, adjacent to this innumerable cysts and clusters of cysts measuring 0.7 cm   03/13/2015 Initial Biopsy Right breast biopsy: Invasive ductal carcinoma grade 1, ER+ (100%), PR+ (70%),  HER-2 negative (ratio 1.44),  Ki-67 5%   03/13/2015 Clinical Stage Stage IA: T1b N0   03/26/2015 Surgery Rt Lumpectomy: 1.8 cm IDC grade 1, 0/6 LN negative, ER+ 100%, PR+ 70%, Her 2 Neg (ratio 1.35), Ki 67: 5%   03/26/2015 Pathologic Stage Stage IA: pT1c pN0   03/26/2015 Oncotype testing RS 16 (10% ROR)   05/21/2015 - 06/09/2015 Radiation Therapy Adjuvant RT to right breast: 32.04 Gy over 12 fractions; treatment stopped because of wound infection   08/26/2015 -  Anti-estrogen oral therapy Anastrozole 1 mg daily   09/09/2015 Survivorship Survivorship visit completed and copy of care plan given to patient     CHIEF COMPLIANT:  Follow-up on anastrozole, wound finally closed  INTERVAL HISTORY: Candace Campbell is a 60 year old with above-mentioned history of right breast cancer treated with lumpectomy followed by a short course of adjuvant radiation which have to be discontinued because of wound infection. She has been on anastrozole since February 2017. She appears to be tolerating it fairly well. She had severe hot flashes initially which later on resolved. She was on multiple antibiotics for the breast wound infection and she completed the most recent course of Bactrim. The wound finally closed and but there appears to be some pinkish coloration around it.  REVIEW OF SYSTEMS:   Constitutional: Denies fevers, chills or abnormal weight loss Eyes: Denies blurriness of vision Ears, nose, mouth, throat, and face: Denies mucositis or sore throat Respiratory: Denies cough, dyspnea or wheezes Cardiovascular: Denies palpitation, chest discomfort Gastrointestinal:  Denies nausea, heartburn or change in bowel habits Skin: Denies abnormal skin rashes Lymphatics: Denies new lymphadenopathy or easy bruising Neurological:Denies numbness, tingling or new weaknesses Behavioral/Psych: Mood is stable, no new changes  Extremities: No lower extremity edema Breast: Breast wound finally closed All other systems were reviewed with the patient and are negative.  I have reviewed the past medical history, past surgical history, social history and family history with the patient and they are unchanged from previous note.  ALLERGIES:  is allergic to latex; clindamycin/lincomycin; peanut-containing drug products; sulfa antibiotics; and iodine.  MEDICATIONS:  Current Outpatient Prescriptions  Medication Sig Dispense Refill  . alendronate (FOSAMAX) 70 MG tablet Take 1 tablet (70 mg total) by mouth once  a week. Take with a full glass of water on an empty stomach. 12 tablet 3  . ALPRAZolam (XANAX) 0.25 MG tablet Take 0.25-0.5  mg by mouth 2 (two) times daily as needed for anxiety (take 2 tablets at bedtime as needed for sleep and 1 tablet in the day as needed).     Marland Kitchen anastrozole (ARIMIDEX) 1 MG tablet Take 1 tablet (1 mg total) by mouth daily. 90 tablet 3  . DYMISTA 137-50 MCG/ACT SUSP Reported on 09/09/2015    . EVEKEO 10 MG TABS Take 10 mg by mouth every morning. Reported on 09/09/2015    . lamoTRIgine (LAMICTAL) 200 MG tablet Take 200 mg by mouth at bedtime.     . Multiple Vitamin (MULTIVITAMIN WITH MINERALS) TABS tablet Take 1 tablet by mouth daily.    Marland Kitchen omeprazole (PRILOSEC OTC) 20 MG tablet Take 20 mg by mouth daily.    Marland Kitchen omeprazole-sodium bicarbonate (ZEGERID) 40-1100 MG per capsule Take 1 capsule by mouth at bedtime.  3  . Phosphatidylserine-DHA-EPA (VAYARIN PO) Take 2 tablets by mouth every morning.     . prazosin (MINIPRESS) 1 MG capsule Take 1 mg by mouth at bedtime.    . Vortioxetine HBr (TRINTELLIX) 20 MG TABS Take 20 mg by mouth daily.     No current facility-administered medications for this visit.    PHYSICAL EXAMINATION: ECOG PERFORMANCE STATUS: 1 - Symptomatic but completely ambulatory  Filed Vitals:   12/02/15 0903  BP: 109/67  Pulse: 82  Temp: 98.2 F (36.8 C)  Resp: 18   Filed Weights   12/02/15 0903  Weight: 135 lb 1.6 oz (61.281 kg)    GENERAL:alert, no distress and comfortable SKIN: skin color, texture, turgor are normal, no rashes or significant lesions EYES: normal, Conjunctiva are pink and non-injected, sclera clear OROPHARYNX:no exudate, no erythema and lips, buccal mucosa, and tongue normal  NECK: supple, thyroid normal size, non-tender, without nodularity LYMPH:  no palpable lymphadenopathy in the cervical, axillary or inguinal LUNGS: clear to auscultation and percussion with normal breathing effort HEART: regular rate & rhythm and no murmurs and no lower extremity edema ABDOMEN:abdomen soft, non-tender and normal bowel sounds MUSCULOSKELETAL:no cyanosis of digits and no  clubbing  NEURO: alert & oriented x 3 with fluent speech, no focal motor/sensory deficits EXTREMITIES: No lower extremity edema BREAST: resolution of infection. Periareolar subcutaneous nodule/thickening from her prior infection. (exam performed in the presence of a chaperone)  LABORATORY DATA:  I have reviewed the data as listed   Chemistry      Component Value Date/Time   NA 140 03/29/2015 1619   NA 142 03/19/2015 0847   K 4.3 03/29/2015 1619   K 3.7 03/19/2015 0847   CL 103 03/29/2015 1619   CO2 31 03/29/2015 1619   CO2 30* 03/19/2015 0847   BUN 11 03/29/2015 1619   BUN 10.4 03/19/2015 0847   CREATININE 0.68 03/29/2015 1619   CREATININE 0.8 03/19/2015 0847      Component Value Date/Time   CALCIUM 9.1 03/29/2015 1619   CALCIUM 9.4 03/19/2015 0847   ALKPHOS 78 03/19/2015 0847   ALKPHOS 63 03/02/2007 1255   AST 18 03/19/2015 0847   AST 18 03/02/2007 1255   ALT 16 03/19/2015 0847   ALT 18 03/02/2007 1255   BILITOT 0.64 03/19/2015 0847   BILITOT 0.8 03/02/2007 1255       Lab Results  Component Value Date   WBC 11.7* 03/29/2015   HGB 13.5 03/29/2015   HCT 40.1 03/29/2015  MCV 93.5 03/29/2015   PLT 232 03/29/2015   NEUTROABS 9.3* 03/29/2015     ASSESSMENT & PLAN:  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 female breast Rt Lumpectomy 03/26/15: 1.8 cm IDC grade 1, 0/6 LN negative, Er 100%, PR 70%, Her 2 Neg, Ki 67: 5% T1cN0 Stage 1A Oncotype DX score 16, 10% ROR Radiation therapy started 05/25/2015 discontinued after 12 fractions because of nonhealing wound infection Current treatment: Adjuvant antiestrogen therapy with anastrozole 1 mg daily 5 years started 08/26/2015  High bleeding risk/poor wound healing: Breast wound finally closed with multiple rounds of antibiotics and wound care Severe osteopenia T score -2.4: Currently on alendronate with calcium and vitamin D  Return to clinic in 6 months for follow  No orders of the defined types were placed in  this encounter.   The patient has a good understanding of the overall plan. she agrees with it. she will call with any problems that may develop before the next visit here.   Rulon Eisenmenger, MD 12/02/2015

## 2015-12-02 NOTE — Assessment & Plan Note (Signed)
Rt Lumpectomy 03/26/15: 1.8 cm IDC grade 1, 0/6 LN negative, Er 100%, PR 70%, Her 2 Neg, Ki 67: 5% T1cN0 Stage 1A Oncotype DX score 16, 10% ROR Radiation therapy started 05/25/2015 discontinued after 12 fractions because of nonhealing wound infection Current treatment: Adjuvant antiestrogen therapy with anastrozole 1 mg daily 5 years started 08/26/2015  High bleeding risk/poor wound healing: Severe osteopenia T score -2.4: Currently on alendronate with calcium and vitamin D  Return to clinic in 6 months for follow

## 2016-02-13 ENCOUNTER — Other Ambulatory Visit: Payer: Self-pay | Admitting: General Surgery

## 2016-02-13 DIAGNOSIS — Z853 Personal history of malignant neoplasm of breast: Secondary | ICD-10-CM

## 2016-02-23 NOTE — Telephone Encounter (Signed)
error 

## 2016-02-27 ENCOUNTER — Other Ambulatory Visit: Payer: Self-pay | Admitting: Neurosurgery

## 2016-03-02 ENCOUNTER — Encounter (HOSPITAL_COMMUNITY)
Admission: RE | Admit: 2016-03-02 | Discharge: 2016-03-02 | Disposition: A | Discharge status: Home / Self Care | Payer: BC Managed Care – PPO | Source: Ambulatory Visit | Attending: Neurosurgery | Admitting: Neurosurgery

## 2016-03-02 ENCOUNTER — Encounter (HOSPITAL_COMMUNITY): Payer: Self-pay

## 2016-03-02 DIAGNOSIS — Z803 Family history of malignant neoplasm of breast: Secondary | ICD-10-CM | POA: Diagnosis not present

## 2016-03-02 DIAGNOSIS — Z853 Personal history of malignant neoplasm of breast: Secondary | ICD-10-CM | POA: Diagnosis not present

## 2016-03-02 DIAGNOSIS — Z7983 Long term (current) use of bisphosphonates: Secondary | ICD-10-CM | POA: Diagnosis not present

## 2016-03-02 DIAGNOSIS — K219 Gastro-esophageal reflux disease without esophagitis: Secondary | ICD-10-CM | POA: Diagnosis not present

## 2016-03-02 DIAGNOSIS — F329 Major depressive disorder, single episode, unspecified: Secondary | ICD-10-CM | POA: Diagnosis not present

## 2016-03-02 DIAGNOSIS — Z79811 Long term (current) use of aromatase inhibitors: Secondary | ICD-10-CM | POA: Diagnosis not present

## 2016-03-02 DIAGNOSIS — Z79899 Other long term (current) drug therapy: Secondary | ICD-10-CM | POA: Diagnosis not present

## 2016-03-02 DIAGNOSIS — Z7951 Long term (current) use of inhaled steroids: Secondary | ICD-10-CM | POA: Diagnosis not present

## 2016-03-02 DIAGNOSIS — M21371 Foot drop, right foot: Secondary | ICD-10-CM | POA: Diagnosis not present

## 2016-03-02 DIAGNOSIS — M4726 Other spondylosis with radiculopathy, lumbar region: Secondary | ICD-10-CM | POA: Diagnosis not present

## 2016-03-02 DIAGNOSIS — M5116 Intervertebral disc disorders with radiculopathy, lumbar region: Secondary | ICD-10-CM | POA: Diagnosis not present

## 2016-03-02 DIAGNOSIS — F419 Anxiety disorder, unspecified: Secondary | ICD-10-CM | POA: Diagnosis not present

## 2016-03-02 HISTORY — DX: Personal history of urinary calculi: Z87.442

## 2016-03-02 HISTORY — DX: Other specified postprocedural states: Z98.890

## 2016-03-02 HISTORY — DX: Nausea with vomiting, unspecified: R11.2

## 2016-03-02 HISTORY — DX: Headache, unspecified: R51.9

## 2016-03-02 HISTORY — DX: Headache: R51

## 2016-03-02 LAB — BASIC METABOLIC PANEL
Anion gap: 7 (ref 5–15)
BUN: 11 mg/dL (ref 6–20)
CO2: 30 mmol/L (ref 22–32)
Calcium: 9.2 mg/dL (ref 8.9–10.3)
Chloride: 103 mmol/L (ref 101–111)
Creatinine, Ser: 0.71 mg/dL (ref 0.44–1.00)
GFR calc Af Amer: >60 mL/min (ref >60)
GFR calc non Af Amer: >60 mL/min (ref >60)
Glucose, Bld: 115 mg/dL — ABNORMAL HIGH (ref 65–99)
Potassium: 4.2 mmol/L (ref 3.5–5.1)
Sodium: 140 mmol/L (ref 135–145)

## 2016-03-02 LAB — CBC
HCT: 42 % (ref 36.0–46.0)
Hemoglobin: 13.5 g/dL (ref 12.0–15.0)
MCH: 30.5 pg (ref 26.0–34.0)
MCHC: 32.1 g/dL (ref 30.0–36.0)
MCV: 94.8 fL (ref 78.0–100.0)
Platelets: 197 10*3/uL (ref 150–400)
RBC: 4.43 MIL/uL (ref 3.87–5.11)
RDW: 13.3 % (ref 11.5–15.5)
WBC: 5.2 10*3/uL (ref 4.0–10.5)

## 2016-03-02 LAB — TYPE AND SCREEN
ABO/RH(D): O POS
Antibody Screen: NEGATIVE

## 2016-03-02 LAB — SURGICAL PCR SCREEN
MRSA, PCR: NEGATIVE
Staphylococcus aureus: NEGATIVE

## 2016-03-02 LAB — ABO/RH: ABO/RH(D): O POS

## 2016-03-02 NOTE — Pre-Procedure Instructions (Signed)
CHANETTE KILL  03/02/2016      [facility] [place], [address] AT [address] [place] [address] Phone: [phone] Fax: [phone]    Your procedure is scheduled on    Wednesday  03/03/16.  Report to [facility] Admitting at 630 A.M.  Call this number if you have problems the morning of surgery:  [phone]   Remember:  Do not eat food or drink liquids after midnight.  Take these medicines the morning of surgery with A SIP OF WATER  Anastrazole, omeprazole (prilosec)    (stop aspirin, or aspirin products, ibuprofen/ advil/ motrin, goody powders, bc's, herbal medicines, multivitamin)   Do not wear jewelry, make-up or nail polish.  Do not wear lotions, powders, or perfumes, or deoderant.  Do not shave 48 hours prior to surgery.  Men may shave face and neck.  Do not bring valuables to the hospital.  [facility] is not responsible for any belongings or valuables.  Contacts, dentures or bridgework may not be worn into surgery.  Leave your suitcase in the car.  After surgery it may be brought to your room.  For patients admitted to the hospital, discharge time will be determined by your treatment team.  Patients discharged the day of surgery will not be allowed to drive home.   Name and phone number of your driver:    Special instructions:  [facility] - Preparing for Surgery  Before surgery, you can play an important role.  Because skin is not sterile, your skin needs to be as free of germs as possible.  You can reduce the number of germs on you skin by washing with CHG (chlorahexidine gluconate) soap before surgery.  CHG is an antiseptic cleaner which kills germs and bonds with the skin to continue killing germs even after washing.  Please DO NOT use if you have an allergy to CHG or antibacterial soaps.  If your skin becomes reddened/irritated stop using the CHG and inform your nurse when you  arrive at Short Stay.  Do not shave (including legs and underarms) for at least 48 hours prior to the first CHG shower.  You may shave your face.  Please follow these instructions carefully:   1.  Shower with CHG Soap the night before surgery and the                                morning of Surgery.  2.  If you choose to wash your hair, wash your hair first as usual with your       normal shampoo.  3.  After you shampoo, rinse your hair and body thoroughly to remove the                      Shampoo.  4.  Use CHG as you would any other liquid soap.  You can apply chg directly       to the skin and wash gently with scrungie or a clean washcloth.  5.  Apply the CHG Soap to your body ONLY FROM THE NECK DOWN.        Do not use on open wounds or open sores.  Avoid contact with your eyes,       ears, mouth and genitals (private parts).  Wash genitals (private parts)  with your normal soap.  6.  Wash thoroughly, paying special attention to the area where your surgery        will be performed.  7.  Thoroughly rinse your body with warm water from the neck down.  8.  DO NOT shower/wash with your normal soap after using and rinsing off       the CHG Soap.  9.  Pat yourself dry with a clean towel.            10.  Wear clean pajamas.            11.  Place clean sheets on your bed the night of your first shower and do not        sleep with pets.  Day of Surgery  Do not apply any lotions/deoderants the morning of surgery.  Please wear clean clothes to the hospital/surgery center.    Please read over the following fact sheets that you were given. Pain Booklet, MRSA Information and Surgical Site Infection Prevention

## 2016-03-02 NOTE — Anesthesia Preprocedure Evaluation (Signed)
Anesthesia Evaluation  Patient identified by MRN, date of birth, ID band Patient awake    Reviewed: Allergy & Precautions, H&P , Patient's Chart, lab work & pertinent test results, reviewed documented beta blocker date and time   Airway Mallampati: II  TM Distance: >3 FB Neck ROM: full    Dental no notable dental hx.    Pulmonary    Pulmonary exam normal breath sounds clear to auscultation       Cardiovascular  Rhythm:regular Rate:Normal     Neuro/Psych    GI/Hepatic   Endo/Other    Renal/GU      Musculoskeletal   Abdominal   Peds  Hematology   Anesthesia Other Findings   Reproductive/Obstetrics                             Anesthesia Physical Anesthesia Plan  ASA: II  Anesthesia Plan: General   Post-op Pain Management:    Induction: Intravenous  Airway Management Planned: Oral ETT  Additional Equipment:   Intra-op Plan:   Post-operative Plan: Extubation in OR  Informed Consent: I have reviewed the patients History and Physical, chart, labs and discussed the procedure including the risks, benefits and alternatives for the proposed anesthesia with the patient or authorized representative who has indicated his/her understanding and acceptance.   Dental Advisory Given and Dental advisory given  Plan Discussed with: CRNA and Surgeon  Anesthesia Plan Comments: (  Discussed general anesthesia, including possible nausea, instrumentation of airway, sore throat,pulmonary aspiration, etc. I asked if the were any outstanding questions, or  concerns before we proceeded.)        Anesthesia Quick Evaluation  

## 2016-03-02 NOTE — Progress Notes (Signed)
SPOKE [place] OFFICE TO HAVE ORDERS SIGNED.  LAB NEEDS TO BE ABLE TO GET PLATELETS AVAILABLE FOR TOMORROW.

## 2016-03-03 ENCOUNTER — Encounter (HOSPITAL_COMMUNITY): Payer: Self-pay | Admitting: Urology

## 2016-03-03 ENCOUNTER — Observation Stay (HOSPITAL_COMMUNITY): Payer: BC Managed Care – PPO

## 2016-03-03 ENCOUNTER — Observation Stay (HOSPITAL_COMMUNITY)
Admission: RE | Admit: 2016-03-03 | Discharge: 2016-03-04 | Disposition: A | Discharge status: Home / Self Care | Payer: BC Managed Care – PPO | Source: Ambulatory Visit | Attending: Neurosurgery | Admitting: Neurosurgery

## 2016-03-03 ENCOUNTER — Ambulatory Visit (HOSPITAL_COMMUNITY): Payer: BC Managed Care – PPO | Admitting: Emergency Medicine

## 2016-03-03 ENCOUNTER — Ambulatory Visit (HOSPITAL_COMMUNITY): Payer: BC Managed Care – PPO | Admitting: Certified Registered Nurse Anesthetist

## 2016-03-03 ENCOUNTER — Encounter (HOSPITAL_COMMUNITY)
Admission: RE | Disposition: A | Discharge status: Home / Self Care | Payer: Self-pay | Source: Ambulatory Visit | Attending: Neurosurgery

## 2016-03-03 DIAGNOSIS — F419 Anxiety disorder, unspecified: Secondary | ICD-10-CM | POA: Insufficient documentation

## 2016-03-03 DIAGNOSIS — K219 Gastro-esophageal reflux disease without esophagitis: Secondary | ICD-10-CM | POA: Insufficient documentation

## 2016-03-03 DIAGNOSIS — M5116 Intervertebral disc disorders with radiculopathy, lumbar region: Secondary | ICD-10-CM | POA: Diagnosis not present

## 2016-03-03 DIAGNOSIS — Z803 Family history of malignant neoplasm of breast: Secondary | ICD-10-CM | POA: Insufficient documentation

## 2016-03-03 DIAGNOSIS — Z853 Personal history of malignant neoplasm of breast: Secondary | ICD-10-CM | POA: Insufficient documentation

## 2016-03-03 DIAGNOSIS — F329 Major depressive disorder, single episode, unspecified: Secondary | ICD-10-CM | POA: Insufficient documentation

## 2016-03-03 DIAGNOSIS — Z7983 Long term (current) use of bisphosphonates: Secondary | ICD-10-CM | POA: Insufficient documentation

## 2016-03-03 DIAGNOSIS — Z7951 Long term (current) use of inhaled steroids: Secondary | ICD-10-CM | POA: Insufficient documentation

## 2016-03-03 DIAGNOSIS — Z419 Encounter for procedure for purposes other than remedying health state, unspecified: Secondary | ICD-10-CM

## 2016-03-03 DIAGNOSIS — M5126 Other intervertebral disc displacement, lumbar region: Secondary | ICD-10-CM | POA: Diagnosis present

## 2016-03-03 DIAGNOSIS — M4726 Other spondylosis with radiculopathy, lumbar region: Secondary | ICD-10-CM | POA: Insufficient documentation

## 2016-03-03 DIAGNOSIS — M21371 Foot drop, right foot: Secondary | ICD-10-CM | POA: Insufficient documentation

## 2016-03-03 DIAGNOSIS — Z79811 Long term (current) use of aromatase inhibitors: Secondary | ICD-10-CM | POA: Insufficient documentation

## 2016-03-03 DIAGNOSIS — Z79899 Other long term (current) drug therapy: Secondary | ICD-10-CM | POA: Insufficient documentation

## 2016-03-03 HISTORY — PX: LUMBAR LAMINECTOMY/DECOMPRESSION MICRODISCECTOMY: SHX5026

## 2016-03-03 SURGERY — LUMBAR LAMINECTOMY/DECOMPRESSION MICRODISCECTOMY 1 LEVEL
Anesthesia: General | Site: Spine Lumbar | Laterality: Right

## 2016-03-03 MED ORDER — MORPHINE SULFATE (PF) 4 MG/ML IV SOLN: 4.0000 mg | INTRAVENOUS | Status: DC | PRN

## 2016-03-03 MED ORDER — PROPOFOL 10 MG/ML IV BOLUS
INTRAVENOUS | Status: AC
  Filled 2016-03-03: qty 40

## 2016-03-03 MED ORDER — BISACODYL 10 MG RE SUPP: 10.0000 mg | Freq: Every day | RECTAL | Status: DC | PRN

## 2016-03-03 MED ORDER — THROMBIN 5000 UNITS EX SOLR
CUTANEOUS | Status: DC | PRN
  Administered 2016-03-03 (×2): 5000 [IU] via TOPICAL

## 2016-03-03 MED ORDER — VORTIOXETINE HBR 20 MG PO TABS
20.0000 mg | ORAL_TABLET | Freq: Every day | ORAL | Status: DC
  Filled 2016-03-03: qty 20

## 2016-03-03 MED ORDER — ONDANSETRON HCL 4 MG PO TABS: 4.0000 mg | ORAL_TABLET | Freq: Four times a day (QID) | ORAL | Status: DC | PRN

## 2016-03-03 MED ORDER — CEFAZOLIN SODIUM-DEXTROSE 2-4 GM/100ML-% IV SOLN
INTRAVENOUS | Status: AC
  Filled 2016-03-03: qty 100

## 2016-03-03 MED ORDER — SODIUM CHLORIDE 0.9 % IV SOLN
Freq: Once | INTRAVENOUS | Status: AC
  Administered 2016-03-03: 11:00:00 via INTRAVENOUS

## 2016-03-03 MED ORDER — ACETAMINOPHEN 325 MG PO TABS: 650.0000 mg | ORAL_TABLET | ORAL | Status: DC | PRN

## 2016-03-03 MED ORDER — HEMOSTATIC AGENTS (NO CHARGE) OPTIME
TOPICAL | Status: DC | PRN
  Administered 2016-03-03: 1 via TOPICAL

## 2016-03-03 MED ORDER — HYDROXYZINE HCL 50 MG/ML IM SOLN: 50.0000 mg | INTRAMUSCULAR | Status: DC | PRN

## 2016-03-03 MED ORDER — FENTANYL CITRATE (PF) 100 MCG/2ML IJ SOLN
INTRAMUSCULAR | Status: DC | PRN
  Administered 2016-03-03: 100 ug via INTRAVENOUS

## 2016-03-03 MED ORDER — KETOROLAC TROMETHAMINE 30 MG/ML IJ SOLN
INTRAMUSCULAR | Status: AC
  Filled 2016-03-03: qty 1

## 2016-03-03 MED ORDER — PHENYLEPHRINE HCL 10 MG/ML IJ SOLN
INTRAMUSCULAR | Status: DC | PRN
  Administered 2016-03-03: 25 ug/min via INTRAVENOUS

## 2016-03-03 MED ORDER — SODIUM CHLORIDE 0.9% FLUSH: 3.0000 mL | INTRAVENOUS | Status: DC | PRN

## 2016-03-03 MED ORDER — KCL IN DEXTROSE-NACL 20-5-0.45 MEQ/L-%-% IV SOLN: INTRAVENOUS | Status: DC

## 2016-03-03 MED ORDER — FENTANYL CITRATE (PF) 100 MCG/2ML IJ SOLN
INTRAMUSCULAR | Status: AC
  Filled 2016-03-03: qty 2

## 2016-03-03 MED ORDER — HYDROXYZINE HCL 25 MG PO TABS: 50.0000 mg | ORAL_TABLET | ORAL | Status: DC | PRN

## 2016-03-03 MED ORDER — POLYETHYLENE GLYCOL 3350 17 G PO PACK
17.0000 g | PACK | Freq: Every day | ORAL | Status: DC
  Administered 2016-03-03: 17 g via ORAL

## 2016-03-03 MED ORDER — EPHEDRINE SULFATE-NACL 50-0.9 MG/10ML-% IV SOSY
PREFILLED_SYRINGE | INTRAVENOUS | Status: DC | PRN
  Administered 2016-03-03 (×2): 5 mg via INTRAVENOUS

## 2016-03-03 MED ORDER — SODIUM CHLORIDE 0.9 % IV SOLN: 250.0000 mL | INTRAVENOUS | Status: DC

## 2016-03-03 MED ORDER — OMEPRAZOLE MAGNESIUM 20 MG PO TBEC: 20.0000 mg | DELAYED_RELEASE_TABLET | Freq: Every day | ORAL | Status: DC

## 2016-03-03 MED ORDER — THROMBIN 5000 UNITS EX SOLR
OROMUCOSAL | Status: DC | PRN
  Administered 2016-03-03: 5 mL via TOPICAL

## 2016-03-03 MED ORDER — LIDOCAINE-EPINEPHRINE 1 %-1:100000 IJ SOLN
INTRAMUSCULAR | Status: DC | PRN
  Administered 2016-03-03: 10 mL

## 2016-03-03 MED ORDER — SUGAMMADEX SODIUM 200 MG/2ML IV SOLN
INTRAVENOUS | Status: DC | PRN
  Administered 2016-03-03: 200 mg via INTRAVENOUS

## 2016-03-03 MED ORDER — ACETAMINOPHEN 10 MG/ML IV SOLN
INTRAVENOUS | Status: AC
  Filled 2016-03-03: qty 100

## 2016-03-03 MED ORDER — FENTANYL CITRATE (PF) 100 MCG/2ML IJ SOLN
INTRAMUSCULAR | Status: DC | PRN
  Administered 2016-03-03 (×3): 50 ug via INTRAVENOUS

## 2016-03-03 MED ORDER — PHENYLEPHRINE 40 MCG/ML (10ML) SYRINGE FOR IV PUSH (FOR BLOOD PRESSURE SUPPORT)
PREFILLED_SYRINGE | INTRAVENOUS | Status: DC | PRN
  Administered 2016-03-03 (×2): 80 ug via INTRAVENOUS

## 2016-03-03 MED ORDER — VORTIOXETINE HBR 20 MG PO TABS: 20.0000 mg | ORAL_TABLET | Freq: Every day | ORAL | Status: DC

## 2016-03-03 MED ORDER — AZELASTINE-FLUTICASONE 137-50 MCG/ACT NA SUSP: 2.0000 | Freq: Every day | NASAL | Status: DC

## 2016-03-03 MED ORDER — FENTANYL CITRATE (PF) 100 MCG/2ML IJ SOLN
INTRAMUSCULAR | Status: AC
  Filled 2016-03-03: qty 4

## 2016-03-03 MED ORDER — LIDOCAINE 2% (20 MG/ML) 5 ML SYRINGE
INTRAMUSCULAR | Status: DC | PRN
  Administered 2016-03-03: 100 mg via INTRAVENOUS

## 2016-03-03 MED ORDER — PROPOFOL 10 MG/ML IV BOLUS
INTRAVENOUS | Status: DC | PRN
  Administered 2016-03-03: 120 mg via INTRAVENOUS

## 2016-03-03 MED ORDER — SODIUM CHLORIDE 0.9 % IR SOLN
Status: DC | PRN
  Administered 2016-03-03: 500 mL

## 2016-03-03 MED ORDER — ONDANSETRON HCL 4 MG/2ML IJ SOLN
INTRAMUSCULAR | Status: DC | PRN
  Administered 2016-03-03: 4 mg via INTRAVENOUS

## 2016-03-03 MED ORDER — KETOROLAC TROMETHAMINE 30 MG/ML IJ SOLN
30.0000 mg | Freq: Once | INTRAMUSCULAR | Status: AC
Start: 2016-03-03 — End: 2016-03-03
  Administered 2016-03-03: 30 mg via INTRAVENOUS

## 2016-03-03 MED ORDER — ALUM & MAG HYDROXIDE-SIMETH 200-200-20 MG/5ML PO SUSP: 30.0000 mL | Freq: Four times a day (QID) | ORAL | Status: DC | PRN

## 2016-03-03 MED ORDER — MIDAZOLAM HCL 2 MG/2ML IJ SOLN
INTRAMUSCULAR | Status: AC
  Filled 2016-03-03: qty 2

## 2016-03-03 MED ORDER — FENTANYL CITRATE (PF) 100 MCG/2ML IJ SOLN
25.0000 ug | INTRAMUSCULAR | Status: DC | PRN
  Administered 2016-03-03 (×3): 50 ug via INTRAVENOUS

## 2016-03-03 MED ORDER — KETOROLAC TROMETHAMINE 30 MG/ML IJ SOLN
30.0000 mg | Freq: Four times a day (QID) | INTRAMUSCULAR | Status: DC
  Administered 2016-03-03 – 2016-03-04 (×4): 30 mg via INTRAVENOUS
  Filled 2016-03-03 (×4): qty 1

## 2016-03-03 MED ORDER — PHENOL 1.4 % MT LIQD: 1.0000 | OROMUCOSAL | Status: DC | PRN

## 2016-03-03 MED ORDER — ANASTROZOLE 1 MG PO TABS
1.0000 mg | ORAL_TABLET | Freq: Every day | ORAL | Status: DC
  Filled 2016-03-03: qty 1

## 2016-03-03 MED ORDER — PANTOPRAZOLE SODIUM 40 MG PO TBEC
40.0000 mg | DELAYED_RELEASE_TABLET | Freq: Every day | ORAL | Status: DC
  Administered 2016-03-04: 40 mg via ORAL
  Filled 2016-03-03: qty 1

## 2016-03-03 MED ORDER — ONDANSETRON HCL 4 MG/2ML IJ SOLN: 4.0000 mg | Freq: Four times a day (QID) | INTRAMUSCULAR | Status: DC | PRN

## 2016-03-03 MED ORDER — BUPIVACAINE HCL (PF) 0.5 % IJ SOLN
INTRAMUSCULAR | Status: DC | PRN
  Administered 2016-03-03: 10 mL

## 2016-03-03 MED ORDER — DEXAMETHASONE SODIUM PHOSPHATE 10 MG/ML IJ SOLN
INTRAMUSCULAR | Status: DC | PRN
  Administered 2016-03-03: 10 mg via INTRAVENOUS

## 2016-03-03 MED ORDER — HYDROCODONE-ACETAMINOPHEN 5-325 MG PO TABS
1.0000 | ORAL_TABLET | ORAL | Status: DC | PRN
  Administered 2016-03-04: 2 via ORAL
  Filled 2016-03-03: qty 2

## 2016-03-03 MED ORDER — MIDAZOLAM HCL 5 MG/5ML IJ SOLN
INTRAMUSCULAR | Status: DC | PRN
  Administered 2016-03-03: 2 mg via INTRAVENOUS

## 2016-03-03 MED ORDER — CEFAZOLIN SODIUM-DEXTROSE 2-3 GM-% IV SOLR
INTRAVENOUS | Status: DC | PRN
  Administered 2016-03-03: 2 g via INTRAVENOUS

## 2016-03-03 MED ORDER — LAMOTRIGINE 200 MG PO TABS
200.0000 mg | ORAL_TABLET | Freq: Every day | ORAL | Status: DC
  Filled 2016-03-03: qty 1

## 2016-03-03 MED ORDER — ROCURONIUM BROMIDE 10 MG/ML (PF) SYRINGE
PREFILLED_SYRINGE | INTRAVENOUS | Status: DC | PRN
  Administered 2016-03-03: 50 mg via INTRAVENOUS
  Administered 2016-03-03: 10 mg via INTRAVENOUS

## 2016-03-03 MED ORDER — PRAZOSIN HCL 2 MG PO CAPS
2.0000 mg | ORAL_CAPSULE | Freq: Every day | ORAL | Status: DC
  Filled 2016-03-03: qty 1

## 2016-03-03 MED ORDER — 0.9 % SODIUM CHLORIDE (POUR BTL) OPTIME
TOPICAL | Status: DC | PRN
  Administered 2016-03-03: 1000 mL

## 2016-03-03 MED ORDER — OXYCODONE-ACETAMINOPHEN 5-325 MG PO TABS
1.0000 | ORAL_TABLET | ORAL | Status: DC | PRN
  Administered 2016-03-03: 2 via ORAL
  Filled 2016-03-03: qty 2

## 2016-03-03 MED ORDER — ZOLPIDEM TARTRATE 5 MG PO TABS: 5.0000 mg | ORAL_TABLET | Freq: Every evening | ORAL | Status: DC | PRN

## 2016-03-03 MED ORDER — CYCLOBENZAPRINE HCL 10 MG PO TABS: 10.0000 mg | ORAL_TABLET | Freq: Three times a day (TID) | ORAL | Status: DC | PRN

## 2016-03-03 MED ORDER — METHYLPREDNISOLONE ACETATE 80 MG/ML IJ SUSP
INTRAMUSCULAR | Status: DC | PRN
  Administered 2016-03-03: 80 mg

## 2016-03-03 MED ORDER — LACTATED RINGERS IV SOLN
INTRAVENOUS | Status: DC
  Administered 2016-03-03 (×2): via INTRAVENOUS

## 2016-03-03 MED ORDER — ALPRAZOLAM 0.5 MG PO TABS
0.5000 mg | ORAL_TABLET | Freq: Every day | ORAL | Status: DC
  Administered 2016-03-03: 1 mg via ORAL
  Filled 2016-03-03: qty 2

## 2016-03-03 MED ORDER — ACETAMINOPHEN 10 MG/ML IV SOLN
INTRAVENOUS | Status: DC | PRN
  Administered 2016-03-03: 1000 mg via INTRAVENOUS

## 2016-03-03 MED ORDER — SODIUM CHLORIDE 0.9% FLUSH
3.0000 mL | Freq: Two times a day (BID) | INTRAVENOUS | Status: DC
  Administered 2016-03-03 (×2): 3 mL via INTRAVENOUS

## 2016-03-03 MED ORDER — MENTHOL 3 MG MT LOZG: 1.0000 | LOZENGE | OROMUCOSAL | Status: DC | PRN

## 2016-03-03 MED ORDER — ACETAMINOPHEN 650 MG RE SUPP: 650.0000 mg | RECTAL | Status: DC | PRN

## 2016-03-03 MED ORDER — MAGNESIUM HYDROXIDE 400 MG/5ML PO SUSP: 30.0000 mL | Freq: Every day | ORAL | Status: DC | PRN

## 2016-03-03 SURGICAL SUPPLY — 64 items
ADH SKN CLS APL DERMABOND .7 (GAUZE/BANDAGES/DRESSINGS) ×1
APL SKNCLS STERI-STRIP NONHPOA (GAUZE/BANDAGES/DRESSINGS)
BAG DECANTER FOR FLEXI CONT (MISCELLANEOUS) ×2 IMPLANT
BENZOIN TINCTURE PRP APPL 2/3 (GAUZE/BANDAGES/DRESSINGS) IMPLANT
BLADE CLIPPER SURG (BLADE) IMPLANT
BUR ACORN 6.0 ACORN (BURR) IMPLANT
BUR ACRON 5.0MM COATED (BURR) ×1 IMPLANT
BUR MATCHSTICK NEURO 3.0 LAGG (BURR) ×2 IMPLANT
CANISTER SUCT 3000ML PPV (MISCELLANEOUS) ×2 IMPLANT
DERMABOND ADVANCED (GAUZE/BANDAGES/DRESSINGS) ×1
DERMABOND ADVANCED .7 DNX12 (GAUZE/BANDAGES/DRESSINGS) IMPLANT
DRAPE LAPAROTOMY 100X72X124 (DRAPES) ×2 IMPLANT
DRAPE MICROSCOPE LEICA (MISCELLANEOUS) ×2 IMPLANT
DRAPE POUCH INSTRU U-SHP 10X18 (DRAPES) ×2 IMPLANT
ELECT REM PT RETURN 9FT ADLT (ELECTROSURGICAL) ×2
ELECTRODE REM PT RTRN 9FT ADLT (ELECTROSURGICAL) ×1 IMPLANT
GAUZE SPONGE 4X4 12PLY STRL (GAUZE/BANDAGES/DRESSINGS) IMPLANT
GAUZE SPONGE 4X4 16PLY XRAY LF (GAUZE/BANDAGES/DRESSINGS) IMPLANT
GLOVE BIOGEL PI IND STRL 6.5 (GLOVE) IMPLANT
GLOVE BIOGEL PI IND STRL 7.5 (GLOVE) IMPLANT
GLOVE BIOGEL PI IND STRL 8 (GLOVE) ×1 IMPLANT
GLOVE BIOGEL PI IND STRL 8.5 (GLOVE) IMPLANT
GLOVE BIOGEL PI INDICATOR 6.5 (GLOVE) ×2
GLOVE BIOGEL PI INDICATOR 7.5 (GLOVE) ×1
GLOVE BIOGEL PI INDICATOR 8 (GLOVE) ×1
GLOVE BIOGEL PI INDICATOR 8.5 (GLOVE) ×1
GLOVE ECLIPSE 7.5 STRL STRAW (GLOVE) ×1 IMPLANT
GLOVE EXAM NITRILE LRG STRL (GLOVE) IMPLANT
GLOVE EXAM NITRILE XL STR (GLOVE) IMPLANT
GLOVE EXAM NITRILE XS STR PU (GLOVE) IMPLANT
GLOVE SURG SS PI 6.5 STRL IVOR (GLOVE) ×3 IMPLANT
GLOVE SURG SS PI 7.0 STRL IVOR (GLOVE) ×2 IMPLANT
GLOVE SURG SS PI 8.0 STRL IVOR (GLOVE) ×1 IMPLANT
GOWN STRL REUS W/ TWL LRG LVL3 (GOWN DISPOSABLE) ×1 IMPLANT
GOWN STRL REUS W/ TWL XL LVL3 (GOWN DISPOSABLE) IMPLANT
GOWN STRL REUS W/TWL 2XL LVL3 (GOWN DISPOSABLE) IMPLANT
GOWN STRL REUS W/TWL LRG LVL3 (GOWN DISPOSABLE) ×4
GOWN STRL REUS W/TWL XL LVL3 (GOWN DISPOSABLE) ×2
HEMOSTAT POWDER SURGIFOAM 1G (HEMOSTASIS) ×1 IMPLANT
KIT BASIN OR (CUSTOM PROCEDURE TRAY) ×2 IMPLANT
KIT ROOM TURNOVER OR (KITS) ×2 IMPLANT
NDL HYPO 18GX1.5 BLUNT FILL (NEEDLE) IMPLANT
NDL SPNL 18GX3.5 QUINCKE PK (NEEDLE) ×1 IMPLANT
NDL SPNL 22GX3.5 QUINCKE BK (NEEDLE) ×1 IMPLANT
NEEDLE HYPO 18GX1.5 BLUNT FILL (NEEDLE) IMPLANT
NEEDLE SPNL 18GX3.5 QUINCKE PK (NEEDLE) ×2 IMPLANT
NEEDLE SPNL 22GX3.5 QUINCKE BK (NEEDLE) ×2 IMPLANT
NS IRRIG 1000ML POUR BTL (IV SOLUTION) ×2 IMPLANT
PACK LAMINECTOMY NEURO (CUSTOM PROCEDURE TRAY) ×2 IMPLANT
PAD ARMBOARD 7.5X6 YLW CONV (MISCELLANEOUS) ×8 IMPLANT
PATTIES SURGICAL .5 X1 (DISPOSABLE) ×2 IMPLANT
RUBBERBAND STERILE (MISCELLANEOUS) ×4 IMPLANT
SPONGE LAP 4X18 X RAY DECT (DISPOSABLE) IMPLANT
SPONGE SURGIFOAM ABS GEL SZ50 (HEMOSTASIS) ×2 IMPLANT
STRIP CLOSURE SKIN 1/2X4 (GAUZE/BANDAGES/DRESSINGS) IMPLANT
SUT PROLENE 6 0 BV (SUTURE) IMPLANT
SUT VIC AB 1 CT1 18XBRD ANBCTR (SUTURE) ×1 IMPLANT
SUT VIC AB 1 CT1 8-18 (SUTURE) ×2
SUT VIC AB 2-0 CP2 18 (SUTURE) ×2 IMPLANT
SUT VIC AB 3-0 SH 8-18 (SUTURE) ×1 IMPLANT
SYR 5ML LL (SYRINGE) IMPLANT
TOWEL OR 17X24 6PK STRL BLUE (TOWEL DISPOSABLE) ×2 IMPLANT
TOWEL OR 17X26 10 PK STRL BLUE (TOWEL DISPOSABLE) ×2 IMPLANT
WATER STERILE IRR 1000ML POUR (IV SOLUTION) ×2 IMPLANT

## 2016-03-03 NOTE — Anesthesia Procedure Notes (Signed)
Procedure Name: Intubation Date/Time: 03/03/2016 10:46 AM Performed by: Everlean Cherry A Pre-anesthesia Checklist: Patient identified, Emergency Drugs available, Suction available and Patient being monitored Patient Re-evaluated:Patient Re-evaluated prior to inductionOxygen Delivery Method: Circle system utilized Preoxygenation: Pre-oxygenation with 100% oxygen Intubation Type: IV induction Ventilation: Mask ventilation without difficulty Laryngoscope Size: Miller and 2 Grade View: Grade I Tube type: Oral Tube size: 7.0 mm Number of attempts: 1 Airway Equipment and Method: Stylet Placement Confirmation: ETT inserted through vocal cords under direct vision,  positive ETCO2 and breath sounds checked- equal and bilateral Secured at: 23 cm Tube secured with: Tape Dental Injury: Teeth and Oropharynx as per pre-operative assessment

## 2016-03-03 NOTE — Progress Notes (Signed)
Vitals:   03/03/16 1329 03/03/16 1353 03/03/16 1420 03/03/16 1632  BP:   123/76 (!) 112/58  Pulse: 72 72 76 74  Resp: (!) 23 13 16 16   Temp:  97.9 F (36.6 C) 97.7 F (36.5 C) 97.7 F (36.5 C)  TempSrc:   Oral Oral  SpO2: 95% 99% 97% 94%  Weight:      Height:        CBC  Recent Labs  03/02/16 1431  WBC 5.2  HGB 13.5  HCT 42.0  PLT 197   BMET  Recent Labs  03/02/16 1431  NA 140  K 4.2  CL 103  CO2 30  GLUCOSE 115*  BUN 11  CREATININE 0.71  CALCIUM 9.2    Patient resting in bed, has ambulated in the halls, with the staff. Patient has voided twice. Wound clean and dry. No skin reaction to surgical prep. Moving all extremities well.  Plan: Doing well following surgery. Encouraged to ambulate. Continue to progress through postoperative recovery.  Hosie Spangle, MD 03/03/2016, 6:01 PM

## 2016-03-03 NOTE — Op Note (Signed)
03/03/2016  12:32 PM  PATIENT:  Candace Campbell  60 y.o. 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  ANESTHESIA:   general  EBL:  Total I/O In: 399 [I.V.:200; Blood:199] Out: 25 [Blood:25]  BLOOD ADMINISTERED:none  COUNT: Correct per nursing staff  DICTATION: Patient was brought to the operating room and placed under general endotracheal anesthesia. Patient was turned to prone position the lumbar region was prepped with Betadine soap and solution and draped in a sterile fashion. The midline was infiltrated with local anesthetic with epinephrine. A localizing x-ray was taken and the L4-5 level was identified. Midline incision was made over the L4-5 level and was carried down through the subcutaneous tissue to the lumbar fascia. The lumbar fascia was incised on the right side and the paraspinal muscles were dissected from the spinous processes and lamina in a subperiosteal fashion. Another x-ray was taken and the right L4-5 intralaminar space was identified. The operating microscope was draped and brought into the field provided additional magnification, illumination, and visualization. Laminotomy was performed using the high-speed drill and Kerrison punches. The ligamentum flavum was carefully resected. The underlying thecal sac and nerve root were identified. The disc herniation was identified and the thecal sac and nerve root gently retracted medially. The disc herniation with a free fragment disc herniation within the right L5 nerve root  foramen, compressing the exiting right L5 nerve root. The fragment was mobilized, and removed in a piecemeal fashion. All loose fragment of disc material removed, and good decompression of the thecal sac and nerve root was achieved. The annulus of the L4-5 disc was examined, the disc was degenerated, but there is no disc herniation at the disc space level and no intra-discal discectomy was performed. Once the discectomy was completed, hemostasis was established with the use of bipolar cautery and Gelfoam with thrombin. The Gelfoam was removed, a thin layer of Surgifoam applied, and hemostasis confirmed. We then instilled 2 cc of fentanyl and 80 mg of Depo-Medrol into the epidural space. Deep fascia was closed with interrupted undyed 1 Vicryl sutures. Scarpa's fascia was closed with interrupted undyed 2-0 Vicryl sutures in the subcutaneous and subcuticular layer were closed with interrupted inverted 3-0 undyed Vicryl sutures. The skin immediately around the incision was carefully washed with saline and dried. The skin edges were approximated with Dermabond. We then washed the remainder of the back completely cleaned of all Betadine solution. Then the patient was turned back to a supine position to be reversed from the anesthetic extubated and transferred to the recovery room for further care.   PLAN OF CARE: Admit for overnight observation  PATIENT DISPOSITION:  PACU - hemodynamically stable.   Delay start of Pharmacological VTE agent (>24hrs) due to surgical blood loss or risk of bleeding:  yes

## 2016-03-03 NOTE — Progress Notes (Signed)
Spoke with Dr. Sherwood Gambler, given order for 1 unit platelets to be given by CRNA immediately prior to surgery. Blood bank notified. Platelets taken to [facility] OR by Rolla Flatten and CRNA notified. Pt transported to surgery.

## 2016-03-03 NOTE — H&P (Signed)
Subjective: Patient is a 60 y.o. right-handed white female who is admitted for treatment of right L4-5 lumbar disc herniation with free fragment has migrated caudally behind the body of L5 into the right L5 nerve root foramen.  Exam shows right foot drop, and symptomatically she is having right lumbar radicular pain syndrome. Patient admitted now for a right L4-5 lumbar laminotomy and microdiscectomy.   Patient Active Problem List   Diagnosis Date Noted  . At high risk for bleeding 03/19/2015  . 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 female breast ([place]) 03/17/2015   Past Medical History:  Diagnosis Date  . Anxiety   . Blood dyscrasia    states daughter has Von Willebrand and she was tested 03-19-15 by Dr Lindi Adie  . Breast cancer ([place]) 03/13/15   right breast   . Depression   . GERD (gastroesophageal reflux disease)   . Headache    HX MIGRAINES  . History of kidney stones   . PONV (postoperative nausea and vomiting)     Past Surgical History:  Procedure Laterality Date  . ABDOMINAL HYSTERECTOMY    . BREAST LUMPECTOMY Left   . BREAST LUMPECTOMY WITH RADIOACTIVE SEED AND SENTINEL LYMPH NODE BIOPSY Right 03/26/2015   Procedure: BREAST LUMPECTOMY WITH RADIOACTIVE SEED AND SENTINEL LYMPH NODE BIOPSY;  Surgeon: [name], MD;  Location: [facility];  Service: General;  Laterality: Right;  . BUNIONECTOMY     BILAT    Prescriptions Prior to Admission  Medication Sig Dispense Refill Last Dose  . alendronate (FOSAMAX) 70 MG tablet Take 1 tablet (70 mg total) by mouth once a week. Take with a full glass of water on an empty stomach. (Patient taking differently: Take 70 mg by mouth once a week. Monday. Take with a full glass of water on an empty stomach.) 12 tablet 3 03/01/2016  . ALPRAZolam (XANAX) 1 MG tablet Take 0.5-1 mg by mouth at bedtime.    03/02/2016 at Unknown time  . anastrozole (ARIMIDEX) 1 MG tablet Take 1 tablet (1 mg total) by mouth daily. 90 tablet 3 03/03/2016  at 0600  . DYMISTA 137-50 MCG/ACT SUSP Place 2 sprays into both nostrils at bedtime. Reported on 09/09/2015   03/02/2016 at Unknown time  . HYDROcodone-acetaminophen (NORCO) 10-325 MG tablet Take 1 tablet by mouth at bedtime.   03/02/2016 at Unknown time  . lamoTRIgine (LAMICTAL) 200 MG tablet Take 200 mg by mouth at bedtime.    03/02/2016 at Unknown time  . Multiple Vitamin (MULTIVITAMIN WITH MINERALS) TABS tablet Take 1 tablet by mouth daily.   03/02/2016 at Unknown time  . omeprazole (PRILOSEC OTC) 20 MG tablet Take 20 mg by mouth daily.   03/03/2016 at 0600  . omeprazole-sodium bicarbonate (ZEGERID) 40-1100 MG per capsule Take 1 capsule by mouth at bedtime.  3 03/02/2016 at Unknown time  . Phosphatidylserine-DHA-EPA (VAYARIN PO) Take 2 tablets by mouth every morning.    03/02/2016 at Unknown time  . prazosin (MINIPRESS) 1 MG capsule Take 2 mg by mouth at bedtime.    03/02/2016 at Unknown time  . Vortioxetine HBr (TRINTELLIX) 20 MG TABS Take 20 mg by mouth daily.   03/03/2016 at 0600   Allergies  Allergen Reactions  . Latex Swelling  . Clindamycin/Lincomycin Hives  . Peanut-Containing Drug Products Other (See Comments)    Headache/migraine  . Sulfa Antibiotics Other (See Comments)    UNSPECIFIED REACTION Pt does not remember the symptoms "They told me I should not take it anymore".    Marland Kitchen  Iodine Swelling and Rash    Including Betadine     Social History  Substance Use Topics  . Smoking status: Never Smoker  . Smokeless tobacco: Never Used  . Alcohol use 0.0 oz/week     Comment: social    Family History  Problem Relation Age of Onset  . Breast cancer Maternal Grandmother      Review of Systems A comprehensive review of systems was negative.  Objective: Vital signs in last 24 hours: Temp:  [98 F (36.7 C)] 98 F (36.7 C) (08/30 0741) Pulse Rate:  [64-73] 64 (08/30 0741) Resp:  [20] 20 (08/30 0741) BP: (120-130)/(76-78) 120/78 (08/30 0741) SpO2:  [97 %-99 %] 97 % (08/30  0741) Weight:  [60.8 kg (134 lb)-60.9 kg (134 lb 4.8 oz)] 60.8 kg (134 lb) (08/30 0741)  EXAM: Patient well-developed well-nourished white female in no acute distress. Lungs are clear to auscultation , the patient has symmetrical respiratory excursion. Heart has a regular rate and rhythm normal S1 and S2 no murmur.   Abdomen is soft nontender nondistended bowel sounds are present. Extremity examination shows no clubbing cyanosis or edema. Motor examination shows 5/5 strength through the left lower extremity including the iliopsoas, quadriceps, dorsiflexor, EHL, and plantar flexor. The right lower extremity strength shows the iliopsoas and quadriceps are 5, the dorsiflexor is 3-4 minus, the EHL is 3, and the plantar flexor is 4. Sensation is intact to pinprick in the thighs, legs, and feet bilaterally. Reflexes are 2+ at the quadriceps, left gastrocnemius 2, the right gastrocnemius 1-2, toes downgoing bilaterally. Gait and stance both favor the right lower extremity.  Data Review:CBC    Component Value Date/Time   WBC 5.2 03/02/2016 1431   RBC 4.43 03/02/2016 1431   HGB 13.5 03/02/2016 1431   HGB 14.2 03/19/2015 0847   HCT 42.0 03/02/2016 1431   HCT 42.7 03/19/2015 0847   PLT 197 03/02/2016 1431   PLT 215 03/19/2015 0847   MCV 94.8 03/02/2016 1431   MCV 93.6 03/19/2015 0847   MCH 30.5 03/02/2016 1431   MCHC 32.1 03/02/2016 1431   RDW 13.3 03/02/2016 1431   RDW 13.4 03/19/2015 0847   LYMPHSABS 1.4 03/29/2015 1619   LYMPHSABS 1.1 03/19/2015 0847   MONOABS 1.0 03/29/2015 1619   MONOABS 0.4 03/19/2015 0847   EOSABS 0.1 03/29/2015 1619   EOSABS 0.1 03/19/2015 0847   BASOSABS 0.0 03/29/2015 1619   BASOSABS 0.0 03/19/2015 0847                          BMET    Component Value Date/Time   NA 140 03/02/2016 1431   NA 142 03/19/2015 0847   K 4.2 03/02/2016 1431   K 3.7 03/19/2015 0847   CL 103 03/02/2016 1431   CO2 30 03/02/2016 1431   CO2 30 (H) 03/19/2015 0847   GLUCOSE 115 (H)  03/02/2016 1431   GLUCOSE 123 03/19/2015 0847   BUN 11 03/02/2016 1431   BUN 10.4 03/19/2015 0847   CREATININE 0.71 03/02/2016 1431   CREATININE 0.8 03/19/2015 0847   CALCIUM 9.2 03/02/2016 1431   CALCIUM 9.4 03/19/2015 0847   GFRNONAA >60 03/02/2016 1431   GFRAA >60 03/02/2016 1431     Assessment/Plan: Patient with right lumbar radiculopathy and right foot drop, has been found to have a right L4-5 lumbar disc herniation, with a free fragment 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 Patient is admitted for a right L4-5 lumbar laminotomy and micro-discectomy.  I've discussed with the patient the nature of his condition, the nature the surgical procedure, the typical length of surgery, hospital stay, and overall recuperation. We discussed limitations postoperatively. I discussed risks of surgery including risks of infection, bleeding, possibly need for transfusion, the risk of nerve root dysfunction with pain, weakness, numbness, or paresthesias, or risk of dural tear and CSF leakage and possible need for further surgery, the risk of recurrent disc herniation and the possible need for further surgery, and the risk of anesthetic complications including myocardial infarction, stroke, pneumonia, and death. Understanding all this the patient does wish to proceed with surgery and is admitted for such.    Hosie Spangle, MD 03/03/2016 10:36 AM

## 2016-03-03 NOTE — Anesthesia Postprocedure Evaluation (Signed)
Anesthesia Post Note  Patient: Candace Campbell  Procedure(s) Performed: Procedure(s) (LRB): RIGHT LUMBAR FOUR-FIVE LUMBAR LAMINECTOMYAND MICRODISCECTOMY (Right)  Patient location during evaluation: PACU Anesthesia Type: General Level of consciousness: sedated Pain management: satisfactory to patient Vital Signs Assessment: post-procedure vital signs reviewed and stable Respiratory status: spontaneous breathing Cardiovascular status: stable Anesthetic complications: no    Last Vitals:  Vitals:   03/03/16 1353 03/03/16 1420  BP:  123/76  Pulse: 72 76  Resp: 13 16  Temp: 36.6 C 36.5 C    Last Pain:  Vitals:   03/03/16 1420  TempSrc: Oral  PainSc:                  Riccardo Dubin

## 2016-03-03 NOTE — Progress Notes (Signed)
Currently no orders from Dr. Sherwood Gambler signed in computer. Question whether pt requires platelets prior to surgery. Attempted to page Dr. Sherwood Gambler x2. Left message at Surgical center to have Dr. Sherwood Gambler call me when he arrives. Dr. Seward Speck notified as well.

## 2016-03-03 NOTE — Transfer of Care (Signed)
Immediate Anesthesia Transfer of Care Note  Patient: Candace Campbell  Procedure(s) Performed: Procedure(s) with comments: RIGHT LUMBAR FOUR-FIVE LUMBAR LAMINECTOMYAND MICRODISCECTOMY (Right) - right  Patient Location: PACU  Anesthesia Type:General  Level of Consciousness: awake, alert , oriented and patient cooperative  Airway & Oxygen Therapy: Patient Spontanous Breathing  Post-op Assessment: Report given to RN and Post -op Vital signs reviewed and stable  Post vital signs: Reviewed and stable  Last Vitals:  Vitals:   03/03/16 0741 03/03/16 1248  BP: 120/78   Pulse: 64   Resp: 20   Temp: 36.7 C (P) 36.4 C    Last Pain:  Vitals:   03/03/16 0741  TempSrc: Oral         Complications: No apparent anesthesia complications

## 2016-03-04 ENCOUNTER — Encounter (HOSPITAL_COMMUNITY): Payer: Self-pay | Admitting: Neurosurgery

## 2016-03-04 DIAGNOSIS — M5116 Intervertebral disc disorders with radiculopathy, lumbar region: Secondary | ICD-10-CM | POA: Diagnosis not present

## 2016-03-04 LAB — PREPARE PLATELET PHERESIS: Unit division: 0

## 2016-03-04 MED ORDER — HYDROCODONE-ACETAMINOPHEN 5-325 MG PO TABS: 1.0000 | ORAL_TABLET | ORAL | 0 refills | Status: DC | PRN

## 2016-03-04 NOTE — Progress Notes (Signed)
Patient alert and oriented, mae's well, voiding adequate amount of urine, swallowing without difficulty, no c/o pain. Patient discharged home with family. Script and discharged instructions given to patient. Patient and family stated understanding of d/c instructions given and has an appointment with MD. 

## 2016-03-04 NOTE — Discharge Instructions (Signed)

## 2016-03-04 NOTE — Discharge Summary (Signed)
Physician Discharge Summary  Patient ID: Candace Campbell MRN: [id number] DOB/AGE: [birth year] 60 y.o.  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 She did well with surgery. She was given 1 unit of platelets at the time of surgery. She had no bleeding difficulties during surgery, nor were there any bleeding difficulties following surgery. We used Betadine soap and solution for prep. It was washed off at the end of surgery. She had no skin reaction to the prep. (She had previously been told that she had a allergy to topical iodine, but clearly had no reaction). She is up and ambulate actively. She is voiding well. Her incision is healing nicely. She is anxious to be discharged home. She has been given instructions regarding wound care and activities following discharge. She's been instructed when to return for follow-up (3 weeks). She is to contact us if she has any difficulties prior to that appointment.  Discharge Exam: Blood pressure (!) 103/48, pulse 67, temperature 98.7 F (37.1 C), resp. rate 18, height 5' 4.75" (1.645 m), weight 60.8 kg (134 lb), SpO2 98 %.  Disposition: 01-Home or Self Care     Medication List    TAKE these medications   alendronate 70 MG tablet Commonly known as:  FOSAMAX Take 1 tablet (70 mg total) by mouth once a week. Take with a full glass of water on an empty stomach. What changed:  additional instructions   ALPRAZolam 1 MG tablet Commonly known as:  XANAX Take 0.5-1 mg by  mouth at bedtime.   anastrozole 1 MG tablet Commonly known as:  ARIMIDEX Take 1 tablet (1 mg total) by mouth daily.   DYMISTA 137-50 MCG/ACT Susp Generic drug:  Azelastine-Fluticasone Place 2 sprays into both nostrils at bedtime. Reported on 09/09/2015   HYDROcodone-acetaminophen 10-325 MG tablet Commonly known as:  NORCO Take 1 tablet by mouth at bedtime. What changed:  Another medication with the same name was added. Make sure you understand how and when to take each.   HYDROcodone-acetaminophen 5-325 MG tablet Commonly known as:  NORCO/VICODIN Take 1-2 tablets by mouth every 4 (four) hours as needed (mild pain). What changed:  You were already taking a medication with the same name, and this prescription was added. Make sure you understand how and when to take each.   lamoTRIgine 200 MG tablet Commonly known as:  LAMICTAL Take 200 mg by mouth at bedtime.   multivitamin with minerals Tabs tablet Take 1 tablet by mouth daily.   omeprazole 20 MG tablet Commonly known as:  PRILOSEC OTC Take 20 mg by mouth daily.   omeprazole-sodium bicarbonate 40-1100 MG capsule Commonly known as:  ZEGERID Take 1 capsule by mouth at bedtime.   prazosin 1 MG capsule Commonly known as:  MINIPRESS Take 2 mg by mouth at bedtime.   TRINTELLIX 20 MG Tabs Generic drug:  vortioxetine HBr Take 20 mg by mouth daily.   VAYARIN PO Take 2 tablets by mouth every morning.        SignedHosie Spangle 03/04/2016, 3:05 PM

## 2016-04-06 ENCOUNTER — Ambulatory Visit
Admit: 2016-04-06 | Discharge: 2016-04-06 | Disposition: A | Discharge status: Home / Self Care | Payer: BC Managed Care – PPO | Attending: General Surgery | Admitting: General Surgery

## 2016-04-06 ENCOUNTER — Other Ambulatory Visit: Payer: Self-pay | Admitting: General Surgery

## 2016-04-06 ENCOUNTER — Ambulatory Visit: Payer: BC Managed Care – PPO | Attending: Neurosurgery | Admitting: Physical Therapy

## 2016-04-06 ENCOUNTER — Ambulatory Visit
Admission: RE | Admit: 2016-04-06 | Discharge: 2016-04-06 | Disposition: A | Discharge status: Home / Self Care | Payer: BC Managed Care – PPO | Source: Ambulatory Visit | Attending: General Surgery | Admitting: General Surgery

## 2016-04-06 ENCOUNTER — Encounter: Payer: Self-pay | Admitting: Physical Therapy

## 2016-04-06 DIAGNOSIS — C50511 Malignant neoplasm of lower-outer quadrant of right female breast: Secondary | ICD-10-CM | POA: Diagnosis present

## 2016-04-06 DIAGNOSIS — M6281 Muscle weakness (generalized): Secondary | ICD-10-CM | POA: Diagnosis present

## 2016-04-06 DIAGNOSIS — R490 Dysphonia: Secondary | ICD-10-CM | POA: Diagnosis present

## 2016-04-06 DIAGNOSIS — Z853 Personal history of malignant neoplasm of breast: Secondary | ICD-10-CM

## 2016-04-06 DIAGNOSIS — M545 Low back pain, unspecified: Secondary | ICD-10-CM

## 2016-04-06 NOTE — Therapy (Signed)
[facility] [address] [place], [place], [address] Phone: [phone]   Fax:  [phone]  Physical Therapy Evaluation  Patient Details  Name: Candace Campbell MRN: [id number] Date of Birth: [birth year] Referring Provider: Jovita Gamma, MD  Encounter Date: 04/06/2016      PT End of Session - 04/06/16 1407    Visit Number 1   Number of Visits 13   Date for PT Re-Evaluation 06/06/16   PT Start Time 1100   PT Stop Time 1150   PT Time Calculation (min) 50 min   Activity Tolerance Patient tolerated treatment well   Behavior During Therapy [facility] for tasks assessed/performed      Past Medical History:  Diagnosis Date  . Anxiety   . Blood dyscrasia    states daughter has Von Willebrand and she was tested 03-19-15 by Dr Lindi Adie  . Breast cancer ([place]) 03/13/15   right breast   . Depression   . GERD (gastroesophageal reflux disease)   . Headache    HX MIGRAINES  . History of kidney stones   . PONV (postoperative nausea and vomiting)     Past Surgical History:  Procedure Laterality Date  . ABDOMINAL HYSTERECTOMY    . BREAST LUMPECTOMY Left   . BREAST LUMPECTOMY WITH RADIOACTIVE SEED AND SENTINEL LYMPH NODE BIOPSY Right 03/26/2015   Procedure: BREAST LUMPECTOMY WITH RADIOACTIVE SEED AND SENTINEL LYMPH NODE BIOPSY;  Surgeon: [name], MD;  Location: [place];  Service: General;  Laterality: Right;  . BUNIONECTOMY     BILAT  . LUMBAR LAMINECTOMY/DECOMPRESSION MICRODISCECTOMY Right 03/03/2016   Procedure: RIGHT LUMBAR FOUR-FIVE LUMBAR LAMINECTOMYAND MICRODISCECTOMY;  Surgeon: Jovita Gamma, MD;  Location: [place] [facility];  Service: Neurosurgery;  Laterality: Right;  right    There were no vitals filed for this visit.       Subjective Assessment - 04/06/16 1106    Subjective Pt reporting mild back pain s/p laminectomy and microdiscectomy on 03/03/16. Pain is worse with sitting. Pt reports she is still  under lifting precaustions of 5 pounds.    Pertinent History breast CA, lumpectomy on R side s/p radiation   Limitations Sitting;House hold activities;Lifting   How long can you sit comfortably? 30 minutes   How long can you stand comfortably? unlimited   How long can you walk comfortably? unlimited   Diagnostic tests MRI prior to surgery   Patient Stated Goals Stop hurting, get stronger, build core strength, pt interested in yoga and pilates   Currently in Pain? Yes   Pain Score 3    Pain Location Back   Pain Orientation Lower   Pain Descriptors / Indicators Aching   Pain Type Acute pain;Surgical pain   Pain Onset 1 to 4 weeks ago   Pain Frequency Constant   Aggravating Factors  lifting, reaching, bending   Pain Relieving Factors pain meds intermittent more at nigth to sleep, standing, walking   Multiple Pain Sites No            [facility] PT Assessment - 04/06/16 0001      Assessment   Medical Diagnosis L4-L5 laminectomy discectomy   Referring Provider Jovita Gamma, MD   Onset Date/Surgical Date 03/03/16   Hand Dominance Right   Next MD Visit about 2/5 months   Prior Therapy none     Precautions   Precautions Back   Precaution Comments Pt reporting lifting restrictions     Restrictions   Weight Bearing Restrictions No  Balance Screen   Has the patient fallen in the past 6 months Yes   How many times? 1  pt fell walking tripped over a root in the ground   Has the patient had a decrease in activity level because of a fear of falling?  No   Is the patient reluctant to leave their home because of a fear of falling?  No     Home Social worker Private residence   Living Arrangements Spouse/significant other;Children;Other relatives   Available Help at Discharge Family   Type of [place] to enter   Entrance Stairs-Number of Steps 1   Entrance Stairs-Rails None   Home Layout One level     Prior Function   Level of  Independence Independent     Cognition   Overall Cognitive Status Within Functional Limits for tasks assessed     Observation/Other Assessments   Focus on Therapeutic Outcomes (FOTO)  46% limitation     Coordination   Heel Shin Test intact     ROM / Strength   AROM / PROM / Strength AROM;Strength     AROM   AROM Assessment Site Lumbar   Right/Left Hip --   Lumbar Flexion 55 degrees    Lumbar Extension 0  Pt compensentating with her thoracic spine   Lumbar - Right Side Bend 15 degrees   Lumbar - Left Side Bend 30 degrees     Strength   Strength Assessment Site Hip;Knee;Ankle   Right/Left Hip Right;Left   Right Hip Flexion 4-/5   Right Hip Extension 3/5   Right Hip ABduction 3/5   Left Hip Flexion 5/5   Left Hip Extension 4/5   Left Hip ABduction 4/5   Left Hip ADduction 4/5   Right/Left Knee Right;Left   Right Knee Flexion 4/5   Right Knee Extension 4/5   Left Knee Flexion 5/5   Left Knee Extension 5/5   Right/Left Ankle Right;Left   Right Ankle Dorsiflexion 4/5   Right Ankle Plantar Flexion 4/5   Left Ankle Dorsiflexion 5/5   Left Ankle Plantar Flexion 5/5     Ambulation/Gait   Ambulation/Gait Yes   Gait Pattern Step-through pattern;Decreased stride length                           PT Education - 04/06/16 1116    Education provided Yes   Education Details sitting posture edu, HEP, walking program   Person(s) Educated Patient   Methods Explanation;Demonstration;Handout   Comprehension Verbalized understanding;Returned demonstration          PT Short Term Goals - 04/06/16 1432      PT SHORT TERM GOAL #1   Title Pt will be independent in her home excercise program   Baseline issued 04-06-2016   Time 3   Period Weeks   Status New     PT SHORT TERM GOAL #2   Title Pt will report pain of </= 3/10 with functional ADLs   Time 3   Period Weeks   Status New           PT Long Term Goals - 04/06/16 1435      PT LONG TERM GOAL #1    Title Pt will be independent with HEP progression.   Time 6   Period Weeks   Status New     PT LONG TERM GOAL #2   Title Pt will improve  FOTO score from 46 percent limitation to </= 36 percent limitation in order to improve functional mobility.   Time 6   Period Weeks   Status New     PT LONG TERM GOAL #3   Title Pt will improve right LE Hip flexion, abduction and extension to 5/5 in order to improve gait and functional transfers.   Time 6   Period Weeks   Status New     PT LONG TERM GOAL #4   Title Pt will be able to pick object up off the floor using correct body mechanics with no pain reported.   Time 6   Period Weeks   Status New               Plan - 04/06/16 1408    Clinical Impression Statement Pt presents with decreased ROM, Strength and core stability following a L4-5 laminectomy and discectomy on 03/03/16. Pt reporting intermittent pain with sitting, bending, and lifting. Skilled physical therapy to benefit pt to assist with improved functional mobility, ROM, strength and decrease pain.    Rehab Potential Excellent   PT Frequency 2x / week   PT Duration 6 weeks   PT Treatment/Interventions ADLs/Self Care Home Management;Patient/family education;Taping;Dry needling;Cryotherapy;Electrical Stimulation;Iontophoresis 4mg /ml Dexamethasone;Moist Heat;Neuromuscular re-education;Therapeutic exercise;Therapeutic activities;Functional mobility training;Gait training;Stair training;Manual techniques;Passive range of motion   PT Next Visit Plan lumbar strengthening, ROM, core stability   PT Home Exercise Plan PPT, bridges, supine marching   Consulted and Agree with Plan of Care Patient      Patient will benefit from skilled therapeutic intervention in order to improve the following deficits and impairments:  Decreased activity tolerance, Decreased strength, Pain, Difficulty walking, Decreased balance, Decreased mobility, Postural dysfunction, Impaired perceived functional  ability, Decreased range of motion  Visit Diagnosis: Acute midline low back pain without sciatica  Muscle weakness (generalized)     Problem List Patient Active Problem List   Diagnosis Date Noted  . HNP (herniated nucleus pulposus), lumbar 03/03/2016  . At high risk for bleeding 03/19/2015  . 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 female breast ([place]) 03/17/2015    Oretha Caprice, MPT 04/06/2016, 2:42 PM  [facility] [address], [place], [place], [address] Phone: [phone]   Fax:  [phone]  Name: THEIA FABER MRN: [id number] Date of Birth: 28-May-1956

## 2016-04-06 NOTE — Patient Instructions (Signed)
Bridge    Lie back, legs bent. Perform Posterior tilt prior to lifting.  Inhale, pressing hips up. Keeping ribs in, lengthen lower back. Exhale, rolling down along spine from top. Repeat ___15_ times. Do _2___ sessions per day.     (Home) Flexion: Pelvic Tilt    Lie with neck supported, knees bent, feet flat. Tighten and suck stomach in, pushing back down against surface. Do not push down with legs. Repeat _15___ times per set. Hold 5 seconds Do ___2_ sessions per week.    Lye flat on  your back bring one knee toward your chest at a time (like you are marching) 20 times each leg

## 2016-04-08 ENCOUNTER — Encounter: Payer: BC Managed Care – PPO | Admitting: Physical Therapy

## 2016-04-13 ENCOUNTER — Ambulatory Visit: Payer: BC Managed Care – PPO | Admitting: Physical Therapy

## 2016-04-13 ENCOUNTER — Encounter: Payer: Self-pay | Admitting: Physical Therapy

## 2016-04-13 DIAGNOSIS — C50511 Malignant neoplasm of lower-outer quadrant of right female breast: Secondary | ICD-10-CM

## 2016-04-13 DIAGNOSIS — M545 Low back pain, unspecified: Secondary | ICD-10-CM

## 2016-04-13 DIAGNOSIS — M6281 Muscle weakness (generalized): Secondary | ICD-10-CM

## 2016-04-13 DIAGNOSIS — R490 Dysphonia: Secondary | ICD-10-CM

## 2016-04-13 NOTE — Therapy (Addendum)
[facility] [address] [place], [place], [address] Phone: [phone]   Fax:  [phone]  Physical Therapy Treatment  Patient Details  Name: Candace Campbell MRN: [id number] Date of Birth: [birth year] Referring Provider: Jovita Gamma, MD  Encounter Date: 04/13/2016      PT End of Session - 04/13/16 1404    Visit Number 2   Number of Visits 13   Date for PT Re-Evaluation 06/06/16   PT Start Time 1400   PT Stop Time 1501   PT Time Calculation (min) 61 min   Activity Tolerance Patient tolerated treatment well   Behavior During Therapy [facility] for tasks assessed/performed      Past Medical History:  Diagnosis Date  . Anxiety   . Blood dyscrasia    states daughter has Von Willebrand and she was tested 03-19-15 by Dr Lindi Adie  . Breast cancer ([place]) 03/13/15   right breast   . Depression   . GERD (gastroesophageal reflux disease)   . Headache    HX MIGRAINES  . History of kidney stones   . PONV (postoperative nausea and vomiting)     Past Surgical History:  Procedure Laterality Date  . ABDOMINAL HYSTERECTOMY    . BREAST LUMPECTOMY Left   . BREAST LUMPECTOMY WITH RADIOACTIVE SEED AND SENTINEL LYMPH NODE BIOPSY Right 03/26/2015   Procedure: BREAST LUMPECTOMY WITH RADIOACTIVE SEED AND SENTINEL LYMPH NODE BIOPSY;  Surgeon: [name], MD;  Location: [place];  Service: General;  Laterality: Right;  . BUNIONECTOMY     BILAT  . LUMBAR LAMINECTOMY/DECOMPRESSION MICRODISCECTOMY Right 03/03/2016   Procedure: RIGHT LUMBAR FOUR-FIVE LUMBAR LAMINECTOMYAND MICRODISCECTOMY;  Surgeon: Jovita Gamma, MD;  Location: [place] [facility];  Service: Neurosurgery;  Laterality: Right;  right    There were no vitals filed for this visit.      Subjective Assessment - 04/13/16 1402    Subjective Pt reports having back pain yesterday after prolonged sitting but feeling better today.    Pertinent History breast CA,  lumpectomy on R side s/p radiation   Limitations Sitting;House hold activities;Lifting   How long can you sit comfortably? 30 minutes   How long can you stand comfortably? unlimited   How long can you walk comfortably? unlimited   Diagnostic tests MRI prior to surgery   Patient Stated Goals Stop hurting, get stronger, build core strength, pt interested in yoga and pilates   Currently in Pain? Yes   Pain Score 4    Pain Location Back   Pain Orientation Lower   Pain Descriptors / Indicators Aching   Pain Type Acute pain;Surgical pain   Pain Onset 1 to 4 weeks ago   Pain Frequency Constant   Multiple Pain Sites No                         [facility] Adult PT Treatment/Exercise - 04/13/16 0001      Exercises   Exercises Knee/Hip;Lumbar     Lumbar Exercises: Seated   Long Arc Quad on Chair Strengthening;Both;2 sets;10 reps   LAQ on Chair Weights (lbs) 2#   Sit to Stand 10 reps   Other Seated Lumbar Exercises Hamstring curl     Lumbar Exercises: Supine   Ab Set 20 reps  with red ball   Clam 20 reps   Bent Knee Raise 20 reps   Bridge 20 reps   Other Supine Lumbar Exercises Ball squeeze x20   Other  Supine Lumbar Exercises Supine shoulder extention  with abdomoinal brace yellow tband     Knee/Hip Exercises: Stretches   Active Hamstring Stretch Both;2 reps;10 seconds  with strap verbal cues for stright knee   Knee: Self-Stretch to increase Flexion Both;2 reps;20 seconds   ITB Stretch Both;2 reps;20 seconds   Piriformis Stretch 2 reps;Both;10 seconds     Modalities   Modalities Electrical Stimulation;Moist Heat     Moist Heat Therapy   Number Minutes Moist Heat 15 Minutes   Moist Heat Location Lumbar Spine     Electrical Stimulation   Electrical Stimulation Location low back   Electrical Stimulation Action IFC   Electrical Stimulation Parameters to tolerance   Electrical Stimulation Goals Pain                  PT Short Term Goals - 04/13/16 1404       PT SHORT TERM GOAL #1   Title Pt will be independent in her home excercise program   Baseline issued 04-06-2016   Time 3   Period Weeks   Status On-going     PT SHORT TERM GOAL #2   Title Pt will report pain of </= 3/10 with functional ADLs   Time 3   Period Weeks   Status On-going           PT Long Term Goals - 04/13/16 1404      PT LONG TERM GOAL #1   Title Pt will be independent with HEP progression.   Time 6   Period Weeks   Status On-going     PT LONG TERM GOAL #2   Title Pt will improve FOTO score from 46 percent limitation to </= 36 percent limitation in order to improve functional mobility.   Time 6   Period Weeks   Status On-going     PT LONG TERM GOAL #3   Title Pt will improve right LE Hip flexion, abduction and extension to 5/5 in order to improve gait and functional transfers.   Time 6   Period Weeks   Status On-going     PT LONG TERM GOAL #4   Title Pt will be able to pick object up off the floor using correct body mechanics with no pain reported.   Time 6   Period Weeks   Status On-going      Pt presents with Rt hip and low back pain s/p lamenectomy. Pt has decreased abdominal strength and hyper mobile joints. Pt able to tolerate all strengthening exercises well with verbal cues to contract core muscles during exercises. Pt will continue to benefit from skilled therapy for core and hip strengthening and stability.  Plan next visit: Body mechanics and squat technique, progress to low intensity standing exercises.  Addended Candace Campbell, PTA 04/13/16 3:31 PM       Patient will benefit from skilled therapeutic intervention in order to improve the following deficits and impairments:     Visit Diagnosis: Acute midline low back pain without sciatica  Muscle weakness (generalized)  Carcinoma of lower-outer quadrant of right female breast, unspecified estrogen receptor status ([place])  Dysphonia     Problem List Patient Active  Problem List   Diagnosis Date Noted  . HNP (herniated nucleus pulposus), lumbar 03/03/2016  . At high risk for bleeding 03/19/2015  . 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 female breast ([place]) 03/17/2015    Candace Campbell PTA 04/13/2016, 3:25 PM  [facility] [address]  [address] [place], [place], [address] Phone: [phone]   Fax:  [phone]  Name: Candace Campbell MRN: [id number] Date of Birth: [birth year]

## 2016-04-15 ENCOUNTER — Ambulatory Visit: Payer: BC Managed Care – PPO | Admitting: Physical Therapy

## 2016-04-15 DIAGNOSIS — M6281 Muscle weakness (generalized): Secondary | ICD-10-CM

## 2016-04-15 DIAGNOSIS — M545 Low back pain, unspecified: Secondary | ICD-10-CM

## 2016-04-15 NOTE — Therapy (Signed)
[facility] [address], [place], [place], [address] Phone: [phone]   Fax:  [phone]  Physical Therapy Treatment  Patient Details  Name: Candace Campbell MRN: [id number] Date of Birth: 05/03/1956 Referring Provider: Jovita Gamma, MD  Encounter Date: 04/15/2016      PT End of Session - 04/15/16 0935    Visit Number 3   Number of Visits 13   Date for PT Re-Evaluation 06/06/16   PT Start Time 0925   PT Stop Time 1015   PT Time Calculation (min) 50 min   Activity Tolerance Patient tolerated treatment well   Behavior During Therapy [facility] for tasks assessed/performed      Past Medical History:  Diagnosis Date  . Anxiety   . Blood dyscrasia    states daughter has Von Willebrand and she was tested 03-19-15 by Dr Lindi Adie  . Breast cancer ([place]) 03/13/15   right breast   . Depression   . GERD (gastroesophageal reflux disease)   . Headache    HX MIGRAINES  . History of kidney stones   . PONV (postoperative nausea and vomiting)     Past Surgical History:  Procedure Laterality Date  . ABDOMINAL HYSTERECTOMY    . BREAST LUMPECTOMY Left   . BREAST LUMPECTOMY WITH RADIOACTIVE SEED AND SENTINEL LYMPH NODE BIOPSY Right 03/26/2015   Procedure: BREAST LUMPECTOMY WITH RADIOACTIVE SEED AND SENTINEL LYMPH NODE BIOPSY;  Surgeon: [name], MD;  Location: [place];  Service: General;  Laterality: Right;  . BUNIONECTOMY     BILAT  . LUMBAR LAMINECTOMY/DECOMPRESSION MICRODISCECTOMY Right 03/03/2016   Procedure: RIGHT LUMBAR FOUR-FIVE LUMBAR LAMINECTOMYAND MICRODISCECTOMY;  Surgeon: Jovita Gamma, MD;  Location: [place] [facility];  Service: Neurosurgery;  Laterality: Right;  right    There were no vitals filed for this visit.      Subjective Assessment - 04/15/16 0928    Subjective pt reporting 3-4/10 pain in her rigth low back hip area.    Pertinent History breast CA, lumpectomy on R side s/p radiation   Diagnostic tests MRI prior to surgery   Patient Stated Goals Stop hurting, get stronger, build core strength, pt interested in yoga and pilates   Currently in Pain? Yes   Pain Score 4    Pain Location Back   Pain Orientation Right;Lower   Pain Descriptors / Indicators Aching   Pain Onset 1 to 4 weeks ago   Pain Frequency Constant   Aggravating Factors  lifting, reaching, bending, twisting   Pain Relieving Factors pain meds, walking   Multiple Pain Sites No                         [facility] Adult PT Treatment/Exercise - 04/15/16 0001      Lumbar Exercises: Standing   Other Standing Lumbar Exercises Pt was instructed in proper lifting technique of 5# box and when picking objects off the floor.      Lumbar Exercises: Supine   Clam 15 reps   Bent Knee Raise 15 reps   Bridge 15 reps;5 seconds   Bridge Limitations single leg bridge x 10 each LE lifted, bridge with feet on ball x 10 holding 5 seconds each     Lumbar Exercises: Quadruped   Plank 4 point plank on elbows with abdominal lag/ increased lordosis after three reps. pt needed tactile cues to correct. Holding each rep 5-10 seconds.      Knee/Hip Exercises: Stretches  Active Hamstring Stretch Both;2 reps;10 seconds  with strap verbal cues for stright knee                PT Education - 04/15/16 0933    Education Details sitting posture, lifting posture, review HEP   Person(s) Educated Patient   Methods Explanation;Demonstration;Handout   Comprehension Verbalized understanding;Returned demonstration          PT Short Term Goals - 04/15/16 0942      PT SHORT TERM GOAL #1   Title Pt will be independent in her home excercise program   Baseline issued 04-06-2016   Period Weeks   Status On-going     PT SHORT TERM GOAL #2   Title Pt will report pain of </= 3/10 with functional ADLs   Time 3   Period Weeks   Status On-going           PT Long Term Goals - 04/13/16 1404      PT LONG TERM GOAL  #1   Title Pt will be independent with HEP progression.   Time 6   Period Weeks   Status On-going     PT LONG TERM GOAL #2   Title Pt will improve FOTO score from 46 percent limitation to </= 36 percent limitation in order to improve functional mobility.   Time 6   Period Weeks   Status On-going     PT LONG TERM GOAL #3   Title Pt will improve right LE Hip flexion, abduction and extension to 5/5 in order to improve gait and functional transfers.   Time 6   Period Weeks   Status On-going     PT LONG TERM GOAL #4   Title Pt will be able to pick object up off the floor using correct body mechanics with no pain reported.   Time 6   Period Weeks   Status On-going               Plan - 04/15/16 0936    Clinical Impression Statement pt presents with decreased ROM, strength, and core stability following L4-5 laminectomy and discectomy on 03/03/16. pt reporting pain 3-4/10. Pt instructed in rest breaks while sitting and lifting techniques today along with ther exercises. skilled PT needed to progress pt toward her PLOF.    Rehab Potential Excellent   PT Frequency 2x / week   PT Duration 6 weeks   PT Treatment/Interventions ADLs/Self Care Home Management;Patient/family education;Taping;Dry needling;Cryotherapy;Electrical Stimulation;Iontophoresis 4mg /ml Dexamethasone;Moist Heat;Neuromuscular re-education;Therapeutic exercise;Therapeutic activities;Functional mobility training;Gait training;Stair training;Manual techniques;Passive range of motion   PT Next Visit Plan lumbar strengthening, ROM, core stability   PT Home Exercise Plan PPT, bridges, supine marching, single leg bridge,    Consulted and Agree with Plan of Care Patient      Patient will benefit from skilled therapeutic intervention in order to improve the following deficits and impairments:  Decreased activity tolerance, Decreased strength, Pain, Difficulty walking, Decreased balance, Decreased mobility, Postural  dysfunction, Impaired perceived functional ability, Decreased range of motion  Visit Diagnosis: Acute midline low back pain without sciatica  Muscle weakness (generalized)     Problem List Patient Active Problem List   Diagnosis Date Noted  . HNP (herniated nucleus pulposus), lumbar 03/03/2016  . At high risk for bleeding 03/19/2015  . 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 female breast ([place]) 03/17/2015    Oretha Caprice, MPT 04/15/2016, 12:12 PM  [facility] [address] [place],  [place], [address] Phone: [phone]   Fax:  [phone]  Name: Candace Campbell MRN: [id number] Date of Birth: Jan 19, 1956

## 2016-04-15 NOTE — Patient Instructions (Addendum)
SINGLE LEG BRIDGE - MODIFIED  While lying on your back, raise your buttocks off the floor/bed into a bridge position.    Next straighten a leg so that only one leg is supporting your body. Then, return that leg back to the ground and change to the other side.      Try and maintain your pelvis level the entire time.   10 times hold 5 seconds   EXERCISE BALL - FLOOR BRIDGE  While lying on the floor, place an exercise ball under your lower legs and then raise up your buttocks.  10 times each holding 5 seconds

## 2016-04-20 ENCOUNTER — Encounter: Payer: Self-pay | Admitting: Physical Therapy

## 2016-04-20 ENCOUNTER — Ambulatory Visit: Payer: BC Managed Care – PPO | Admitting: Physical Therapy

## 2016-04-20 DIAGNOSIS — M545 Low back pain, unspecified: Secondary | ICD-10-CM

## 2016-04-20 DIAGNOSIS — M6281 Muscle weakness (generalized): Secondary | ICD-10-CM

## 2016-04-20 NOTE — Therapy (Signed)
[facility] [address], [place], [place], [address] Phone: [phone]   Fax:  [phone]  Physical Therapy Treatment  Patient Details  Name: Candace Campbell MRN: [id number] Date of Birth: Jun 11, 1956 Referring Provider: Jovita Gamma, MD  Encounter Date: 04/20/2016      PT End of Session - 04/20/16 1105    Visit Number 4   Number of Visits 13   Date for PT Re-Evaluation 06/06/16   PT Start Time 1104   PT Stop Time 1200   PT Time Calculation (min) 56 min   Activity Tolerance Patient tolerated treatment well   Behavior During Therapy [facility] for tasks assessed/performed      Past Medical History:  Diagnosis Date  . Anxiety   . Blood dyscrasia    states daughter has Von Willebrand and she was tested 03-19-15 by Dr Lindi Adie  . Breast cancer ([place]) 03/13/15   right breast   . Depression   . GERD (gastroesophageal reflux disease)   . Headache    HX MIGRAINES  . History of kidney stones   . PONV (postoperative nausea and vomiting)     Past Surgical History:  Procedure Laterality Date  . ABDOMINAL HYSTERECTOMY    . BREAST LUMPECTOMY Left   . BREAST LUMPECTOMY WITH RADIOACTIVE SEED AND SENTINEL LYMPH NODE BIOPSY Right 03/26/2015   Procedure: BREAST LUMPECTOMY WITH RADIOACTIVE SEED AND SENTINEL LYMPH NODE BIOPSY;  Surgeon: [name], MD;  Location: [place];  Service: General;  Laterality: Right;  . BUNIONECTOMY     BILAT  . LUMBAR LAMINECTOMY/DECOMPRESSION MICRODISCECTOMY Right 03/03/2016   Procedure: RIGHT LUMBAR FOUR-FIVE LUMBAR LAMINECTOMYAND MICRODISCECTOMY;  Surgeon: Jovita Gamma, MD;  Location: [place] [facility];  Service: Neurosurgery;  Laterality: Right;  right    There were no vitals filed for this visit.      Subjective Assessment - 04/20/16 1106    Subjective Pt reports back has been doing well. Has been doing exercises and walking every day. Pt did fall while walking just prior to  coming to therapy and has some scrapes on Rt knee and face. Pt stated she just lost her balance while wlaking on a trail but feels ok.    Pertinent History breast CA, lumpectomy on R side s/p radiation   Limitations Sitting;House hold activities;Lifting   How long can you sit comfortably? 30 minutes   How long can you stand comfortably? unlimited   How long can you walk comfortably? unlimited   Diagnostic tests MRI prior to surgery   Patient Stated Goals Stop hurting, get stronger, build core strength, pt interested in yoga and pilates   Currently in Pain? Yes   Pain Score 2    Pain Location Back   Pain Orientation Right;Lower   Pain Descriptors / Indicators Aching   Pain Type Acute pain;Surgical pain   Pain Onset 1 to 4 weeks ago   Pain Frequency Constant                         [facility] Adult PT Treatment/Exercise - 04/20/16 0001      Lumbar Exercises: Standing   Other Standing Lumbar Exercises Funciton squats #5 box     Lumbar Exercises: Supine   Ab Set 20 reps  with red ball   Clam 15 reps   Bridge 15 reps;5 seconds   Bridge Limitations --     Lumbar Exercises: Quadruped   Plank 4 point plank on  elbows 4 times 10 second holds  verbal cues for hip position     Knee/Hip Exercises: Machines for Strengthening   Cybex Knee Extension #15 3x10     Knee/Hip Exercises: Standing   Hip Flexion Stengthening;Both;2 sets;10 reps   Hip Abduction Stengthening;Both;2 sets;10 reps   Hip Extension Stengthening;Both;2 sets;10 reps     Modalities   Modalities Electrical Stimulation;Moist Heat;Cryotherapy     Cryotherapy   Number Minutes Cryotherapy 15 Minutes   Cryotherapy Location Hip  Rt   Type of Cryotherapy Ice pack     Electrical Stimulation   Electrical Stimulation Location Rt SI joint   Electrical Stimulation Action IFC   Electrical Stimulation Parameters To tolerance   Electrical Stimulation Goals Pain     Manual Therapy   Manual Therapy Soft tissue  mobilization   Manual therapy comments Pt Lt sidelying, Rt knee bent   Soft tissue mobilization Rt glutes, hamstring insertion, piriformis  IASTM                  PT Short Term Goals - 04/20/16 1109      PT SHORT TERM GOAL #1   Title Pt will be independent in her home excercise program   Baseline issued 04-06-2016   Time 3   Period Weeks   Status Achieved     PT SHORT TERM GOAL #2   Title Pt will report pain of </= 3/10 with functional ADLs   Time 3   Period Weeks   Status Partially Met  only has increased pain with needle felting; other ADLs pain free           PT Long Term Goals - 04/20/16 1110      PT LONG TERM GOAL #1   Title Pt will be independent with HEP progression.   Time 6   Period Weeks   Status On-going     PT LONG TERM GOAL #2   Title Pt will improve FOTO score from 46 percent limitation to </= 36 percent limitation in order to improve functional mobility.   Time 6   Period Weeks   Status On-going     PT LONG TERM GOAL #3   Title Pt will improve right LE Hip flexion, abduction and extension to 5/5 in order to improve gait and functional transfers.   Time 6   Period Weeks   Status On-going     PT LONG TERM GOAL #4   Title Pt will be able to pick object up off the floor using correct body mechanics with no pain reported.   Time 6   Period Weeks   Status On-going               Plan - 04/20/16 1159    Clinical Impression Statement Pt reports minimal pain in back with daily activities. Has increased pain while felting due to leaning and sitting. Pt able to tolerate all exercises well. Therapist giving verbal cues for abdominal contractions and during exericses. Pt reported increase pain behind Rt hip. Tightness and tenderness decreased with manual techniques and modalities.  Pt will continue to benefit from skilled therapy for progression of core strength and stability.    Rehab Potential Excellent   PT Frequency 2x / week   PT  Duration 6 weeks   PT Treatment/Interventions ADLs/Self Care Home Management;Patient/family education;Taping;Dry needling;Cryotherapy;Electrical Stimulation;Iontophoresis 75m/ml Dexamethasone;Moist Heat;Neuromuscular re-education;Therapeutic exercise;Therapeutic activities;Functional mobility training;Gait training;Stair training;Manual techniques;Passive range of motion   PT Next Visit Plan lumbar strengthening, ROM, core  stability   Consulted and Agree with Plan of Care Patient      Patient will benefit from skilled therapeutic intervention in order to improve the following deficits and impairments:  Decreased activity tolerance, Decreased strength, Pain, Difficulty walking, Decreased balance, Decreased mobility, Postural dysfunction, Impaired perceived functional ability, Decreased range of motion  Visit Diagnosis: Acute midline low back pain without sciatica  Muscle weakness (generalized)     Problem List Patient Active Problem List   Diagnosis Date Noted  . HNP (herniated nucleus pulposus), lumbar 03/03/2016  . At high risk for bleeding 03/19/2015  . 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 female breast ([place]) 03/17/2015    Mikle Bosworth PTA 04/20/2016, 12:16 PM  [facility] [address] [place], [place], [address] Phone: [phone]   Fax:  [phone]  Name: TOCCARA ALFORD MRN: [id number] Date of Birth: [birth year]

## 2016-04-22 ENCOUNTER — Ambulatory Visit: Payer: BC Managed Care – PPO | Admitting: Physical Therapy

## 2016-04-22 DIAGNOSIS — M545 Low back pain, unspecified: Secondary | ICD-10-CM

## 2016-04-22 DIAGNOSIS — M6281 Muscle weakness (generalized): Secondary | ICD-10-CM

## 2016-04-22 NOTE — Patient Instructions (Signed)
Bracing With Arm Raise (Quadruped)  On hands and knees find neutral spine. Tighten pelvic floor and abdominals and hold. Alternately lift arm to shoulder level. Repeat _10__ times. Do _1__ times a day.  Quadruped Alternate Hip Extension   Shift weight to one side and raise opposite leg. Keep trunk steady. ___ reps per set, ___ sets per day, ___ days per week Repeat with other leg.  Bracing With Arm / Leg Raise (Quadruped)  On hands and knees find neutral spine. Tighten pelvic floor and abdominals and hold. Alternating, lift arm to shoulder level and opposite leg to hip level. Repeat 10___ times. Do 1___ times a day.  Abduction: Clam (Eccentric) - Side-Lying   Lie on side with knees bent. Lift top knee, keeping feet together. Keep trunk steady. Slowly lower for 3-5 seconds. _10__ reps per set, _1__ sets per day, __7_ days per week. Add red band around thighs  Copyright  VHI. All rights reserved.  [name] PT [facility] [address] [place], [place] [address] Phone # [phone] Fax [phone]

## 2016-04-22 NOTE — Therapy (Signed)
[facility] [address] [place], [place], [address] Phone: [phone]   Fax:  [phone]  Physical Therapy Treatment  Patient Details  Name: Candace Campbell MRN: [id number] Date of Birth: 02-Jun-1956 Referring Provider: Jovita Gamma, MD  Encounter Date: 04/22/2016    Past Medical History:  Diagnosis Date  . Anxiety   . Blood dyscrasia    states daughter has Von Willebrand and she was tested 03-19-15 by Dr Lindi Adie  . Breast cancer ([place]) 03/13/15   right breast   . Depression   . GERD (gastroesophageal reflux disease)   . Headache    HX MIGRAINES  . History of kidney stones   . PONV (postoperative nausea and vomiting)     Past Surgical History:  Procedure Laterality Date  . ABDOMINAL HYSTERECTOMY    . BREAST LUMPECTOMY Left   . BREAST LUMPECTOMY WITH RADIOACTIVE SEED AND SENTINEL LYMPH NODE BIOPSY Right 03/26/2015   Procedure: BREAST LUMPECTOMY WITH RADIOACTIVE SEED AND SENTINEL LYMPH NODE BIOPSY;  Surgeon: [name], MD;  Location: [place];  Service: General;  Laterality: Right;  . BUNIONECTOMY     BILAT  . LUMBAR LAMINECTOMY/DECOMPRESSION MICRODISCECTOMY Right 03/03/2016   Procedure: RIGHT LUMBAR FOUR-FIVE LUMBAR LAMINECTOMYAND MICRODISCECTOMY;  Surgeon: Jovita Gamma, MD;  Location: [place] [facility];  Service: Neurosurgery;  Laterality: Right;  right    There were no vitals filed for this visit.                                 PT Short Term Goals - 04/20/16 1109      PT SHORT TERM GOAL #1   Title Pt will be independent in her home excercise program   Baseline issued 04-06-2016   Time 3   Period Weeks   Status Achieved     PT SHORT TERM GOAL #2   Title Pt will report pain of </= 3/10 with functional ADLs   Time 3   Period Weeks   Status Partially Met  only has increased pain with needle felting; other ADLs pain free           PT Long Term Goals -  04/20/16 1110      PT LONG TERM GOAL #1   Title Pt will be independent with HEP progression.   Time 6   Period Weeks   Status On-going     PT LONG TERM GOAL #2   Title Pt will improve FOTO score from 46 percent limitation to </= 36 percent limitation in order to improve functional mobility.   Time 6   Period Weeks   Status On-going     PT LONG TERM GOAL #3   Title Pt will improve right LE Hip flexion, abduction and extension to 5/5 in order to improve gait and functional transfers.   Time 6   Period Weeks   Status On-going     PT LONG TERM GOAL #4   Title Pt will be able to pick object up off the floor using correct body mechanics with no pain reported.   Time 6   Period Weeks   Status On-going             Patient will benefit from skilled therapeutic intervention in order to improve the following deficits and impairments:     Visit Diagnosis: No diagnosis found.     Problem List Patient Active Problem List  Diagnosis Date Noted  . HNP (herniated nucleus pulposus), lumbar 03/03/2016  . At high risk for bleeding 03/19/2015  . 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 female breast ([place]) 03/17/2015    [name] 04/22/2016, 8:48 AM   [facility] [address] [place], [place], [address] Phone: [phone]   Fax:  [phone]  Name: Candace Campbell MRN: [id number] Date of Birth: [birth year]

## 2016-04-22 NOTE — Therapy (Signed)
[facility] [address] [place], [place], [address] Phone: [phone]   Fax:  [phone]  Physical Therapy Treatment  Patient Details  Name: Candace Campbell MRN: [id number] Date of Birth: 23-Apr-1956 Referring Provider: Jovita Gamma, MD  Encounter Date: 04/22/2016      PT End of Session - 04/22/16 0938    Visit Number 5   Number of Visits 13   Date for PT Re-Evaluation 06/06/16   PT Start Time 7544   PT Stop Time 0945   PT Time Calculation (min) 58 min   Activity Tolerance Patient tolerated treatment well      Past Medical History:  Diagnosis Date  . Anxiety   . Blood dyscrasia    states daughter has Von Willebrand and she was tested 03-19-15 by Dr Lindi Adie  . Breast cancer ([place]) 03/13/15   right breast   . Depression   . GERD (gastroesophageal reflux disease)   . Headache    HX MIGRAINES  . History of kidney stones   . PONV (postoperative nausea and vomiting)     Past Surgical History:  Procedure Laterality Date  . ABDOMINAL HYSTERECTOMY    . BREAST LUMPECTOMY Left   . BREAST LUMPECTOMY WITH RADIOACTIVE SEED AND SENTINEL LYMPH NODE BIOPSY Right 03/26/2015   Procedure: BREAST LUMPECTOMY WITH RADIOACTIVE SEED AND SENTINEL LYMPH NODE BIOPSY;  Surgeon: [name], MD;  Location: [place];  Service: General;  Laterality: Right;  . BUNIONECTOMY     BILAT  . LUMBAR LAMINECTOMY/DECOMPRESSION MICRODISCECTOMY Right 03/03/2016   Procedure: RIGHT LUMBAR FOUR-FIVE LUMBAR LAMINECTOMYAND MICRODISCECTOMY;  Surgeon: Jovita Gamma, MD;  Location: [place] [facility];  Service: Neurosurgery;  Laterality: Right;  right    There were no vitals filed for this visit.      Subjective Assessment - 04/22/16 0855    Subjective 4/10 right low back pain.  Feels better with core ex's, soft tissue work and e-stim/heat.  Reports continued foot numbness on right which may have contributed to fall last week.     Currently in Pain? Yes   Pain Score 4    Pain Location Back   Pain Orientation Right;Lower   Pain Type Surgical pain   Pain Onset More than a month ago   Pain Frequency Constant                         [facility] Adult PT Treatment/Exercise - 04/22/16 0001      Lumbar Exercises: Stretches   Active Hamstring Stretch 5 reps   Active Hamstring Stretch Limitations supine nerve gliding with hands assist right and left with ankle DF     Lumbar Exercises: Standing   Other Standing Lumbar Exercises standing rocker board 3 min      Lumbar Exercises: Prone   Other Prone Lumbar Exercises partial press ups 10x   Other Prone Lumbar Exercises lumbar multifidi series:  5x each press, HS curls, hip extension small range     Lumbar Exercises: Quadruped   Single Arm Raise Right;Left;5 reps   Straight Leg Raise 5 reps   Opposite Arm/Leg Raise Right arm/Left leg;Left arm/Right leg;5 reps     Knee/Hip Exercises: Sidelying   Clams red band 10x right/left     Moist Heat Therapy   Number Minutes Moist Heat 15 Minutes   Moist Heat Location Lumbar Spine     Electrical Stimulation   Electrical Stimulation Location Rt SI joint   Electrical  Stimulation Action IFC   Electrical Stimulation Parameters 14 ma 15 in supine   Electrical Stimulation Goals Pain     Manual Therapy   Soft tissue mobilization right QL, gluteals and proximal HS                PT Education - 04/22/16 0937    Education provided Yes   Education Details quadruped bird dogs, clams with red band   Person(s) Educated Patient   Methods Explanation;Demonstration;Handout   Comprehension Verbalized understanding;Returned demonstration          PT Short Term Goals - 04/22/16 0951      PT SHORT TERM GOAL #1   Title Pt will be independent in her home excercise program   Status Achieved     PT SHORT TERM GOAL #2   Title Pt will report pain of </= 3/10 with functional ADLs   Time 3   Period Weeks   Status  Partially Met           PT Long Term Goals - 04/22/16 3794      PT LONG TERM GOAL #1   Title Pt will be independent with HEP progression.   Time 6   Period Weeks   Status On-going     PT LONG TERM GOAL #2   Title Pt will improve FOTO score from 46 percent limitation to </= 36 percent limitation in order to improve functional mobility.   Time 6   Period Weeks   Status On-going     PT LONG TERM GOAL #3   Title Pt will improve right LE Hip flexion, abduction and extension to 5/5 in order to improve gait and functional transfers.   Time 6   Period Weeks   Status On-going     PT LONG TERM GOAL #4   Title Pt will be able to pick object up off the floor using correct body mechanics with no pain reported.   Time 6   Period Weeks   Status On-going               Plan - 04/22/16 3276    Clinical Impression Statement  The patient is able to activate transverse abdominals and lumbar multifidi with min verbal and tactile cues.  No increase in pain with moderate intensity exercise.  No falls this week but patient feels her right ankle weakness may have contibuted.  Patient reports good pain relief with manual therapy and e-stim/heat.  Therapist closely monitoring response with all interventions.     PT Next Visit Plan core strengthening;  right ankle strengthening especially dorsiflexors/evertors;  cardio for endurance;  soft tissue work; e-stim/heat as needed      Patient will benefit from skilled therapeutic intervention in order to improve the following deficits and impairments:     Visit Diagnosis: Acute midline low back pain without sciatica  Muscle weakness (generalized)     Problem List Patient Active Problem List   Diagnosis Date Noted  . HNP (herniated nucleus pulposus), lumbar 03/03/2016  . At high risk for bleeding 03/19/2015  . 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 female breast ([place]) 03/17/2015   [name], PT 04/22/16 9:55 AM Phone:  [phone] Fax: [phone]  [name] 04/22/2016, 9:55 AM  [facility] [address], [place], [place], [address] Phone: [phone]   Fax:  [phone]  Name: SHEYLIN SCHARNHORST MRN: [id number] Date of Birth: 01-15-1956

## 2016-04-22 NOTE — Therapy (Signed)
[facility] [address], [place], [place], [address] Phone: [phone]   Fax:  [phone]  Physical Therapy Treatment  Patient Details  Name: Candace Campbell MRN: [id number] Date of Birth: 17-Aug-1955 Referring Provider: Jovita Gamma, MD  Encounter Date: 04/22/2016      PT End of Session - 04/22/16 0938    Visit Number 5   Number of Visits 13   Date for PT Re-Evaluation 06/06/16   PT Start Time 2800   PT Stop Time 0945   PT Time Calculation (min) 58 min   Activity Tolerance Patient tolerated treatment well      Past Medical History:  Diagnosis Date  . Anxiety   . Blood dyscrasia    states daughter has Von Willebrand and she was tested 03-19-15 by Dr Lindi Adie  . Breast cancer ([place]) 03/13/15   right breast   . Depression   . GERD (gastroesophageal reflux disease)   . Headache    HX MIGRAINES  . History of kidney stones   . PONV (postoperative nausea and vomiting)     Past Surgical History:  Procedure Laterality Date  . ABDOMINAL HYSTERECTOMY    . BREAST LUMPECTOMY Left   . BREAST LUMPECTOMY WITH RADIOACTIVE SEED AND SENTINEL LYMPH NODE BIOPSY Right 03/26/2015   Procedure: BREAST LUMPECTOMY WITH RADIOACTIVE SEED AND SENTINEL LYMPH NODE BIOPSY;  Surgeon: [name], MD;  Location: [place];  Service: General;  Laterality: Right;  . BUNIONECTOMY     BILAT  . LUMBAR LAMINECTOMY/DECOMPRESSION MICRODISCECTOMY Right 03/03/2016   Procedure: RIGHT LUMBAR FOUR-FIVE LUMBAR LAMINECTOMYAND MICRODISCECTOMY;  Surgeon: Jovita Gamma, MD;  Location: [facility];  Service: Neurosurgery;  Laterality: Right;  right    There were no vitals filed for this visit.      Subjective Assessment - 04/22/16 0855    Subjective 4/10 right low back pain.  Feels better with core ex's, soft tissue work and e-stim/heat.  Reports continued foot numbness on right which may have contributed to fall last week.      Currently in Pain? Yes   Pain Score 4    Pain Location Back   Pain Orientation Right;Lower   Pain Type Surgical pain   Pain Onset More than a month ago   Pain Frequency Constant                         [facility] Adult PT Treatment/Exercise - 04/22/16 0001      Lumbar Exercises: Stretches   Active Hamstring Stretch 5 reps   Active Hamstring Stretch Limitations supine nerve gliding with hands assist right and left with ankle DF     Lumbar Exercises: Standing   Other Standing Lumbar Exercises standing rocker board 3 min      Lumbar Exercises: Prone   Other Prone Lumbar Exercises partial press ups 10x   Other Prone Lumbar Exercises lumbar multifidi series:  5x each press, HS curls, hip extension small range     Lumbar Exercises: Quadruped   Single Arm Raise Right;Left;5 reps   Straight Leg Raise 5 reps   Opposite Arm/Leg Raise Right arm/Left leg;Left arm/Right leg;5 reps     Knee/Hip Exercises: Sidelying   Clams red band 10x right/left     Moist Heat Therapy   Number Minutes Moist Heat 15 Minutes   Moist Heat Location Lumbar Spine     Electrical Stimulation   Electrical Stimulation Location Rt SI joint  Electrical Stimulation Action IFC   Electrical Stimulation Parameters 14 ma 15 in supine   Electrical Stimulation Goals Pain     Manual Therapy   Soft tissue mobilization right QL, gluteals and proximal HS                PT Education - 04/22/16 [phone]    Education provided Yes   Education Details quadruped bird dogs, clams with red band   Person(s) Educated Patient   Methods Explanation;Demonstration;Handout   Comprehension Verbalized understanding;Returned demonstration          PT Short Term Goals - 04/22/16 0951      PT SHORT TERM GOAL #1   Title Pt will be independent in her home excercise program   Status Achieved     PT SHORT TERM GOAL #2   Title Pt will report pain of </= 3/10 with functional ADLs   Status Achieved            PT Long Term Goals - 04/22/16 8264      PT LONG TERM GOAL #1   Title Pt will be independent with HEP progression.   Time 6   Period Weeks   Status On-going     PT LONG TERM GOAL #2   Title Pt will improve FOTO score from 46 percent limitation to </= 36 percent limitation in order to improve functional mobility.   Time 6   Period Weeks   Status On-going     PT LONG TERM GOAL #3   Title Pt will improve right LE Hip flexion, abduction and extension to 5/5 in order to improve gait and functional transfers.   Time 6   Period Weeks   Status On-going     PT LONG TERM GOAL #4   Title Pt will be able to pick object up off the floor using correct body mechanics with no pain reported.   Time 6   Period Weeks   Status On-going               Plan - 04/22/16 1583    Clinical Impression Statement Progressing well with short term goals met.  The patient is able to activate transverse abdominals and lumbar multifidi with min verbal and tactile cues.  No increase in pain with moderate intensity exercise.  No falls this week but patient feels her right ankle weakness may have contibuted.  Patient reports good pain relief with manual therapy and e-stim/heat.  Therapist closely monitoring response with all interventions.     PT Next Visit Plan core strengthening;  right ankle strengthening especially dorsiflexors/evertors;  cardio for endurance;  soft tissue work; e-stim/heat as needed      Patient will benefit from skilled therapeutic intervention in order to improve the following deficits and impairments:     Visit Diagnosis: Acute midline low back pain without sciatica  Muscle weakness (generalized)     Problem List Patient Active Problem List   Diagnosis Date Noted  . HNP (herniated nucleus pulposus), lumbar 03/03/2016  . At high risk for bleeding 03/19/2015  . 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 female breast ([place]) 03/17/2015    [name] 04/22/2016,  9:54 AM  [facility] [address] [place], [place], [address] Phone: [phone]   Fax:  [phone]  Name: Candace Campbell MRN: [id number] Date of Birth: Jul 04, 1956

## 2016-04-27 ENCOUNTER — Ambulatory Visit: Payer: BC Managed Care – PPO | Admitting: Physical Therapy

## 2016-04-27 ENCOUNTER — Encounter: Payer: Self-pay | Admitting: Physical Therapy

## 2016-04-27 DIAGNOSIS — M545 Low back pain, unspecified: Secondary | ICD-10-CM

## 2016-04-27 DIAGNOSIS — C50511 Malignant neoplasm of lower-outer quadrant of right female breast: Secondary | ICD-10-CM

## 2016-04-27 DIAGNOSIS — R490 Dysphonia: Secondary | ICD-10-CM

## 2016-04-27 DIAGNOSIS — M6281 Muscle weakness (generalized): Secondary | ICD-10-CM

## 2016-04-27 NOTE — Therapy (Signed)
[facility] [address], [place], [place], [address] Phone: [phone]   Fax:  [phone]  Physical Therapy Treatment  Patient Details  Name: QUEENIE AUFIERO MRN: [id number] Date of Birth: 1955-12-29 Referring Provider: Jovita Gamma, MD  Encounter Date: 04/27/2016      PT End of Session - 04/27/16 1537    Visit Number 6   Number of Visits 13   Date for PT Re-Evaluation 06/06/16   PT Start Time 2025   PT Stop Time 1625   PT Time Calculation (min) 54 min   Activity Tolerance Patient tolerated treatment well   Behavior During Therapy [facility] for tasks assessed/performed      Past Medical History:  Diagnosis Date  . Anxiety   . Blood dyscrasia    states daughter has Von Willebrand and she was tested 03-19-15 by Dr Lindi Adie  . Breast cancer ([place]) 03/13/15   right breast   . Depression   . GERD (gastroesophageal reflux disease)   . Headache    HX MIGRAINES  . History of kidney stones   . PONV (postoperative nausea and vomiting)     Past Surgical History:  Procedure Laterality Date  . ABDOMINAL HYSTERECTOMY    . BREAST LUMPECTOMY Left   . BREAST LUMPECTOMY WITH RADIOACTIVE SEED AND SENTINEL LYMPH NODE BIOPSY Right 03/26/2015   Procedure: BREAST LUMPECTOMY WITH RADIOACTIVE SEED AND SENTINEL LYMPH NODE BIOPSY;  Surgeon: [name], MD;  Location: [place];  Service: General;  Laterality: Right;  . BUNIONECTOMY     BILAT  . LUMBAR LAMINECTOMY/DECOMPRESSION MICRODISCECTOMY Right 03/03/2016   Procedure: RIGHT LUMBAR FOUR-FIVE LUMBAR LAMINECTOMYAND MICRODISCECTOMY;  Surgeon: Jovita Gamma, MD;  Location: [place] [facility];  Service: Neurosurgery;  Laterality: Right;  right    There were no vitals filed for this visit.      Subjective Assessment - 04/27/16 1535    Subjective Pain mostly in Bil hips. Pt got new shoes from [employer] and feels much more stable.    Pertinent History breast CA, lumpectomy  on R side s/p radiation   Limitations Sitting;House hold activities;Lifting   How long can you sit comfortably? 30 minutes   How long can you stand comfortably? unlimited   How long can you walk comfortably? unlimited   Diagnostic tests MRI prior to surgery   Patient Stated Goals Stop hurting, get stronger, build core strength, pt interested in yoga and pilates   Currently in Pain? Yes   Pain Score 3    Pain Location Back   Pain Orientation Right;Left   Pain Descriptors / Indicators Aching   Pain Type Surgical pain   Pain Onset More than a month ago   Pain Frequency Constant                         [facility] Adult PT Treatment/Exercise - 04/27/16 0001      Lumbar Exercises: Supine   Ab Set 20 reps  with red ball   Bridge 15 reps;5 seconds  With yellow band     Lumbar Exercises: Prone   Other Prone Lumbar Exercises partial press ups 10x   Other Prone Lumbar Exercises lumbar multifidi series:  5x each press, HS curls, hip extension small range     Lumbar Exercises: Quadruped   Single Arm Raise Right;Left;5 reps   Opposite Arm/Leg Raise Right arm/Left leg;Left arm/Right leg;5 reps   Plank 4 point plank on elbows 4 times  10 second holds  verbal cues for hip position     Knee/Hip Exercises: Standing   Hip Flexion Stengthening;Both;2 sets;10 reps   Hip Abduction Stengthening;Both;2 sets;10 reps   Hip Extension Stengthening;Both;2 sets;10 reps     Moist Heat Therapy   Number Minutes Moist Heat 15 Minutes   Moist Heat Location Lumbar Spine     Electrical Stimulation   Electrical Stimulation Location Rt SI joint   Electrical Stimulation Action IFC   Electrical Stimulation Parameters To tolerance   Electrical Stimulation Goals Pain                PT Education - 04/27/16 1615    Education provided No          PT Short Term Goals - 04/27/16 1633      PT SHORT TERM GOAL #1   Title Pt will be independent in her home excercise program   Baseline  issued 04-06-2016   Time 3   Period Weeks   Status Achieved     PT SHORT TERM GOAL #2   Title Pt will report pain of </= 3/10 with functional ADLs   Time 3   Period Weeks   Status Partially Met           PT Long Term Goals - 04/27/16 1634      PT LONG TERM GOAL #1   Title Pt will be independent with HEP progression.   Time 6   Period Weeks   Status On-going     PT LONG TERM GOAL #2   Title Pt will improve FOTO score from 46 percent limitation to </= 36 percent limitation in order to improve functional mobility.   Time 6   Period Weeks   Status On-going     PT LONG TERM GOAL #3   Title Pt will improve right LE Hip flexion, abduction and extension to 5/5 in order to improve gait and functional transfers.   Time 6   Period Weeks   Status On-going     PT LONG TERM GOAL #4   Title Pt will be able to pick object up off the floor using correct body mechanics with no pain reported.   Time 6   Period Weeks   Status On-going               Plan - 04/27/16 1616    Clinical Impression Statement Pt able to perform and complete all exericses well with minimal verbal cues. Pt continues to progress with hip stability and balance. Pt will continue to benefit fomr skilled therapy for core and Bil hip strength to reduce pain and inprove balance.    Rehab Potential Excellent   PT Frequency 2x / week   PT Duration 6 weeks   PT Treatment/Interventions ADLs/Self Care Home Management;Patient/family education;Taping;Dry needling;Cryotherapy;Electrical Stimulation;Iontophoresis 41m/ml Dexamethasone;Moist Heat;Neuromuscular re-education;Therapeutic exercise;Therapeutic activities;Functional mobility training;Gait training;Stair training;Manual techniques;Passive range of motion   PT Next Visit Plan core strengthening;  right ankle strengthening especially dorsiflexors/evertors;  cardio for endurance;  soft tissue work; e-stim/heat as needed   Consulted and Agree with Plan of Care Patient       Patient will benefit from skilled therapeutic intervention in order to improve the following deficits and impairments:  Decreased activity tolerance, Decreased strength, Pain, Difficulty walking, Decreased balance, Decreased mobility, Postural dysfunction, Impaired perceived functional ability, Decreased range of motion  Visit Diagnosis: Acute midline low back pain without sciatica  Muscle weakness (generalized)  Carcinoma of lower-outer quadrant of right  female breast, unspecified estrogen receptor status ([place])  Dysphonia     Problem List Patient Active Problem List   Diagnosis Date Noted  . HNP (herniated nucleus pulposus), lumbar 03/03/2016  . At high risk for bleeding 03/19/2015  . 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 female breast ([place]) 03/17/2015    Mikle Bosworth PTA 04/27/2016, 4:38 PM  [facility] [address] [place], [place], [address] Phone: [phone]   Fax:  [phone]  Name: INFINITI HOEFLING MRN: [id number] Date of Birth: [birth year]

## 2016-04-29 ENCOUNTER — Ambulatory Visit: Payer: BC Managed Care – PPO | Admitting: Physical Therapy

## 2016-04-29 ENCOUNTER — Encounter: Payer: Self-pay | Admitting: Physical Therapy

## 2016-04-29 DIAGNOSIS — M545 Low back pain, unspecified: Secondary | ICD-10-CM

## 2016-04-29 DIAGNOSIS — M6281 Muscle weakness (generalized): Secondary | ICD-10-CM

## 2016-04-29 DIAGNOSIS — R490 Dysphonia: Secondary | ICD-10-CM

## 2016-04-29 DIAGNOSIS — C50511 Malignant neoplasm of lower-outer quadrant of right female breast: Secondary | ICD-10-CM

## 2016-04-29 NOTE — Therapy (Signed)
[facility] [address] [place], [place], [address] Phone: [phone]   Fax:  [phone]  Physical Therapy Treatment  Patient Details  Name: Candace Campbell MRN: [id number] Date of Birth: 12/23/1955 Referring Provider: Jovita Gamma, MD  Encounter Date: 04/29/2016      PT End of Session - 04/29/16 1113    Visit Number 7   Number of Visits 13   Date for PT Re-Evaluation 06/06/16   PT Start Time 1107   PT Stop Time 1204   PT Time Calculation (min) 57 min   Activity Tolerance Patient tolerated treatment well   Behavior During Therapy [facility] for tasks assessed/performed      Past Medical History:  Diagnosis Date  . Anxiety   . Blood dyscrasia    states daughter has Von Willebrand and she was tested 03-19-15 by Dr Lindi Adie  . Breast cancer ([place]) 03/13/15   right breast   . Depression   . GERD (gastroesophageal reflux disease)   . Headache    HX MIGRAINES  . History of kidney stones   . PONV (postoperative nausea and vomiting)     Past Surgical History:  Procedure Laterality Date  . ABDOMINAL HYSTERECTOMY    . BREAST LUMPECTOMY Left   . BREAST LUMPECTOMY WITH RADIOACTIVE SEED AND SENTINEL LYMPH NODE BIOPSY Right 03/26/2015   Procedure: BREAST LUMPECTOMY WITH RADIOACTIVE SEED AND SENTINEL LYMPH NODE BIOPSY;  Surgeon: [name], MD;  Location: [place];  Service: General;  Laterality: Right;  . BUNIONECTOMY     BILAT  . LUMBAR LAMINECTOMY/DECOMPRESSION MICRODISCECTOMY Right 03/03/2016   Procedure: RIGHT LUMBAR FOUR-FIVE LUMBAR LAMINECTOMYAND MICRODISCECTOMY;  Surgeon: Jovita Gamma, MD;  Location: [place] [facility];  Service: Neurosurgery;  Laterality: Right;  right    There were no vitals filed for this visit.      Subjective Assessment - 04/29/16 1108    Subjective Pt reports having some stiffness across low back. Does feel more stable and balanced with walking but continues to have  difficulty with body mechanics. Also continues to struggle with single leg stance exercise put equal weight through foot.    Pertinent History breast CA, lumpectomy on R side s/p radiation   Limitations Sitting;House hold activities;Lifting   Currently in Pain? Yes   Pain Score 5    Pain Location Back   Pain Orientation Right;Left   Pain Descriptors / Indicators Aching   Pain Onset More than a month ago                         [facility] Adult PT Treatment/Exercise - 04/29/16 0001      Lumbar Exercises: Machines for Strengthening   Leg Press Seat 6 #65     Lumbar Exercises: Standing   Row Both;20 reps;Theraband   Theraband Level (Row) Level 2 (Red)   Shoulder Extension Both;20 reps;Theraband   Theraband Level (Shoulder Extension) Level 2 (Red)   Other Standing Lumbar Exercises standing rocker board 3 min      Lumbar Exercises: Prone   Other Prone Lumbar Exercises lumbar multifidi series:  5x each press, HS curls, hip extension small range     Lumbar Exercises: Quadruped   Single Arm Raise Right;Left;5 reps   Opposite Arm/Leg Raise Right arm/Left leg;Left arm/Right leg;5 reps     Knee/Hip Exercises: Standing   Hip Flexion Stengthening;Both;2 sets;10 reps   Hip Abduction Stengthening;Both;2 sets;10 reps   Hip Extension Stengthening;Both;2 sets;10 reps  Moist Heat Therapy   Number Minutes Moist Heat 15 Minutes   Moist Heat Location Lumbar Spine     Electrical Stimulation   Electrical Stimulation Location Rt SI joint   Electrical Stimulation Action IFC   Electrical Stimulation Parameters To tolerance   Electrical Stimulation Goals Pain                  PT Short Term Goals - 04/27/16 1633      PT SHORT TERM GOAL #1   Title Pt will be independent in her home excercise program   Baseline issued 04-06-2016   Time 3   Period Weeks   Status Achieved     PT SHORT TERM GOAL #2   Title Pt will report pain of </= 3/10 with functional ADLs   Time 3    Period Weeks   Status Partially Met           PT Long Term Goals - 04/27/16 1634      PT LONG TERM GOAL #1   Title Pt will be independent with HEP progression.   Time 6   Period Weeks   Status On-going     PT LONG TERM GOAL #2   Title Pt will improve FOTO score from 46 percent limitation to </= 36 percent limitation in order to improve functional mobility.   Time 6   Period Weeks   Status On-going     PT LONG TERM GOAL #3   Title Pt will improve right LE Hip flexion, abduction and extension to 5/5 in order to improve gait and functional transfers.   Time 6   Period Weeks   Status On-going     PT LONG TERM GOAL #4   Title Pt will be able to pick object up off the floor using correct body mechanics with no pain reported.   Time 6   Period Weeks   Status On-going               Plan - 04/29/16 1159    Clinical Impression Statement Pt reports some stiffness across back today. Mostly just feeling weak. Able to complete all exercises with no increase in pain. Pt able to do Leg press today for LE strenghtening to progress with ability to squat for proper body mechanics. Pt will continue to benefit from skilled therapy for LE and core strengthening for balance and stability.    Rehab Potential Excellent   PT Frequency 2x / week   PT Duration 6 weeks   PT Treatment/Interventions ADLs/Self Care Home Management;Patient/family education;Taping;Dry needling;Cryotherapy;Electrical Stimulation;Iontophoresis 72m/ml Dexamethasone;Moist Heat;Neuromuscular re-education;Therapeutic exercise;Therapeutic activities;Functional mobility training;Gait training;Stair training;Manual techniques;Passive range of motion   PT Next Visit Plan Practice body mechanics, core strengthening;  right ankle strengthening especially dorsiflexors/evertors;  cardio for endurance;  soft tissue work; e-stim/heat as needed   Consulted and Agree with Plan of Care Patient      Patient will benefit from  skilled therapeutic intervention in order to improve the following deficits and impairments:  Decreased activity tolerance, Decreased strength, Pain, Difficulty walking, Decreased balance, Decreased mobility, Postural dysfunction, Impaired perceived functional ability, Decreased range of motion  Visit Diagnosis: Acute midline low back pain without sciatica  Carcinoma of lower-outer quadrant of right female breast, unspecified estrogen receptor status ([facility])  Muscle weakness (generalized)  Dysphonia     Problem List Patient Active Problem List   Diagnosis Date Noted  . HNP (herniated nucleus pulposus), lumbar 03/03/2016  . At high risk for bleeding 03/19/2015  .  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 female breast ([place]) 03/17/2015    Mikle Bosworth PTA 04/29/2016, 12:25 PM  [facility] [address] [place], [place], [address] Phone: [phone]   Fax:  [phone]  Name: IVOREE FELMLEE MRN: [id number] Date of Birth: 1955/08/21

## 2016-05-04 ENCOUNTER — Ambulatory Visit: Payer: BC Managed Care – PPO | Admitting: Physical Therapy

## 2016-05-04 ENCOUNTER — Encounter: Payer: Self-pay | Admitting: Physical Therapy

## 2016-05-04 DIAGNOSIS — M545 Low back pain, unspecified: Secondary | ICD-10-CM

## 2016-05-04 NOTE — Therapy (Signed)
[facility] [address], [place], [place], [address] Phone: [phone]   Fax:  [phone]  Physical Therapy Treatment  Patient Details  Name: Candace Campbell MRN: [id number] Date of Birth: [birth year] Referring Provider: Jovita Gamma, MD  Encounter Date: 05/04/2016      PT End of Session - 05/04/16 0935    Visit Number 8   Number of Visits 13   Date for PT Re-Evaluation 06/06/16   PT Start Time 0930   PT Stop Time 1034   PT Time Calculation (min) 64 min   Activity Tolerance Patient tolerated treatment well   Behavior During Therapy [facility] for tasks assessed/performed      Past Medical History:  Diagnosis Date  . Anxiety   . Blood dyscrasia    states daughter has Von Willebrand and she was tested 03-19-15 by Dr Lindi Adie  . Breast cancer ([place]) 03/13/15   right breast   . Depression   . GERD (gastroesophageal reflux disease)   . Headache    HX MIGRAINES  . History of kidney stones   . PONV (postoperative nausea and vomiting)     Past Surgical History:  Procedure Laterality Date  . ABDOMINAL HYSTERECTOMY    . BREAST LUMPECTOMY Left   . BREAST LUMPECTOMY WITH RADIOACTIVE SEED AND SENTINEL LYMPH NODE BIOPSY Right 03/26/2015   Procedure: BREAST LUMPECTOMY WITH RADIOACTIVE SEED AND SENTINEL LYMPH NODE BIOPSY;  Surgeon: [name], MD;  Location: [place];  Service: General;  Laterality: Right;  . BUNIONECTOMY     BILAT  . LUMBAR LAMINECTOMY/DECOMPRESSION MICRODISCECTOMY Right 03/03/2016   Procedure: RIGHT LUMBAR FOUR-FIVE LUMBAR LAMINECTOMYAND MICRODISCECTOMY;  Surgeon: Jovita Gamma, MD;  Location: [place] [facility];  Service: Neurosurgery;  Laterality: Right;  right    There were no vitals filed for this visit.      Subjective Assessment - 05/04/16 0931    Subjective Pt reports back doing ok after busy weekend standing on concrete. Pt reports having some pain under Rt leg.    Pertinent History  breast CA, lumpectomy on R side s/p radiation   Limitations Sitting;House hold activities;Lifting   How long can you sit comfortably? 30 minutes   How long can you stand comfortably? unlimited   How long can you walk comfortably? unlimited   Diagnostic tests MRI prior to surgery   Patient Stated Goals Stop hurting, get stronger, build core strength, pt interested in yoga and pilates   Currently in Pain? Yes   Pain Score 3    Pain Location Back   Pain Orientation Right;Left   Pain Descriptors / Indicators Aching   Pain Type Surgical pain   Pain Onset More than a month ago   Pain Frequency Constant                         [facility] Adult PT Treatment/Exercise - 05/04/16 0001      Lumbar Exercises: Machines for Strengthening   Leg Press Seat 6 #70     Lumbar Exercises: Standing   Row Both;20 reps;Theraband;Power Engineer, civil (consulting) Both;20 reps;Theraband;Power Tower  #20   Other Standing Lumbar Exercises standing rocker board 2 min each direction  With ball toss     Lumbar Exercises: Seated   Sit to Stand 20 reps     Lumbar Exercises: Quadruped   Opposite Arm/Leg Raise Right arm/Left leg;Left arm/Right leg;5 reps     Knee/Hip Exercises: Standing  Hip Flexion Stengthening;Both;2 sets;10 reps  With yellow tband   Hip Abduction Stengthening;Both;2 sets;10 reps  With yellow tband   Hip Extension Stengthening;Both;2 sets;10 reps  with yellow tband     Moist Heat Therapy   Number Minutes Moist Heat 15 Minutes   Moist Heat Location Lumbar Spine     Electrical Stimulation   Electrical Stimulation Location Bil lumbar back   Electrical Stimulation Action IFC   Electrical Stimulation Parameters To tolerance   Electrical Stimulation Goals Pain                  PT Short Term Goals - 05/04/16 0936      PT SHORT TERM GOAL #1   Title Pt will be independent in her home excercise program   Baseline issued 04-06-2016   Time 3   Period Weeks    Status Achieved     PT SHORT TERM GOAL #2   Title Pt will report pain of </= 3/10 with functional ADLs   Time 3   Period Weeks   Status Partially Met           PT Long Term Goals - 05/04/16 0383      PT LONG TERM GOAL #1   Title Pt will be independent with HEP progression.   Time 6   Period Weeks   Status On-going     PT LONG TERM GOAL #2   Title Pt will improve FOTO score from 46 percent limitation to </= 36 percent limitation in order to improve functional mobility.   Time 6   Period Weeks   Status On-going     PT LONG TERM GOAL #3   Title Pt will improve right LE Hip flexion, abduction and extension to 5/5 in order to improve gait and functional transfers.   Time 6   Period Weeks   Status On-going     PT LONG TERM GOAL #4   Title Pt will be able to pick object up off the floor using correct body mechanics with no pain reported.   Time 6   Period Weeks   Status On-going               Plan - 05/04/16 1025    Clinical Impression Statement Pt reports some stiffness in back after very busy weekend. Pt able to progress with all strengthening and balance exercises. Pt will continue to benefit from skilled therapy for core strenght and stabillity.    Rehab Potential Excellent   PT Frequency 2x / week   PT Duration 6 weeks   PT Next Visit Plan Practice body mechanics, core strengthening;  right ankle strengthening especially dorsiflexors/evertors;  cardio for endurance;  soft tissue work; e-stim/heat as needed   Consulted and Agree with Plan of Care Patient      Patient will benefit from skilled therapeutic intervention in order to improve the following deficits and impairments:  Decreased activity tolerance, Decreased strength, Pain, Difficulty walking, Decreased balance, Decreased mobility, Postural dysfunction, Impaired perceived functional ability, Decreased range of motion  Visit Diagnosis: Acute midline low back pain without sciatica     Problem  List Patient Active Problem List   Diagnosis Date Noted  . HNP (herniated nucleus pulposus), lumbar 03/03/2016  . At high risk for bleeding 03/19/2015  . 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 female breast ([place]) 03/17/2015    Mikle Bosworth PTA 05/04/2016, 11:28 AM  [facility] [address]  [place], [place], [address] Phone: [phone]   Fax:  [phone]  Name: Candace Campbell MRN: [id number] Date of Birth: May 04, 1956

## 2016-05-06 ENCOUNTER — Ambulatory Visit: Payer: BC Managed Care – PPO | Attending: Neurosurgery | Admitting: Physical Therapy

## 2016-05-06 DIAGNOSIS — M545 Low back pain, unspecified: Secondary | ICD-10-CM

## 2016-05-06 DIAGNOSIS — M6281 Muscle weakness (generalized): Secondary | ICD-10-CM | POA: Insufficient documentation

## 2016-05-06 DIAGNOSIS — C50511 Malignant neoplasm of lower-outer quadrant of right female breast: Secondary | ICD-10-CM | POA: Diagnosis present

## 2016-05-06 NOTE — Therapy (Signed)
[facility] [address] [place], [place], [address] Phone: [phone]   Fax:  [phone]  Physical Therapy Treatment  Patient Details  Name: Candace Campbell MRN: [id number] Date of Birth: 1956-04-01 Referring Provider: Jovita Gamma, MD  Encounter Date: 05/06/2016      PT End of Session - 05/06/16 0915    Visit Number 9   Number of Visits 13   Date for PT Re-Evaluation 06/06/16   PT Start Time 0845   PT Stop Time 0930   PT Time Calculation (min) 45 min   Activity Tolerance Patient tolerated treatment well      Past Medical History:  Diagnosis Date  . Anxiety   . Blood dyscrasia    states daughter has Von Willebrand and she was tested 03-19-15 by Dr Candace Campbell  . Breast cancer ([place]) 03/13/15   right breast   . Depression   . GERD (gastroesophageal reflux disease)   . Headache    HX MIGRAINES  . History of kidney stones   . PONV (postoperative nausea and vomiting)     Past Surgical History:  Procedure Laterality Date  . ABDOMINAL HYSTERECTOMY    . BREAST LUMPECTOMY Left   . BREAST LUMPECTOMY WITH RADIOACTIVE SEED AND SENTINEL LYMPH NODE BIOPSY Right 03/26/2015   Procedure: BREAST LUMPECTOMY WITH RADIOACTIVE SEED AND SENTINEL LYMPH NODE BIOPSY;  Surgeon: Candace Klein, MD;  Location: [place];  Service: General;  Laterality: Right;  . BUNIONECTOMY     BILAT  . LUMBAR LAMINECTOMY/DECOMPRESSION MICRODISCECTOMY Right 03/03/2016   Procedure: RIGHT LUMBAR FOUR-FIVE LUMBAR LAMINECTOMYAND MICRODISCECTOMY;  Surgeon: Candace Gamma, MD;  Location: [place] [facility];  Service: Neurosurgery;  Laterality: Right;  right    There were no vitals filed for this visit.      Subjective Assessment - 05/06/16 0843    Subjective Reports mild back pain today.  Has some leg pain with walking.  I've been sitting a lot doing a lot of work orders.  I have tripped a little but I do think it's getting better.  Has a big  event this weekend.     Currently in Pain? Yes   Pain Score 4    Pain Location Back   Pain Orientation Right;Lower   Pain Type Surgical pain   Aggravating Factors  prolonged sitting                         [facility] Adult PT Treatment/Exercise - 05/06/16 0001      Lumbar Exercises: Standing   Row Both;20 reps;Theraband;Power Engineer, civil (consulting) Both;20 reps;Theraband;Power [employer]  #20   Other Standing Lumbar Exercises ab brace with 3# around waist, shoulder to shoulder and overhead press 1 min each   Other Standing Lumbar Exercises single leg with diagonal green band 10x 2 directions     Knee/Hip Exercises: Aerobic   Stationary Bike 6 min     Knee/Hip Exercises: Machines for Strengthening   Total Gym Leg Press 70# seat 6 15x B; single leg only 40# right/left 2x10 each     Moist Heat Therapy   Number Minutes Moist Heat 15 Minutes   Moist Heat Location Lumbar Spine     Electrical Stimulation   Electrical Stimulation Location Bil lumbar back   Electrical Stimulation Action  IFC   Electrical Stimulation Parameters 14 ma 15 min in supine   Electrical Stimulation Goals Pain  PT Short Term Goals - 05/06/16 0928      PT SHORT TERM GOAL #1   Title Pt will be independent in her home excercise program   Status Achieved     PT SHORT TERM GOAL #2   Title Pt will report pain of </= 3/10 with functional ADLs   Time 3   Period Weeks   Status Partially Met           PT Long Term Goals - 05/06/16 1638      PT LONG TERM GOAL #1   Title Pt will be independent with HEP progression.   Time 6   Period Weeks   Status On-going     PT LONG TERM GOAL #2   Title Pt will improve FOTO score from 46 percent limitation to </= 36 percent limitation in order to improve functional mobility.   Time 6   Period Weeks   Status On-going     PT LONG TERM GOAL #3   Title Pt will improve right LE Hip flexion, abduction and extension to 5/5  in order to improve gait and functional transfers.   Time 6   Period Weeks   Status On-going     PT LONG TERM GOAL #4   Title Pt will be able to pick object up off the floor using correct body mechanics with no pain reported.   Time 6   Period Weeks   Status On-going               Plan - 05/06/16 0915    Clinical Impression Statement The patient responds well to intermediate level core strengthening in standing.  Verbal cues for patellofemoral alignment.  History of ligamentous laxity.  She has difficulty with single leg standing secondary to weakness in ankle evertors.  Therapist closely monitoring response with all treatment interventions and modifying as needed.     PT Next Visit Plan Practice body mechanics, core strengthening;  right ankle strengthening especially dorsiflexors/evertors;  cardio for endurance; e-stim/heat as needed      Patient will benefit from skilled therapeutic intervention in order to improve the following deficits and impairments:     Visit Diagnosis: Acute midline low back pain without sciatica  Muscle weakness (generalized)     Problem List Patient Active Problem List   Diagnosis Date Noted  . HNP (herniated nucleus pulposus), lumbar 03/03/2016  . At high risk for bleeding 03/19/2015  . 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 female breast ([place]) 03/17/2015   Candace Campbell, PT 05/06/16 9:30 AM Phone: [phone] Fax: [phone]  Candace Campbell 05/06/2016, 9:29 AM  [facility] [address], [place], [place], [address] Phone: [phone]   Fax:  [phone]  Name: Candace Campbell MRN: [id number] Date of Birth: Aug 03, 1955

## 2016-05-18 ENCOUNTER — Ambulatory Visit: Payer: BC Managed Care – PPO | Admitting: Physical Therapy

## 2016-05-18 DIAGNOSIS — M545 Low back pain, unspecified: Secondary | ICD-10-CM

## 2016-05-18 DIAGNOSIS — M6281 Muscle weakness (generalized): Secondary | ICD-10-CM

## 2016-05-18 NOTE — Therapy (Signed)
[facility] [address] [place], [place], [address] Phone: [phone]   Fax:  [phone]  Physical Therapy Treatment  Patient Details  Name: Candace Campbell MRN: [id number] Date of Birth: Jun 03, 1956 Referring Provider: Jovita Gamma, MD  Encounter Date: 05/18/2016      PT End of Session - 05/18/16 0957    Visit Number 10   Number of Visits 13   Date for PT Re-Evaluation 06/06/16   PT Start Time 0930   PT Stop Time 1025   PT Time Calculation (min) 55 min   Activity Tolerance Patient tolerated treatment well      Past Medical History:  Diagnosis Date  . Anxiety   . Blood dyscrasia    states daughter has Von Willebrand and she was tested 03-19-15 by Dr Lindi Adie  . Breast cancer ([place]) 03/13/15   right breast   . Depression   . GERD (gastroesophageal reflux disease)   . Headache    HX MIGRAINES  . History of kidney stones   . PONV (postoperative nausea and vomiting)     Past Surgical History:  Procedure Laterality Date  . ABDOMINAL HYSTERECTOMY    . BREAST LUMPECTOMY Left   . BREAST LUMPECTOMY WITH RADIOACTIVE SEED AND SENTINEL LYMPH NODE BIOPSY Right 03/26/2015   Procedure: BREAST LUMPECTOMY WITH RADIOACTIVE SEED AND SENTINEL LYMPH NODE BIOPSY;  Surgeon: [name], MD;  Location: [facility];  Service: General;  Laterality: Right;  . BUNIONECTOMY     BILAT  . LUMBAR LAMINECTOMY/DECOMPRESSION MICRODISCECTOMY Right 03/03/2016   Procedure: RIGHT LUMBAR FOUR-FIVE LUMBAR LAMINECTOMYAND MICRODISCECTOMY;  Surgeon: Jovita Gamma, MD;  Location: [place] [facility];  Service: Neurosurgery;  Laterality: Right;  right    There were no vitals filed for this visit.      Subjective Assessment - 05/18/16 0934    Subjective I stood on concrete for a long time, did some gardening (on my knees) and I've had a lot of orders so I had a relapse.  I've been lying on heat a lot.  10 weeks s/p surgery.     Currently  in Pain? Yes   Pain Score 5    Pain Location Back   Pain Orientation Right;Lower   Pain Type Surgical pain                         [facility] Adult PT Treatment/Exercise - 05/18/16 0001      Lumbar Exercises: Aerobic   Stationary Bike Nu-Step L1 6 min     Lumbar Exercises: Standing   Other Standing Lumbar Exercises wall slides 15x   Other Standing Lumbar Exercises single leg with diagonal green band 10x 2 directions     Lumbar Exercises: Supine   Ab Set 5 reps   Bent Knee Raise 10 reps   Isometric Hip Flexion 10 reps   Other Supine Lumbar Exercises green band clams single, single then double 10x each     Lumbar Exercises: Prone   Other Prone Lumbar Exercises arm lift extension with head lift 2x 5   Other Prone Lumbar Exercises lumbar multifidi series:  5x each press, HS curls, hip extension small range     Moist Heat Therapy   Number Minutes Moist Heat 15 Minutes   Moist Heat Location Lumbar Spine     Electrical Stimulation   Electrical Stimulation Location Bil lumbar back   Electrical Stimulation Action IFC   Electrical Stimulation Parameters 14 ma  15 min supine   Electrical Stimulation Goals Pain                  PT Short Term Goals - 05/18/16 1013      PT SHORT TERM GOAL #1   Title Pt will be independent in her home excercise program   Status Achieved     PT SHORT TERM GOAL #2   Title Pt will report pain of </= 3/10 with functional ADLs   Time 3   Period Weeks   Status Partially Met           PT Long Term Goals - 05/18/16 1013      PT LONG TERM GOAL #1   Title Pt will be independent with HEP progression.   Time 6   Period Weeks   Status On-going     PT LONG TERM GOAL #2   Title Pt will improve FOTO score from 46 percent limitation to </= 36 percent limitation in order to improve functional mobility.   Time 6   Period Weeks   Status On-going     PT LONG TERM GOAL #3   Title Pt will improve right LE Hip flexion, abduction  and extension to 5/5 in order to improve gait and functional transfers.   Time 6   Period Weeks   Status On-going     PT LONG TERM GOAL #4   Title Pt will be able to pick object up off the floor using correct body mechanics with no pain reported.   Time 6   Period Weeks   Status On-going               Plan - 05/18/16 0957    Clinical Impression Statement The patient is recovering from an exacerbation last week after working an event where she was standing on concrete, gardening while kneeling and digging, in addition to sitting to complete work orders.  She is receptive to continuation of needed core strengthening with reports of "feeling weak" with prone multifidi single leg lifts.  Verbal cues to avoid lifting her leg too high which hyperextends lumbar spine.  Some achiness reports with standing.  Good pain relief with e-stim/heat.  Therapist closely monitoring response with all interventions.  Will benefit from continued stabilization including progression to kneeling and half kneeling as exacerbation subsides.     PT Next Visit Plan Practice body mechanics, core/stabilization  strengthening;  right ankle strengthening especially dorsiflexors/evertors;  cardio for endurance; e-stim/heat as needed      Patient will benefit from skilled therapeutic intervention in order to improve the following deficits and impairments:     Visit Diagnosis: Acute midline low back pain without sciatica  Muscle weakness (generalized)     Problem List Patient Active Problem List   Diagnosis Date Noted  . HNP (herniated nucleus pulposus), lumbar 03/03/2016  . At high risk for bleeding 03/19/2015  . 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 female breast ([place]) 03/17/2015   [name], PT 05/18/16 11:08 AM Phone: [phone] Fax: [phone]  [name] 05/18/2016, 11:07 AM  [facility] [address] [place], [place], [address] Phone: [phone]   Fax:  [phone]  Name: Candace Campbell MRN: [id number] Date of Birth: 02/24/1956

## 2016-05-20 ENCOUNTER — Encounter: Payer: Self-pay | Admitting: Physical Therapy

## 2016-05-20 ENCOUNTER — Ambulatory Visit: Payer: BC Managed Care – PPO | Admitting: Physical Therapy

## 2016-05-20 DIAGNOSIS — M545 Low back pain, unspecified: Secondary | ICD-10-CM

## 2016-05-20 DIAGNOSIS — C50511 Malignant neoplasm of lower-outer quadrant of right female breast: Secondary | ICD-10-CM

## 2016-05-20 DIAGNOSIS — M6281 Muscle weakness (generalized): Secondary | ICD-10-CM

## 2016-05-20 NOTE — Therapy (Signed)
[facility] [address] [place], [place], [address] Phone: [phone]   Fax:  [phone]  Physical Therapy Treatment  Patient Details  Name: Candace Campbell MRN: [id number] Date of Birth: [birth year] Referring Provider: Jovita Gamma, MD  Encounter Date: 05/20/2016      PT End of Session - 05/20/16 0932    Visit Number 11   Number of Visits 13   Date for PT Re-Evaluation 06/06/16   PT Start Time 0933   PT Stop Time 1030   PT Time Calculation (min) 57 min   Activity Tolerance Patient tolerated treatment well   Behavior During Therapy [facility] for tasks assessed/performed      Past Medical History:  Diagnosis Date  . Anxiety   . Blood dyscrasia    states daughter has Von Willebrand and she was tested 03-19-15 by Dr Lindi Adie  . Breast cancer ([place]) 03/13/15   right breast   . Depression   . GERD (gastroesophageal reflux disease)   . Headache    HX MIGRAINES  . History of kidney stones   . PONV (postoperative nausea and vomiting)     Past Surgical History:  Procedure Laterality Date  . ABDOMINAL HYSTERECTOMY    . BREAST LUMPECTOMY Left   . BREAST LUMPECTOMY WITH RADIOACTIVE SEED AND SENTINEL LYMPH NODE BIOPSY Right 03/26/2015   Procedure: BREAST LUMPECTOMY WITH RADIOACTIVE SEED AND SENTINEL LYMPH NODE BIOPSY;  Surgeon: [name], MD;  Location: [place];  Service: General;  Laterality: Right;  . BUNIONECTOMY     BILAT  . LUMBAR LAMINECTOMY/DECOMPRESSION MICRODISCECTOMY Right 03/03/2016   Procedure: RIGHT LUMBAR FOUR-FIVE LUMBAR LAMINECTOMYAND MICRODISCECTOMY;  Surgeon: Jovita Gamma, MD;  Location: [place] [facility];  Service: Neurosurgery;  Laterality: Right;  right    There were no vitals filed for this visit.      Subjective Assessment - 05/20/16 0935    Subjective Pt states she is doing okay.  She states her back was fatigued yesterday so she laid down .     Pertinent History breast CA,  lumpectomy on R side s/p radiation   Limitations Sitting;House hold activities;Lifting   How long can you sit comfortably? 60 minutes   How long can you stand comfortably? unlimited   How long can you walk comfortably? unlimited   Diagnostic tests MRI prior to surgery   Patient Stated Goals Stop hurting, get stronger, build core strength, pt interested in yoga and pilates   Currently in Pain? Yes   Pain Score 4    Pain Location Back   Pain Orientation Right;Lower   Pain Descriptors / Indicators Aching   Pain Type Surgical pain   Pain Onset More than a month ago   Pain Frequency Constant   Aggravating Factors  prolonged sitting   Pain Relieving Factors walking, heat   Multiple Pain Sites No                         [facility] Adult PT Treatment/Exercise - 05/20/16 0001      Lumbar Exercises: Stretches   Passive Hamstring Stretch 3 reps  standing using step Rt - 20 sec     Lumbar Exercises: Aerobic   Stationary Bike Nu-Step L1 10 min     Lumbar Exercises: Machines for Strengthening   Leg Press Seat 6 #75  20x   Other Lumbar Machine Exercise lat pull down 15# x 10; 20# x 10  VC for upright posture  and stabilize with core     Lumbar Exercises: Standing   Other Standing Lumbar Exercises wall slides 15x   Other Standing Lumbar Exercises single leg with diagonal green band 10x 2 directions     Lumbar Exercises: Supine   Bridge 5 seconds  20 reps     Lumbar Exercises: Quadruped   Opposite Arm/Leg Raise Right arm/Left leg;Left arm/Right leg;5 reps  6x each with opposite elbow to knee touch     Knee/Hip Exercises: Standing   Forward Lunges 10 reps  bilateral - stationary     Moist Heat Therapy   Number Minutes Moist Heat 15 Minutes   Moist Heat Location Lumbar Spine     Electrical Stimulation   Electrical Stimulation Location Bil lumbar back   Electrical Stimulation Action IFC   Electrical Stimulation Parameters 15 minutes supine; Level 11   Electrical  Stimulation Goals Pain                  PT Short Term Goals - 05/20/16 0940      PT SHORT TERM GOAL #2   Title Pt will report pain of </= 3/10 with functional ADLs   Baseline just above a 3/10, but had a flare up since overworking last week   Time 3   Period Weeks   Status Partially Met           PT Long Term Goals - 05/20/16 0941      PT LONG TERM GOAL #1   Title Pt will be independent with HEP progression.   Time 6   Period Weeks   Status On-going     PT LONG TERM GOAL #2   Title Pt will improve FOTO score from 46 percent limitation to </= 36 percent limitation in order to improve functional mobility.   Time 6   Status On-going     PT LONG TERM GOAL #3   Title Pt will improve right LE Hip flexion, abduction and extension to 5/5 in order to improve gait and functional transfers.   Time 6   Period Weeks   Status On-going     PT LONG TERM GOAL #4   Title Pt will be able to pick object up off the floor using correct body mechanics with no pain reported.   Baseline needs to use the grabber   Time 6   Period Weeks   Status On-going               Plan - 05/20/16 1136    Clinical Impression Statement Pt was able to demonstrate good endurance with exercises and tolerated 40 minutes of strengthening and cardio combined.  Pt able to progress to half kneeling stationary squats but demonstrates fatigue due to hip drop after performing 10 reps of stationary lunges.  Pt needs tactile feedback during quadruped exercises in order to maintain neutral spine.  Will benefit from skilled PT to continue to strengthen core and hip stability to maintain neutral spine during functional activities.   Rehab Potential Excellent   PT Frequency 2x / week   PT Duration 6 weeks   PT Treatment/Interventions ADLs/Self Care Home Management;Patient/family education;Taping;Dry needling;Cryotherapy;Electrical Stimulation;Iontophoresis 27m/ml Dexamethasone;Moist Heat;Neuromuscular  re-education;Therapeutic exercise;Therapeutic activities;Functional mobility training;Gait training;Stair training;Manual techniques;Passive range of motion   PT Next Visit Plan Practice body mechanics, core/stabilization  strengthening;  right ankle strengthening especially dorsiflexors/evertors;  cardio for endurance; e-stim/heat as needed   PT Home Exercise Plan PPT, bridges, supine marching, single leg bridge,    Consulted  and Agree with Plan of Care Patient      Patient will benefit from skilled therapeutic intervention in order to improve the following deficits and impairments:  Decreased activity tolerance, Decreased strength, Pain, Difficulty walking, Decreased balance, Decreased mobility, Postural dysfunction, Impaired perceived functional ability, Decreased range of motion  Visit Diagnosis: Acute midline low back pain without sciatica  Muscle weakness (generalized)  Carcinoma of lower-outer quadrant of right female breast, unspecified estrogen receptor status ([place])     Problem List Patient Active Problem List   Diagnosis Date Noted  . HNP (herniated nucleus pulposus), lumbar 03/03/2016  . At high risk for bleeding 03/19/2015  . 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 female breast ([place]) 03/17/2015    Zannie Cove, PT 05/20/2016, 11:43 AM  [facility] [address], [place], [place], [address] Phone: [phone]   Fax:  [phone]  Name: ROCHEL PRIVETT MRN: [id number] Date of Birth: 06-21-1956

## 2016-05-25 ENCOUNTER — Encounter: Payer: Self-pay | Admitting: Physical Therapy

## 2016-05-25 ENCOUNTER — Ambulatory Visit: Payer: BC Managed Care – PPO | Admitting: Physical Therapy

## 2016-05-25 DIAGNOSIS — M545 Low back pain, unspecified: Secondary | ICD-10-CM

## 2016-05-25 DIAGNOSIS — C50511 Malignant neoplasm of lower-outer quadrant of right female breast: Secondary | ICD-10-CM

## 2016-05-25 DIAGNOSIS — M6281 Muscle weakness (generalized): Secondary | ICD-10-CM

## 2016-05-25 NOTE — Therapy (Addendum)
[facility] [address], [place], [place], [address] Phone: [phone]   Fax:  [phone]  Physical Therapy Treatment  Patient Details  Name: Candace Campbell MRN: [id number] Date of Birth: 11-Mar-1956 Referring Provider: Jovita Gamma, MD  Encounter Date: 05/25/2016      PT End of Session - 05/25/16 0927    Visit Number 12   Number of Visits 13   Date for PT Re-Evaluation 06/06/16   PT Start Time 0846   PT Stop Time 0940   PT Time Calculation (min) 54 min   Activity Tolerance Patient tolerated treatment well   Behavior During Therapy [facility] for tasks assessed/performed      Past Medical History:  Diagnosis Date  . Anxiety   . Blood dyscrasia    states daughter has Von Willebrand and she was tested 03-19-15 by Dr Lindi Adie  . Breast cancer ([place]) 03/13/15   right breast   . Depression   . GERD (gastroesophageal reflux disease)   . Headache    HX MIGRAINES  . History of kidney stones   . PONV (postoperative nausea and vomiting)     Past Surgical History:  Procedure Laterality Date  . ABDOMINAL HYSTERECTOMY    . BREAST LUMPECTOMY Left   . BREAST LUMPECTOMY WITH RADIOACTIVE SEED AND SENTINEL LYMPH NODE BIOPSY Right 03/26/2015   Procedure: BREAST LUMPECTOMY WITH RADIOACTIVE SEED AND SENTINEL LYMPH NODE BIOPSY;  Surgeon: [name], MD;  Location: [place];  Service: General;  Laterality: Right;  . BUNIONECTOMY     BILAT  . LUMBAR LAMINECTOMY/DECOMPRESSION MICRODISCECTOMY Right 03/03/2016   Procedure: RIGHT LUMBAR FOUR-FIVE LUMBAR LAMINECTOMYAND MICRODISCECTOMY;  Surgeon: Jovita Gamma, MD;  Location: [place] [facility];  Service: Neurosurgery;  Laterality: Right;  right    There were no vitals filed for this visit.      Subjective Assessment - 05/25/16 0851    Subjective Pt states shes feeling slow and tired. Did a lot of cleaning and packing yesterday. Tried to use good body mechanics but having  increased knee pain. 7/10 knee pain today.    Pertinent History breast CA, lumpectomy on R side s/p radiation   Limitations Sitting;House hold activities;Lifting   How long can you sit comfortably? 60 minutes   How long can you stand comfortably? unlimited   How long can you walk comfortably? unlimited   Diagnostic tests MRI prior to surgery   Patient Stated Goals Stop hurting, get stronger, build core strength, pt interested in yoga and pilates   Currently in Pain? Yes   Pain Score 3    Pain Location Back   Pain Orientation Right;Lower   Pain Descriptors / Indicators Aching   Pain Type Surgical pain   Pain Onset More than a month ago   Pain Frequency Constant   Aggravating Factors  prolonged sitting   Pain Relieving Factors walking, heat   Multiple Pain Sites No                         [facility] Adult PT Treatment/Exercise - 05/25/16 0001      Lumbar Exercises: Stretches   Active Hamstring Stretch 2 reps;10 seconds   Quad Stretch 2 reps;10 seconds   Piriformis Stretch 2 reps;10 seconds     Lumbar Exercises: Machines for Strengthening   Cybex Knee Flexion #15 3x10   Leg Press Seat 6 #75  20x     Lumbar Exercises: Standing   Row Strengthening;Power  tower;Both;20 reps  #20   Shoulder Extension Strengthening;Power Tower;Both;20 reps  #20     Nustep L2x 8 minutes with therapist discussing treatment Estim to Bil lumbar paraspinals IFC for pain with moist heat pack. Mikle Bosworth, PTA 05/25/16 9:57 AM             PT Short Term Goals - 05/25/16 0859      PT SHORT TERM GOAL #2   Title Pt will report pain of </= 3/10 with functional ADLs   Baseline --   Time 3   Period Weeks   Status Achieved  3/10 pain in back           PT Long Term Goals - 05/25/16 0900      PT LONG TERM GOAL #1   Title Pt will be independent with HEP progression.   Time 6   Period Weeks   Status On-going     PT LONG TERM GOAL #2   Title Pt will improve FOTO  score from 46 percent limitation to </= 36 percent limitation in order to improve functional mobility.   Time 6   Period Weeks   Status On-going     PT LONG TERM GOAL #3   Title Pt will improve right LE Hip flexion, abduction and extension to 5/5 in order to improve gait and functional transfers.   Time 6   Period Weeks   Status On-going     PT LONG TERM GOAL #4   Title Pt will be able to pick object up off the floor using correct body mechanics with no pain reported.   Baseline --   Time 6   Period Weeks   Status Partially Met  good body mechanics but still has pain               Plan - 05/25/16 0932    Clinical Impression Statement Pt having increase knee pain today. Able to tolerate all strengthening exercises and stretches well. Pt will continue to benefit from skilled therapy for Bil LE and core strengthening.    Rehab Potential Excellent   PT Frequency 2x / week   PT Duration 6 weeks   PT Treatment/Interventions ADLs/Self Care Home Management;Patient/family education;Taping;Dry needling;Cryotherapy;Electrical Stimulation;Iontophoresis 75m/ml Dexamethasone;Moist Heat;Neuromuscular re-education;Therapeutic exercise;Therapeutic activities;Functional mobility training;Gait training;Stair training;Manual techniques;Passive range of motion   PT Next Visit Plan core stability, static and dynamic balance   Consulted and Agree with Plan of Care Patient      Patient will benefit from skilled therapeutic intervention in order to improve the following deficits and impairments:  Decreased activity tolerance, Decreased strength, Pain, Difficulty walking, Decreased balance, Decreased mobility, Postural dysfunction, Impaired perceived functional ability, Decreased range of motion  Visit Diagnosis: Acute midline low back pain without sciatica  Muscle weakness (generalized)  Carcinoma of lower-outer quadrant of right female breast, unspecified estrogen receptor status  ([place]     Problem List Patient Active Problem List   Diagnosis Date Noted  . HNP (herniated nucleus pulposus), lumbar 03/03/2016  . At high risk for bleeding 03/19/2015  . 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 female breast ([place] 03/17/2015    KMikle BosworthPTA 05/25/2016, 9:37 AM  [facility] [address] [place] [address]Phone: [phone]  Fax:  [phone] Name: Candace MCQUOWNMRN: [id number] of Birth: 109/28/1957

## 2016-05-31 NOTE — Assessment & Plan Note (Signed)
Rt Lumpectomy 03/26/15: 1.8 cm IDC grade 1, 0/6 LN negative, Er 100%, PR 70%, Her 2 Neg, Ki 67: 5% T1cN0 Stage 1A Oncotype DX score 16, 10% ROR Radiation therapy started 05/25/2015 discontinued after 12 fractions because of nonhealing wound infection  Current treatment: Adjuvant antiestrogen therapy with anastrozole 1 mg daily 5 years started 08/26/2015  High bleeding risk/poor wound healing: Breast wound finally closed with multiple rounds of antibiotics and wound care Severe osteopenia T score -2.4: Currently on alendronate with calcium and vitamin D  Return to clinic in 6 months for follow

## 2016-06-01 ENCOUNTER — Encounter: Payer: Self-pay | Admitting: Physical Therapy

## 2016-06-01 ENCOUNTER — Ambulatory Visit (HOSPITAL_BASED_OUTPATIENT_CLINIC_OR_DEPARTMENT_OTHER): Payer: BC Managed Care – PPO | Admitting: Hematology and Oncology

## 2016-06-01 ENCOUNTER — Ambulatory Visit (HOSPITAL_BASED_OUTPATIENT_CLINIC_OR_DEPARTMENT_OTHER): Payer: BC Managed Care – PPO

## 2016-06-01 ENCOUNTER — Ambulatory Visit: Payer: BC Managed Care – PPO | Admitting: Physical Therapy

## 2016-06-01 ENCOUNTER — Encounter: Payer: Self-pay | Admitting: Hematology and Oncology

## 2016-06-01 DIAGNOSIS — Z79811 Long term (current) use of aromatase inhibitors: Secondary | ICD-10-CM

## 2016-06-01 DIAGNOSIS — C50311 Malignant neoplasm of lower-inner quadrant of right female breast: Secondary | ICD-10-CM

## 2016-06-01 DIAGNOSIS — M545 Low back pain, unspecified: Secondary | ICD-10-CM

## 2016-06-01 DIAGNOSIS — Z170 Estrogen receptor positive status [ER+]: Secondary | ICD-10-CM

## 2016-06-01 DIAGNOSIS — M8580 Other specified disorders of bone density and structure, unspecified site: Secondary | ICD-10-CM | POA: Diagnosis not present

## 2016-06-01 DIAGNOSIS — M6281 Muscle weakness (generalized): Secondary | ICD-10-CM

## 2016-06-01 LAB — COMPREHENSIVE METABOLIC PANEL
ALT: 23 U/L (ref 0–55)
AST: 23 U/L (ref 5–34)
Albumin: 4 g/dL (ref 3.5–5.0)
Alkaline Phosphatase: 58 U/L (ref 40–150)
Anion Gap: 8 mEq/L (ref 3–11)
BILIRUBIN TOTAL: 0.27 mg/dL (ref 0.20–1.20)
BUN: 12.6 mg/dL (ref 7.0–26.0)
CHLORIDE: 103 meq/L (ref 98–109)
CO2: 30 meq/L — AB (ref 22–29)
CREATININE: 0.8 mg/dL (ref 0.6–1.1)
Calcium: 9.6 mg/dL (ref 8.4–10.4)
EGFR: 83 mL/min/{1.73_m2} — ABNORMAL LOW (ref >90)
GLUCOSE: 85 mg/dL (ref 70–140)
Potassium: 4.1 mEq/L (ref 3.5–5.1)
SODIUM: 141 meq/L (ref 136–145)
TOTAL PROTEIN: 6.9 g/dL (ref 6.4–8.3)

## 2016-06-01 LAB — CBC WITH DIFFERENTIAL/PLATELET
BASO%: 0.5 % (ref 0.0–2.0)
Basophils Absolute: 0 10*3/uL (ref 0.0–0.1)
EOS%: 2 % (ref 0.0–7.0)
Eosinophils Absolute: 0.1 10*3/uL (ref 0.0–0.5)
HCT: 41.9 % (ref 34.8–46.6)
HGB: 13.7 g/dL (ref 11.6–15.9)
LYMPH%: 29.9 % (ref 14.0–49.7)
MCH: 30.2 pg (ref 25.1–34.0)
MCHC: 32.7 g/dL (ref 31.5–36.0)
MCV: 92.5 fL (ref 79.5–101.0)
MONO#: 0.5 10*3/uL (ref 0.1–0.9)
MONO%: 10.6 % (ref 0.0–14.0)
NEUT%: 57 % (ref 38.4–76.8)
NEUTROS ABS: 2.5 10*3/uL (ref 1.5–6.5)
PLATELETS: 209 10*3/uL (ref 145–400)
RBC: 4.53 10*6/uL (ref 3.70–5.45)
RDW: 13.3 % (ref 11.2–14.5)
WBC: 4.4 10*3/uL (ref 3.9–10.3)
lymph#: 1.3 10*3/uL (ref 0.9–3.3)

## 2016-06-01 LAB — TSH: TSH: 2.406 m(IU)/L (ref 0.308–3.960)

## 2016-06-01 NOTE — Therapy (Signed)
[facility] [address], [place], [place], [address] Phone: [phone]   Fax:  [phone]  Physical Therapy Treatment  Patient Details  Name: Candace Campbell MRN: [id number] Date of Birth: 1956-03-16 Referring Provider: Jovita Gamma, MD  Encounter Date: 06/01/2016      PT End of Session - 06/01/16 1408    Visit Number 13   Number of Visits 13   Date for PT Re-Evaluation 06/06/16   PT Start Time 1401   PT Stop Time 1439   PT Time Calculation (min) 38 min   Activity Tolerance Patient tolerated treatment well   Behavior During Therapy [facility] for tasks assessed/performed      Past Medical History:  Diagnosis Date  . Anxiety   . Blood dyscrasia    states daughter has Von Willebrand and she was tested 03-19-15 by Dr Lindi Adie  . Breast cancer ([place]) 03/13/15   right breast   . Depression   . GERD (gastroesophageal reflux disease)   . Headache    HX MIGRAINES  . History of kidney stones   . PONV (postoperative nausea and vomiting)     Past Surgical History:  Procedure Laterality Date  . ABDOMINAL HYSTERECTOMY    . BREAST LUMPECTOMY Left   . BREAST LUMPECTOMY WITH RADIOACTIVE SEED AND SENTINEL LYMPH NODE BIOPSY Right 03/26/2015   Procedure: BREAST LUMPECTOMY WITH RADIOACTIVE SEED AND SENTINEL LYMPH NODE BIOPSY;  Surgeon: [name], MD;  Location: [place];  Service: General;  Laterality: Right;  . BUNIONECTOMY     BILAT  . LUMBAR LAMINECTOMY/DECOMPRESSION MICRODISCECTOMY Right 03/03/2016   Procedure: RIGHT LUMBAR FOUR-FIVE LUMBAR LAMINECTOMYAND MICRODISCECTOMY;  Surgeon: Jovita Gamma, MD;  Location: [place] [facility];  Service: Neurosurgery;  Laterality: Right;  right    There were no vitals filed for this visit.      Subjective Assessment - 06/01/16 1405    Subjective Pt states back is feeling good, ankles feeling ok, Rt knee is causeing problems.   Pertinent History breast CA, lumpectomy on R  side s/p radiation   Limitations Sitting;House hold activities;Lifting   How long can you sit comfortably? 60 minutes   How long can you stand comfortably? unlimited   How long can you walk comfortably? unlimited   Diagnostic tests MRI prior to surgery   Patient Stated Goals Stop hurting, get stronger, build core strength, pt interested in yoga and pilates   Currently in Pain? Yes   Pain Score 2    Pain Location Back   Pain Orientation Right;Lower   Pain Descriptors / Indicators Aching   Pain Type Surgical pain   Pain Onset More than a month ago   Pain Frequency Constant   Multiple Pain Sites No                         [facility] Adult PT Treatment/Exercise - 06/01/16 0001      Lumbar Exercises: Stretches   Active Hamstring Stretch 2 reps;10 seconds   Quad Stretch 2 reps;10 seconds   Piriformis Stretch 2 reps;10 seconds     Lumbar Exercises: Aerobic   Elliptical L1 x 6 minutes     Lumbar Exercises: Machines for Strengthening   Leg Press Seat 6 #80  20x     Lumbar Exercises: Quadruped   Opposite Arm/Leg Raise Right arm/Left leg;Left arm/Right leg;5 reps     Knee/Hip Exercises: Standing   Hip Flexion Stengthening;Both;2 sets;10 reps  With yellow  tband   Hip Abduction Stengthening;Both;2 sets;10 reps  With yellow tband   Hip Extension Stengthening;Both;2 sets;10 reps  with yellow tband   Walking with Sports Cord x10 #25 forward x10 sideways                  PT Short Term Goals - 05/25/16 0859      PT SHORT TERM GOAL #2   Title Pt will report pain of </= 3/10 with functional ADLs   Baseline --   Time 3   Period Weeks   Status Achieved  3/10 pain in back           PT Long Term Goals - 06/01/16 1409      PT LONG TERM GOAL #1   Title Pt will be independent with HEP progression.   Time 6   Period Weeks   Status On-going     PT LONG TERM GOAL #2   Title Pt will improve FOTO score from 46 percent limitation to </= 36 percent limitation  in order to improve functional mobility.   Time 6   Period Weeks   Status On-going     PT LONG TERM GOAL #3   Title Pt will improve right LE Hip flexion, abduction and extension to 5/5 in order to improve gait and functional transfers.   Time 6   Period Weeks   Status Achieved     PT LONG TERM GOAL #4   Title Pt will be able to pick object up off the floor using correct body mechanics with no pain reported.   Baseline needs to use the grabber   Time 6   Period Weeks   Status Partially Met  knee pain               Plan - 06/01/16 1410    Clinical Impression Statement Pt reports back feeling good, only complaint is knee pain. Pt did well with all balance exercises and able to tolerate all strengthening exercises without increase in pain.  Will continue to progress balance and core stability.    Rehab Potential Excellent   PT Frequency 2x / week   PT Duration 6 weeks   PT Treatment/Interventions ADLs/Self Care Home Management;Patient/family education;Taping;Dry needling;Cryotherapy;Electrical Stimulation;Iontophoresis 21m/ml Dexamethasone;Moist Heat;Neuromuscular re-education;Therapeutic exercise;Therapeutic activities;Functional mobility training;Gait training;Stair training;Manual techniques;Passive range of motion   PT Next Visit Plan core stability, static and dynamic balance   Consulted and Agree with Plan of Care Patient      Patient will benefit from skilled therapeutic intervention in order to improve the following deficits and impairments:  Decreased activity tolerance, Decreased strength, Pain, Difficulty walking, Decreased balance, Decreased mobility, Postural dysfunction, Impaired perceived functional ability, Decreased range of motion  Visit Diagnosis: Acute midline low back pain without sciatica  Muscle weakness (generalized)     Problem List Patient Active Problem List   Diagnosis Date Noted  . HNP (herniated nucleus pulposus), lumbar 03/03/2016  . At  high risk for bleeding 03/19/2015  . 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 female breast ([place] 03/17/2015    KMikle BosworthPTA 06/01/2016, 2:46 PM  [facility] [address] [place] [address]Phone: [phone]  Fax:  [phone] Name: Candace KRIEGELMRN: [id number] of Birth: [birth year]

## 2016-06-01 NOTE — Progress Notes (Signed)
Patient Care Team: Darcus Austin, MD as PCP - General (Family Medicine) Thurnell Lose, MD as Consulting Physician (Obstetrics and Gynecology) [name], MD as Consulting Physician (General Surgery) Nicholas Lose, MD as Consulting Physician (Hematology and Oncology) Thea Silversmith, MD as Consulting Physician (Radiation Oncology) Thea Silversmith, MD as Consulting Physician (Radiation Oncology) Mauro Kaufmann, RN as Registered Nurse Rockwell Germany, RN as Registered Nurse Sylvan Cheese, NP as Nurse Practitioner (Nurse Practitioner)  DIAGNOSIS:  Encounter Diagnosis  Name Primary?  . Malignant neoplasm of lower-inner quadrant of right breast of female, estrogen receptor positive ([place])     SUMMARY OF ONCOLOGIC HISTORY:   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 female breast ([place])   03/04/2015 Mammogram    Right breast possible distortion, ultrasound 03/11/2015 suspicious mass right breast 6:00 irregular hypoechoic mass 0.7 x 0.6 x 0.6 cm, adjacent to this innumerable cysts and clusters of cysts measuring 0.7 cm      03/13/2015 Initial Biopsy    Right breast biopsy: Invasive ductal carcinoma grade 1, ER+ (100%), PR+ (70%),  HER-2 negative (ratio 1.44),  Ki-67 5%      03/13/2015 Clinical Stage    Stage IA: T1b N0      03/26/2015 Surgery    Rt Lumpectomy: 1.8 cm IDC grade 1, 0/6 LN negative, ER+ 100%, PR+ 70%, Her 2 Neg (ratio 1.35), Ki 67: 5%      03/26/2015 Pathologic Stage    Stage IA: pT1c pN0      03/26/2015 Oncotype testing    RS 16 (10% ROR)      05/21/2015 - 06/09/2015 Radiation Therapy    Adjuvant RT to right breast: 32.04 Gy over 12 fractions; treatment stopped because of wound infection      08/26/2015 -  Anti-estrogen oral therapy    Anastrozole 1 mg daily      09/09/2015 Survivorship    Survivorship visit completed and copy of care plan given to patient       CHIEF COMPLIANT: Follow-up on anastrozole  INTERVAL HISTORY: Candace Campbell is a  60 year old with above-mentioned history of right breast cancer treated with lumpectomy and adjuvant radiation and is currently on anastrozole therapy. She is tolerating anastrozole extremely well. She denies any hot flashes or myalgias. She does have osteopenia for which she is on Fosamax and calcium and vitamin D. She does not complain of any problems related to Fosamax.  REVIEW OF SYSTEMS:   Constitutional: Denies fevers, chills or abnormal weight loss Eyes: Denies blurriness of vision Ears, nose, mouth, throat, and face: Denies mucositis or sore throat Respiratory: Denies cough, dyspnea or wheezes Cardiovascular: Denies palpitation, chest discomfort Gastrointestinal:  Denies nausea, heartburn or change in bowel habits Skin: Denies abnormal skin rashes Lymphatics: Denies new lymphadenopathy or easy bruising Neurological:Denies numbness, tingling or new weaknesses Behavioral/Psych: Mood is stable, no new changes  Extremities: No lower extremity edema Breast:  denies any pain or lumps or nodules in either breasts All other systems were reviewed with the patient and are negative.  I have reviewed the past medical history, past surgical history, social history and family history with the patient and they are unchanged from previous note.  ALLERGIES:  is allergic to latex; clindamycin/lincomycin; peanut-containing drug products; sulfa antibiotics; and iodine.  MEDICATIONS:  Current Outpatient Prescriptions  Medication Sig Dispense Refill  . alendronate (FOSAMAX) 70 MG tablet Take 1 tablet (70 mg total) by mouth once a week. Take with a full glass of water on an empty  stomach. (Patient taking differently: Take 70 mg by mouth once a week. Monday. Take with a full glass of water on an empty stomach.) 12 tablet 3  . anastrozole (ARIMIDEX) 1 MG tablet Take 1 tablet (1 mg total) by mouth daily. 90 tablet 3  . DYMISTA 137-50 MCG/ACT SUSP Place 2 sprays into both nostrils at bedtime. Reported on  09/09/2015    . lamoTRIgine (LAMICTAL) 200 MG tablet Take 200 mg by mouth at bedtime.     . Multiple Vitamin (MULTIVITAMIN WITH MINERALS) TABS tablet Take 1 tablet by mouth daily.    Marland Kitchen omeprazole (PRILOSEC OTC) 20 MG tablet Take 20 mg by mouth daily.    Marland Kitchen omeprazole-sodium bicarbonate (ZEGERID) 40-1100 MG per capsule Take 1 capsule by mouth at bedtime.  3   No current facility-administered medications for this visit.     PHYSICAL EXAMINATION: ECOG PERFORMANCE STATUS: 1 - Symptomatic but completely ambulatory  Vitals:   06/01/16 1047  BP: (!) 146/80  Pulse: 73  Resp: 18  Temp: 98.2 F (36.8 C)   Filed Weights   06/01/16 1047  Weight: 141 lb 4.8 oz (64.1 kg)    GENERAL:alert, no distress and comfortable SKIN: skin color, texture, turgor are normal, no rashes or significant lesions EYES: normal, Conjunctiva are pink and non-injected, sclera clear OROPHARYNX:no exudate, no erythema and lips, buccal mucosa, and tongue normal  NECK: supple, thyroid normal size, non-tender, without nodularity LYMPH:  no palpable lymphadenopathy in the cervical, axillary or inguinal LUNGS: clear to auscultation and percussion with normal breathing effort HEART: regular rate & rhythm and no murmurs and no lower extremity edema ABDOMEN:abdomen soft, non-tender and normal bowel sounds MUSCULOSKELETAL:no cyanosis of digits and no clubbing  NEURO: alert & oriented x 3 with fluent speech, no focal motor/sensory deficits EXTREMITIES: No lower extremity edema BREAST: No palpable masses or nodules in either right or left breasts. No palpable axillary supraclavicular or infraclavicular adenopathy no breast tenderness or nipple discharge. (exam performed in the presence of a chaperone)  LABORATORY DATA:  I have reviewed the data as listed   Chemistry      Component Value Date/Time   NA 140 03/02/2016 1431   NA 142 03/19/2015 0847   K 4.2 03/02/2016 1431   K 3.7 03/19/2015 0847   CL 103 03/02/2016 1431    CO2 30 03/02/2016 1431   CO2 30 (H) 03/19/2015 0847   BUN 11 03/02/2016 1431   BUN 10.4 03/19/2015 0847   CREATININE 0.71 03/02/2016 1431   CREATININE 0.8 03/19/2015 0847      Component Value Date/Time   CALCIUM 9.2 03/02/2016 1431   CALCIUM 9.4 03/19/2015 0847   ALKPHOS 78 03/19/2015 0847   AST 18 03/19/2015 0847   ALT 16 03/19/2015 0847   BILITOT 0.64 03/19/2015 0847       Lab Results  Component Value Date   WBC 5.2 03/02/2016   HGB 13.5 03/02/2016   HCT 42.0 03/02/2016   MCV 94.8 03/02/2016   PLT 197 03/02/2016   NEUTROABS 9.3 (H) 03/29/2015    ASSESSMENT & PLAN:  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 female breast Rt Lumpectomy 03/26/15: 1.8 cm IDC grade 1, 0/6 LN negative, Er 100%, PR 70%, Her 2 Neg, Ki 67: 5% T1cN0 Stage 1A Oncotype DX score 16, 10% ROR Radiation therapy started 05/25/2015 discontinued after 12 fractions because of nonhealing wound infection  Current treatment: Adjuvant antiestrogen therapy with anastrozole 1 mg daily 5 years started 08/26/2015 Anastrozole toxicities:  High  bleeding risk/poor wound healing: Breast wound finally closed with multiple rounds of antibiotics and wound care Severe osteopenia T score -2.4: Currently on alendronate with calcium and vitamin D Breast Cancer Surveillance: 1. Breast exam 06/01/2016: Normal 2. Mammogram and ultrasound 04/06/2016  Postsurgical changes. Breast Density Category C. I recommended that she get 3-D mammograms for surveillance. Discussed the differences between different breast density categories.  Return to clinic in 6 months for follow up after that we can see her annually.   Orders Placed This Encounter  Procedures  . CBC with Differential    Standing Status:   Future    Standing Expiration Date:   06/01/2017  . Comprehensive metabolic panel    Standing Status:   Future    Standing Expiration Date:   06/01/2017  . TSH    Standing Status:   Future    Standing Expiration Date:    06/01/2017  . CBC with Differential    Standing Status:   Future    Standing Expiration Date:   06/01/2017  . Comprehensive metabolic panel    Standing Status:   Future    Standing Expiration Date:   06/01/2017  . Lipid panel    Standing Status:   Future    Standing Expiration Date:   06/01/2017  . TSH    Standing Status:   Future    Standing Expiration Date:   06/01/2017   The patient has a good understanding of the overall plan. she agrees with it. she will call with any problems that may develop before the next visit here.   Rulon Eisenmenger, MD 06/01/16

## 2016-06-02 LAB — LIPID PANEL
CHOLESTEROL TOTAL: 268 mg/dL — AB (ref 100–199)
Chol/HDL Ratio: 3.4 ratio units (ref 0.0–4.4)
HDL: 78 mg/dL (ref >39)
LDL Calculated: 174 mg/dL — ABNORMAL HIGH (ref 0–99)
Triglycerides: 80 mg/dL (ref 0–149)
VLDL Cholesterol Cal: 16 mg/dL (ref 5–40)

## 2016-06-03 ENCOUNTER — Ambulatory Visit: Payer: BC Managed Care – PPO | Admitting: Physical Therapy

## 2016-06-03 DIAGNOSIS — M545 Low back pain, unspecified: Secondary | ICD-10-CM

## 2016-06-03 DIAGNOSIS — M6281 Muscle weakness (generalized): Secondary | ICD-10-CM

## 2016-06-03 NOTE — Patient Instructions (Signed)
    PIlates Health and safety inspector)    Raeford Razor    [phone] [email or web address]       If tape helps, consider a Cho-Pat strap  (skinny strap that goes below the kneecap)   Private sudio:  PT Pilates    [name] PT [facility] [address] [place], [place] [address] Phone # [phone] Fax [phone]

## 2016-06-03 NOTE — Therapy (Signed)
[facility] [address], [place], [place], [address] Phone: [phone]   Fax:  [phone]  Physical Therapy Treatment/Discharge Summary  Patient Details  Name: Candace Campbell MRN: [id number] Date of Birth: May 13, 1956 Referring Provider: Jovita Gamma, MD  Encounter Date: 06/03/2016      PT End of Session - 06/03/16 1031    Visit Number 14   Date for PT Re-Evaluation 06/06/16   PT Start Time 0935   PT Stop Time 1020   PT Time Calculation (min) 45 min   Activity Tolerance Patient tolerated treatment well      Past Medical History:  Diagnosis Date  . Anxiety   . Blood dyscrasia    states daughter has Von Willebrand and she was tested 03-19-15 by Dr Lindi Adie  . Breast cancer ([place]) 03/13/15   right breast   . Depression   . GERD (gastroesophageal reflux disease)   . Headache    HX MIGRAINES  . History of kidney stones   . PONV (postoperative nausea and vomiting)     Past Surgical History:  Procedure Laterality Date  . ABDOMINAL HYSTERECTOMY    . BREAST LUMPECTOMY Left   . BREAST LUMPECTOMY WITH RADIOACTIVE SEED AND SENTINEL LYMPH NODE BIOPSY Right 03/26/2015   Procedure: BREAST LUMPECTOMY WITH RADIOACTIVE SEED AND SENTINEL LYMPH NODE BIOPSY;  Surgeon: [name], MD;  Location: [facility];  Service: General;  Laterality: Right;  . BUNIONECTOMY     BILAT  . LUMBAR LAMINECTOMY/DECOMPRESSION MICRODISCECTOMY Right 03/03/2016   Procedure: RIGHT LUMBAR FOUR-FIVE LUMBAR LAMINECTOMYAND MICRODISCECTOMY;  Surgeon: Jovita Gamma, MD;  Location: [facility];  Service: Neurosurgery;  Laterality: Right;  right    There were no vitals filed for this visit.      Subjective Assessment - 06/03/16 0935    Subjective I think I'm doing pretty well.  I'm afraid to go back to yoga yet.  Overall improvement at 75% better.  "I'm so glad I came to therapy."     Currently in Pain? Yes   Pain Score 2    Pain  Location Back   Pain Orientation Lower   Multiple Pain Sites Yes   Pain Score 2   Pain Location Knee   Pain Orientation Right            [facility] PT Assessment - 06/03/16 0001      Observation/Other Assessments   Focus on Therapeutic Outcomes (FOTO)  33% limitation      Strength   Overall Strength Abdominal and trunk extension strength 4/5 Great toe flexion/extension and abduction 4/5 strength   Right Hip Flexion 5/5   Right Hip ABduction 4+/5   Right Knee Flexion 5/5   Right Knee Extension 5/5   Right Ankle Dorsiflexion 5/5   Right Ankle Plantar Flexion 5/5                     [facility] Adult PT Treatment/Exercise - 06/03/16 0001      Therapeutic Activites    Therapeutic Activities Other Therapeutic Activities   Lifting golfer's lift and squat lifting with head/chest up    Other Therapeutic Activities kneeling for gardening simulation      Lumbar Exercises: Aerobic   Elliptical L1 x 8 minutes     Lumbar Exercises: Seated   Other Seated Lumbar Exercises discussion on right great toe and toe intrinsic strengthening     Lumbar Exercises: Supine   Ab Set 5 reps  Bent Knee Raise 10 reps   Isometric Hip Flexion 10 reps     Lumbar Exercises: Sidelying   Hip Abduction 10 reps     Lumbar Exercises: Quadruped   Opposite Arm/Leg Raise Right arm/Left leg;Left arm/Right leg;5 reps     Manual Therapy   Manual Therapy Taping   McConnell right patellar tendon taping;  Min improvement with going up and down stairs                PT Education - 06/03/16 1030    Education provided Yes   Education Details lifting methods; core strength HEP focus;  toe intrinsic strengthening;    info on Pilates community class;  knee support info   Person(s) Educated Patient   Methods Explanation;Demonstration;Handout   Comprehension Verbalized understanding;Returned demonstration          PT Short Term Goals - 06/03/16 1015      PT SHORT TERM GOAL #1   Title Pt  will be independent in her home excercise program   Status Achieved     PT SHORT TERM GOAL #2   Title Pt will report pain of </= 3/10 with functional ADLs   Status Achieved           PT Long Term Goals - 06/03/16 1003      PT LONG TERM GOAL #1   Title Pt will be independent with HEP progression.   Status Achieved     PT LONG TERM GOAL #2   Title Pt will improve FOTO score from 46 percent limitation to </= 36 percent limitation in order to improve functional mobility.   Status Achieved     PT LONG TERM GOAL #3   Title Pt will improve right LE Hip flexion, abduction and extension to 5/5 in order to improve gait and functional transfers.   Status Partially Met     PT LONG TERM GOAL #4   Title Pt will be able to pick object up off the floor using correct body mechanics with no pain reported.   Status Achieved               Plan - 06/03/16 1032    Clinical Impression Statement The patient has made excellent progress with PT.  Improved core muscle activation which is much needed with her long term history of hypermobility.  She has some continued weakness in abdominals, lumbar multifidi and gluteals (on right only) but she has been instructed in a comprehensive HEP to address these deficits.  Her right foot motor weakness has improved significantly with only slight deficits with great toe strength.  She has some anterior right knee pain which should improve with compliance with her HEP.  She does not feel ready to return to her yoga class yet and we discussed community options including a beginner Pilates class available.  She has met the majority of rehab goals and she expresses readiness for discharge from PT at this time.        Patient will benefit from skilled therapeutic intervention in order to improve the following deficits and impairments:     Visit Diagnosis: Acute midline low back pain without sciatica  Muscle weakness (generalized)    PHYSICAL THERAPY  DISCHARGE SUMMARY  Visits from Start of Care: 14  Current functional level related to goals / functional outcomes: See clinical impressions above   Remaining deficits: As above   Education / Equipment: Comprehensive HEP Plan: Patient agrees to discharge.  Patient goals were met.  Patient is being discharged due to meeting the stated rehab goals.  ?????         Problem List Patient Active Problem List   Diagnosis Date Noted  . HNP (herniated nucleus pulposus), lumbar 03/03/2016  . At high risk for bleeding 03/19/2015  . 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 female breast ([place]) 03/17/2015   [name], PT 06/03/16 10:40 AM Phone: [phone] Fax: [phone]  [name] 06/03/2016, 10:37 AM  [facility] [address] [place], [place], [address] Phone: [phone]   Fax:  [phone]  Name: TIAUNNA BUFORD MRN: [id number] Date of Birth: 15-Mar-1956

## 2016-07-08 ENCOUNTER — Telehealth: Payer: Self-pay | Admitting: Hematology and Oncology

## 2016-07-08 NOTE — Telephone Encounter (Signed)
FAXED [facility] RELEASE ID [id number]

## 2016-07-13 ENCOUNTER — Other Ambulatory Visit: Payer: Self-pay | Admitting: Hematology and Oncology

## 2016-07-13 DIAGNOSIS — C50311 Malignant neoplasm of lower-inner quadrant of right female breast: Secondary | ICD-10-CM

## 2016-08-29 ENCOUNTER — Other Ambulatory Visit: Payer: Self-pay | Admitting: Hematology and Oncology

## 2016-08-29 DIAGNOSIS — C50311 Malignant neoplasm of lower-inner quadrant of right female breast: Secondary | ICD-10-CM

## 2016-10-18 ENCOUNTER — Other Ambulatory Visit: Payer: Self-pay | Admitting: Hematology and Oncology

## 2016-10-18 DIAGNOSIS — C50311 Malignant neoplasm of lower-inner quadrant of right female breast: Secondary | ICD-10-CM

## 2016-11-23 ENCOUNTER — Other Ambulatory Visit: Payer: Self-pay | Admitting: Hematology and Oncology

## 2016-11-23 DIAGNOSIS — C50311 Malignant neoplasm of lower-inner quadrant of right female breast: Secondary | ICD-10-CM

## 2016-12-11 IMAGING — MG 2D DIGITAL DIAGNOSTIC BILATERAL MAMMOGRAM WITH CAD AND ADJUNCT T
8 of 13 series · 8 of 29 positions shown · non-contrast
Comparison: Prior studies including 10/06/2015

CLINICAL DATA: History of right lumpectomy with radiation therapy
March 2015. Patient had hematoma and infection at the lumpectomy
site.

EXAM:
2D DIGITAL DIAGNOSTIC BILATERAL MAMMOGRAM WITH CAD AND ADJUNCT TOMO
ULTRASOUND RIGHT BREAST

[R MLO (1 of 2)]
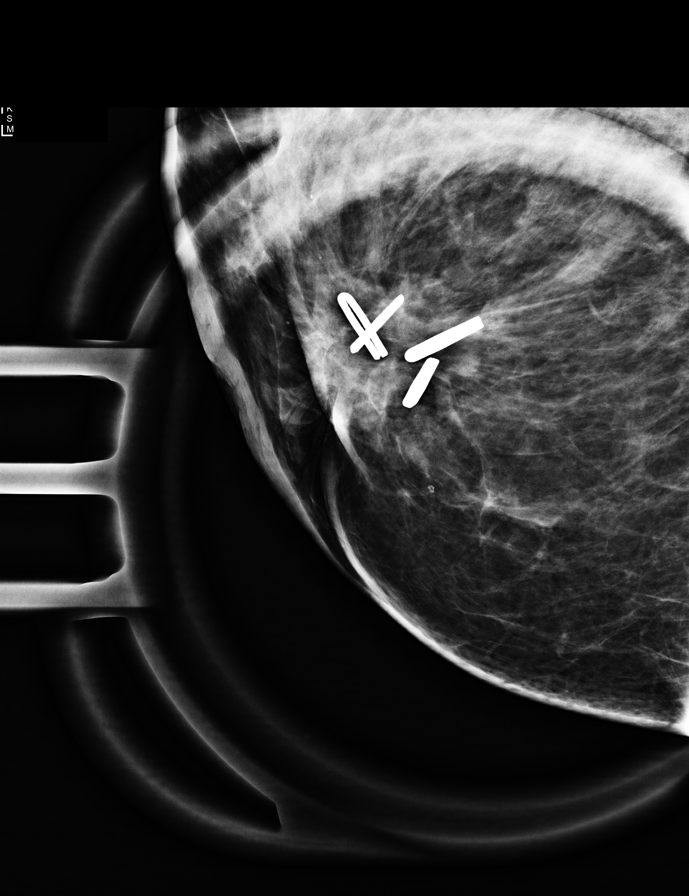

[R MLO (2 of 2)]
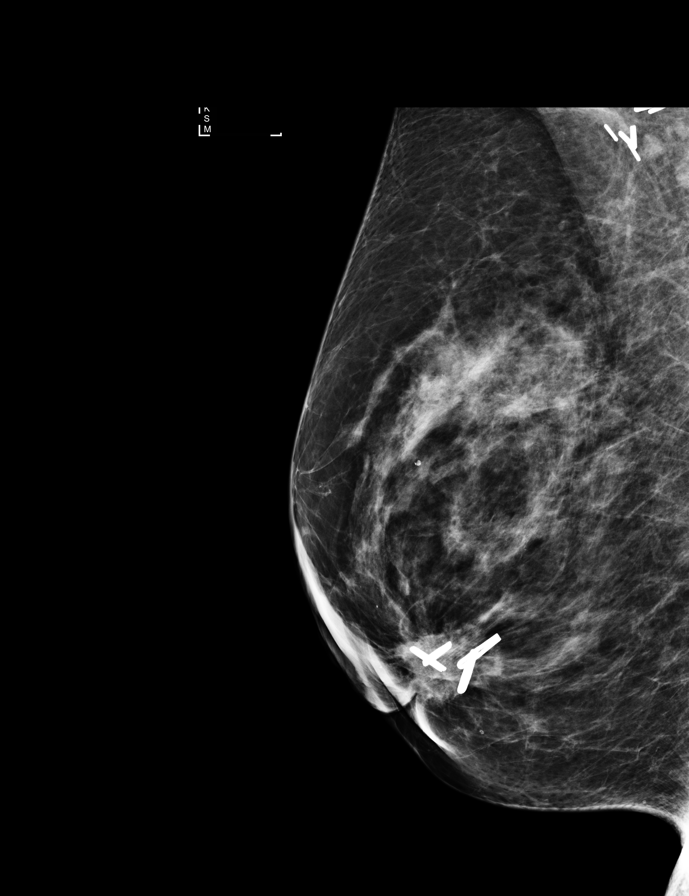

[L MLO synth-2D]
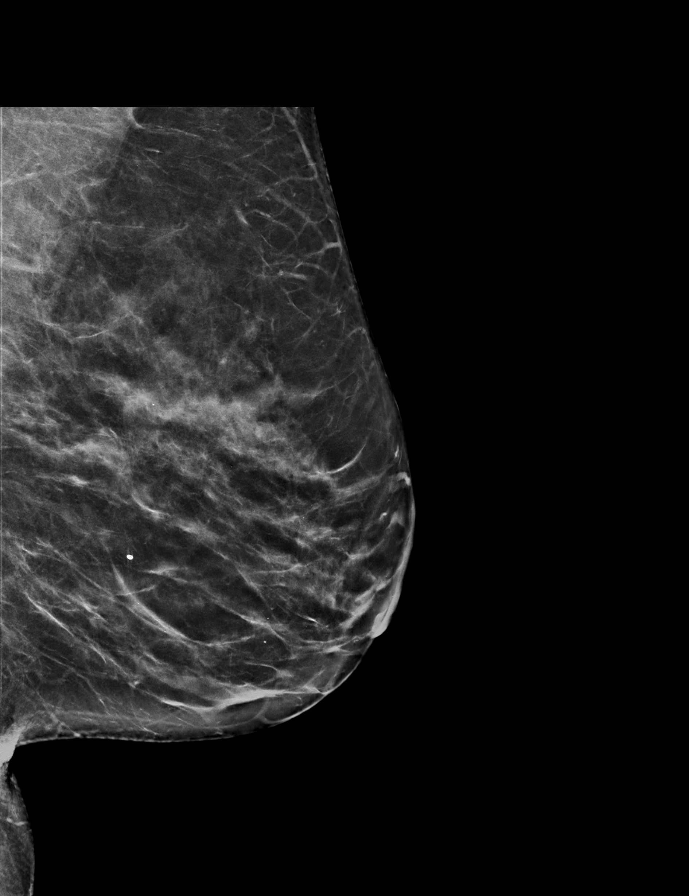

[R MLO synth-2D]
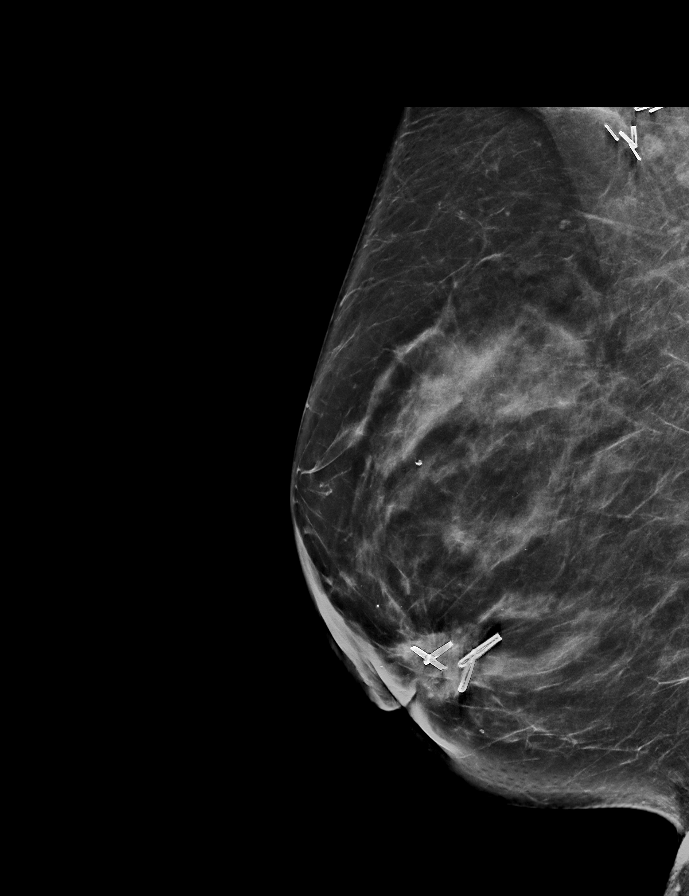

[L CC synth-2D]
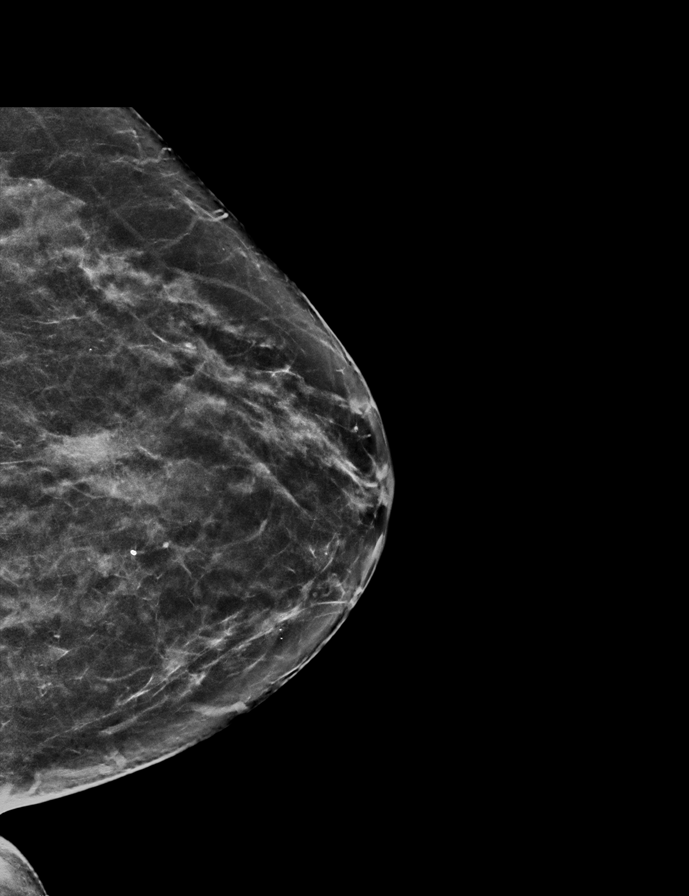

[R CC]
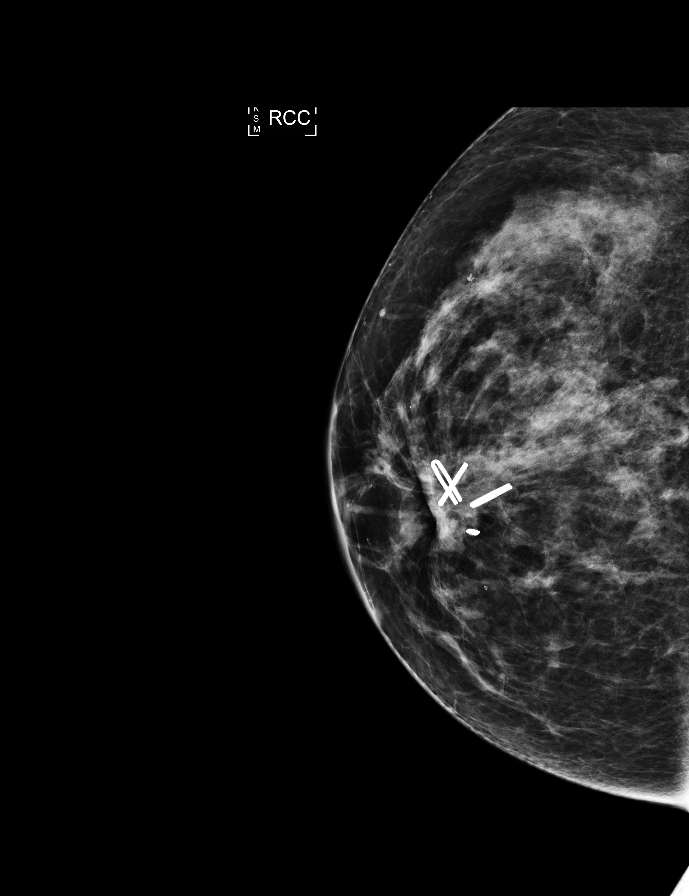

[L CC]
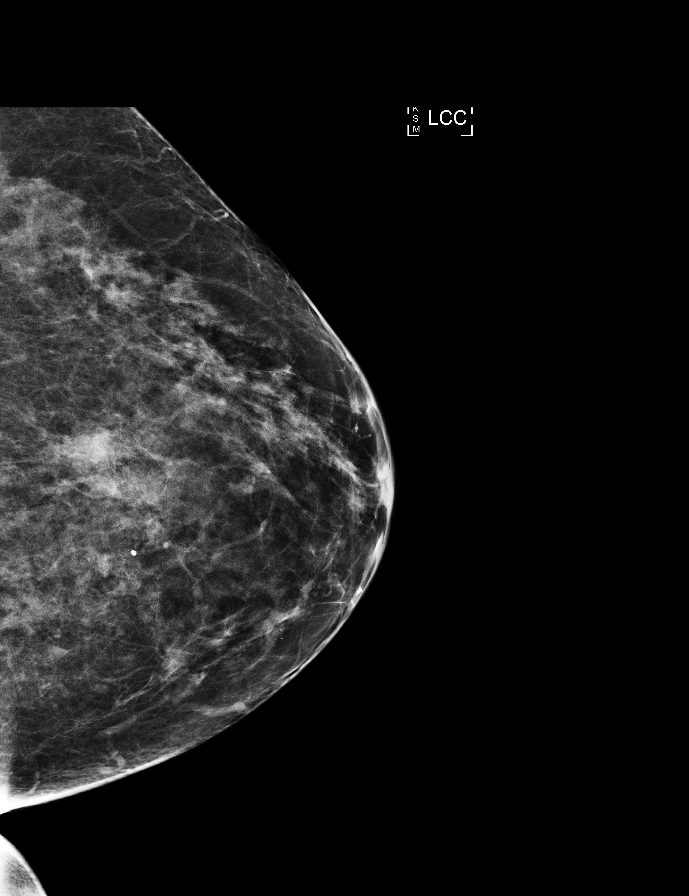

[L MLO]
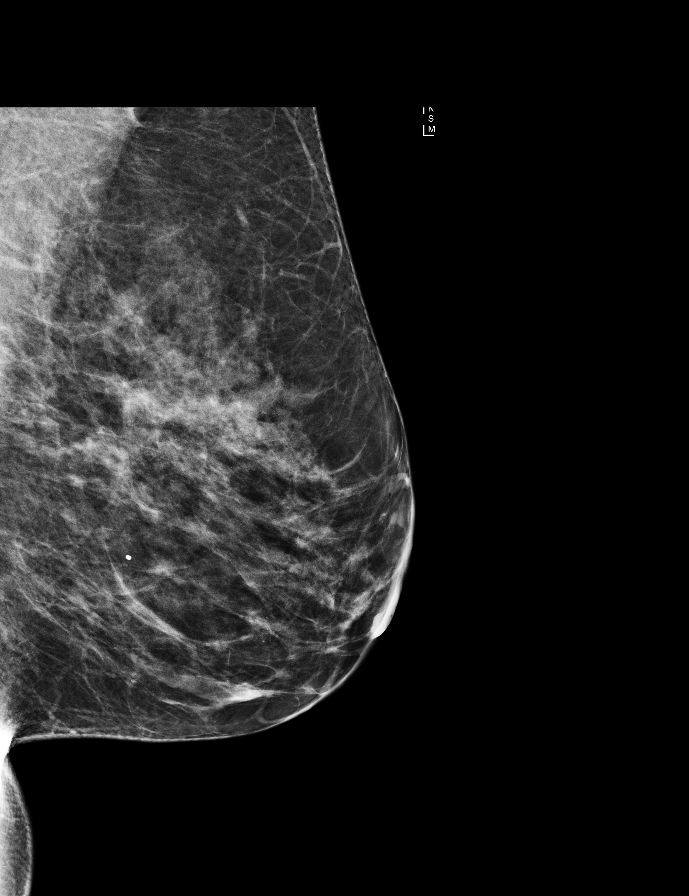

[8 of 29 positions shown; findings below may reference images not displayed]

ACR Breast Density Category c: The breast tissue is heterogeneously
dense, which may obscure small masses.
FINDINGS: Post operative changes are seen in the rightbreast. Distortion in
density are identified at the lumpectomy site. This is further
evaluated by ultrasound. The left breast is negative.

Mammographic images were processed with CAD.

On physical exam, there is a well-healed lumpectomy scar in the 6
o'clock location of the right breast. There is no associated
erythema or palpable mass.

Targeted ultrasound is performed, showing expected lumpectomy
changes in the 6 o'clock location of the right breast. No suspicious
mass identified.
IMPRESSION: 1. Expected treatment changes in the right breast.
2.  No mammographic or ultrasound evidence for malignancy.

RECOMMENDATION:
Diagnostic mammogram is suggested in 1 year. (Code:[id number])

I have discussed the findings and recommendations with the patient.
Results were also provided in writing at the conclusion of the
visit. If applicable, a reminder letter will be sent to the patient
regarding the next appointment.

BI-RADS CATEGORY  2: Benign.

## 2017-01-04 ENCOUNTER — Other Ambulatory Visit: Payer: Self-pay | Admitting: Hematology and Oncology

## 2017-01-04 DIAGNOSIS — C50311 Malignant neoplasm of lower-inner quadrant of right female breast: Secondary | ICD-10-CM

## 2017-02-06 ENCOUNTER — Telehealth: Payer: Self-pay

## 2017-02-06 NOTE — Telephone Encounter (Signed)
Called and left a message with new appt due to pal day  Comfort Iversen

## 2017-03-01 ENCOUNTER — Other Ambulatory Visit: Payer: Self-pay | Admitting: Hematology and Oncology

## 2017-03-01 DIAGNOSIS — C50311 Malignant neoplasm of lower-inner quadrant of right female breast: Secondary | ICD-10-CM

## 2017-03-15 ENCOUNTER — Other Ambulatory Visit: Payer: Self-pay | Admitting: Hematology and Oncology

## 2017-03-15 DIAGNOSIS — C50311 Malignant neoplasm of lower-inner quadrant of right female breast: Secondary | ICD-10-CM

## 2017-03-16 ENCOUNTER — Other Ambulatory Visit: Payer: Self-pay | Admitting: General Surgery

## 2017-03-16 DIAGNOSIS — Z853 Personal history of malignant neoplasm of breast: Secondary | ICD-10-CM

## 2017-04-08 ENCOUNTER — Ambulatory Visit
Admission: RE | Admit: 2017-04-08 | Discharge: 2017-04-08 | Disposition: A | Discharge status: Home / Self Care | Payer: BC Managed Care – PPO | Source: Ambulatory Visit | Attending: General Surgery | Admitting: General Surgery

## 2017-04-08 DIAGNOSIS — Z853 Personal history of malignant neoplasm of breast: Secondary | ICD-10-CM

## 2017-04-08 HISTORY — DX: Personal history of irradiation: Z92.3

## 2017-05-31 ENCOUNTER — Other Ambulatory Visit: Payer: BC Managed Care – PPO

## 2017-05-31 ENCOUNTER — Ambulatory Visit: Payer: BC Managed Care – PPO | Admitting: Hematology and Oncology

## 2017-06-02 ENCOUNTER — Other Ambulatory Visit: Payer: BC Managed Care – PPO

## 2017-06-02 ENCOUNTER — Ambulatory Visit: Payer: BC Managed Care – PPO | Admitting: Hematology and Oncology

## 2017-06-02 ENCOUNTER — Other Ambulatory Visit: Payer: Self-pay | Admitting: *Deleted

## 2017-06-02 DIAGNOSIS — C50311 Malignant neoplasm of lower-inner quadrant of right female breast: Secondary | ICD-10-CM

## 2017-06-03 ENCOUNTER — Other Ambulatory Visit (HOSPITAL_BASED_OUTPATIENT_CLINIC_OR_DEPARTMENT_OTHER): Payer: BC Managed Care – PPO

## 2017-06-03 ENCOUNTER — Ambulatory Visit: Payer: BC Managed Care – PPO | Admitting: Hematology and Oncology

## 2017-06-03 ENCOUNTER — Telehealth: Payer: Self-pay | Admitting: Hematology and Oncology

## 2017-06-03 ENCOUNTER — Telehealth: Payer: Self-pay

## 2017-06-03 VITALS — BP 140/85 | HR 63 | Temp 98.5°F | Resp 20 | Ht 64.75 in | Wt 129.0 lb

## 2017-06-03 DIAGNOSIS — C50311 Malignant neoplasm of lower-inner quadrant of right female breast: Secondary | ICD-10-CM

## 2017-06-03 DIAGNOSIS — Z79811 Long term (current) use of aromatase inhibitors: Secondary | ICD-10-CM

## 2017-06-03 DIAGNOSIS — Z923 Personal history of irradiation: Secondary | ICD-10-CM | POA: Diagnosis not present

## 2017-06-03 DIAGNOSIS — M8580 Other specified disorders of bone density and structure, unspecified site: Secondary | ICD-10-CM

## 2017-06-03 DIAGNOSIS — Z13220 Encounter for screening for lipoid disorders: Secondary | ICD-10-CM

## 2017-06-03 LAB — DRAW EXTRA CLOT TUBE

## 2017-06-03 LAB — COMPREHENSIVE METABOLIC PANEL
ALBUMIN: 4.3 g/dL (ref 3.5–5.0)
ALK PHOS: 47 U/L (ref 40–150)
ALT: 18 U/L (ref 0–55)
ANION GAP: 7 meq/L (ref 3–11)
AST: 19 U/L (ref 5–34)
BUN: 13.5 mg/dL (ref 7.0–26.0)
CALCIUM: 9.2 mg/dL (ref 8.4–10.4)
CHLORIDE: 103 meq/L (ref 98–109)
CO2: 29 mEq/L (ref 22–29)
Creatinine: 0.9 mg/dL (ref 0.6–1.1)
EGFR: >60 mL/min/{1.73_m2} (ref >60)
Glucose: 92 mg/dl (ref 70–140)
POTASSIUM: 4.5 meq/L (ref 3.5–5.1)
Sodium: 140 mEq/L (ref 136–145)
Total Bilirubin: 0.64 mg/dL (ref 0.20–1.20)
Total Protein: 6.7 g/dL (ref 6.4–8.3)

## 2017-06-03 LAB — CBC WITH DIFFERENTIAL/PLATELET
BASO%: 0.3 % (ref 0.0–2.0)
Basophils Absolute: 0 10*3/uL (ref 0.0–0.1)
EOS%: 1.3 % (ref 0.0–7.0)
Eosinophils Absolute: 0.1 10*3/uL (ref 0.0–0.5)
HEMATOCRIT: 41.8 % (ref 34.8–46.6)
HGB: 13.4 g/dL (ref 11.6–15.9)
LYMPH#: 1.1 10*3/uL (ref 0.9–3.3)
LYMPH%: 28 % (ref 14.0–49.7)
MCH: 30.5 pg (ref 25.1–34.0)
MCHC: 32.1 g/dL (ref 31.5–36.0)
MCV: 95.2 fL (ref 79.5–101.0)
MONO#: 0.4 10*3/uL (ref 0.1–0.9)
MONO%: 9.3 % (ref 0.0–14.0)
NEUT%: 61.1 % (ref 38.4–76.8)
NEUTROS ABS: 2.4 10*3/uL (ref 1.5–6.5)
Platelets: 203 10*3/uL (ref 145–400)
RBC: 4.39 10*6/uL (ref 3.70–5.45)
RDW: 13.2 % (ref 11.2–14.5)
WBC: 4 10*3/uL (ref 3.9–10.3)

## 2017-06-03 NOTE — Telephone Encounter (Signed)
Gave patient AVS. Patient declined calendar of upcoming appointments. Patient scheduled per 11/30 los.

## 2017-06-03 NOTE — Assessment & Plan Note (Signed)
Rt Lumpectomy 03/26/15: 1.8 cm IDC grade 1, 0/6 LN negative, Er 100%, PR 70%, Her 2 Neg, Ki 67: 5% T1cN0 Stage 1A Oncotype DX score 16, 10% ROR Radiation therapy started 05/25/2015 discontinued after 12 fractions because of nonhealing wound infection  Current treatment: Adjuvant antiestrogen therapy with anastrozole 1 mg daily 5 years started 08/26/2015  High bleeding risk/poor wound healing: Breast wound finally closed with multiple rounds of antibiotics and wound care Severe osteopenia T score -2.4: Currently on alendronate with calcium and vitamin D  Breast cancer surveillance: 1.  Breast exam 06/03/2017: Benign 2. mammogram 04/08/2017: No evidence of malignancy, breast density category C  Return to clinic in 1 year for follow up

## 2017-06-03 NOTE — Progress Notes (Signed)
 Patient Care Team: Gates, Donna, MD as PCP - General (Family Medicine) Varnado, Evelyn, MD as Consulting Physician (Obstetrics and Gynecology) Byerly, Faera, MD as Consulting Physician (General Surgery) Gudena, Vinay, MD as Consulting Physician (Hematology and Oncology) Wentworth, Stacy, MD (Inactive) as Consulting Physician (Radiation Oncology) Wentworth, Stacy, MD (Inactive) as Consulting Physician (Radiation Oncology) Stuart, Dawn C, RN as Registered Nurse Martini, Keisha N, RN as Registered Nurse Mackey, Heather Thompson, NP as Nurse Practitioner (Nurse Practitioner)   DIAGNOSIS:  Encounter Diagnoses  Name Primary?  . Malignant neoplasm of lower-inner quadrant of right female breast, unspecified estrogen receptor status (H[facility])   . Screening cholesterol level Yes    SUMMARY OF ONCOLOGIC HISTORY:   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 female breast ([facility])   03/04/2015 Mammogram    Right breast possible distortion, ultrasound 03/11/2015 suspicious mass right breast 6:00 irregular hypoechoic mass 0.7 x 0.6 x 0.6 cm, adjacent to this innumerable cysts and clusters of cysts measuring 0.7 cm      03/13/2015 Initial Biopsy    Right breast biopsy: Invasive ductal carcinoma grade 1, ER+ (100%), PR+ (70%),  HER-2 negative (ratio 1.44),  Ki-67 5%      03/13/2015 Clinical Stage    Stage IA: T1b N0      03/26/2015 Surgery    Rt Lumpectomy: 1.8 cm IDC grade 1, 0/6 LN negative, ER+ 100%, PR+ 70%, Her 2 Neg (ratio 1.35), Ki 67: 5%      03/26/2015 Pathologic Stage    Stage IA: pT1c pN0      03/26/2015 Oncotype testing    RS 16 (10% ROR)      05/21/2015 - 06/09/2015 Radiation Therapy    Adjuvant RT to right breast: 32.04 Gy over 12 fractions; treatment stopped because of wound infection      08/26/2015 -  Anti-estrogen oral therapy    Anastrozole 1 mg daily      09/09/2015 Survivorship    Survivorship visit completed and copy of care plan given to patient       CHIEF  COMPLIANT: Follow-up on anastrozole therapy  INTERVAL HISTORY: Candace Campbell is a 61-year-old with above-mentioned history of right breast cancer treated with lumpectomy and radiation is currently on anastrozole.  She appears to be tolerating it fairly well.  She has intermittent hot flashes.  She had severe osteopenia which has improved with alendronate but she does not want to take it anymore because of acid reflux.  She would like to switch to Prolia.  REVIEW OF SYSTEMS:   Constitutional: Denies fevers, chills or abnormal weight loss Eyes: Denies blurriness of vision Ears, nose, mouth, throat, and face: Denies mucositis or sore throat Respiratory: Denies cough, dyspnea or wheezes Cardiovascular: Denies palpitation, chest discomfort Gastrointestinal:  Denies nausea, heartburn or change in bowel habits Skin: Denies abnormal skin rashes Lymphatics: Denies new lymphadenopathy or easy bruising Neurological:Denies numbness, tingling or new weaknesses Behavioral/Psych: Mood is stable, no new changes  Extremities: No lower extremity edema Breast:  denies any pain or lumps or nodules in either breasts All other systems were reviewed with the patient and are negative.  I have reviewed the past medical history, past surgical history, social history and family history with the patient and they are unchanged from previous note.  ALLERGIES:  is allergic to latex; clindamycin/lincomycin; peanut-containing drug products; sulfa antibiotics; and iodine.  MEDICATIONS:  Current Outpatient Medications  Medication Sig Dispense Refill  . anastrozole (ARIMIDEX) 1 MG tablet TAKE 1 TABLET BY MOUTH DAILY   90 tablet 0  . lamoTRIgine (LAMICTAL) 200 MG tablet Take 200 mg by mouth at bedtime.     . Multiple Vitamin (MULTIVITAMIN WITH MINERALS) TABS tablet Take 1 tablet by mouth daily.    Marland Kitchen omeprazole-sodium bicarbonate (ZEGERID) 40-1100 MG per capsule Take 1 capsule by mouth at bedtime.  3   No current  facility-administered medications for this visit.     PHYSICAL EXAMINATION: ECOG PERFORMANCE STATUS: 1 - Symptomatic but completely ambulatory  Vitals:   06/03/17 1010  BP: 140/85  Pulse: 63  Resp: 20  Temp: 98.5 F (36.9 C)  SpO2: 98%   Filed Weights   06/03/17 1010  Weight: 129 lb (58.5 kg)    GENERAL:alert, no distress and comfortable SKIN: skin color, texture, turgor are normal, no rashes or significant lesions EYES: normal, Conjunctiva are pink and non-injected, sclera clear OROPHARYNX:no exudate, no erythema and lips, buccal mucosa, and tongue normal  NECK: supple, thyroid normal size, non-tender, without nodularity LYMPH:  no palpable lymphadenopathy in the cervical, axillary or inguinal LUNGS: clear to auscultation and percussion with normal breathing effort HEART: regular rate & rhythm and no murmurs and no lower extremity edema ABDOMEN:abdomen soft, non-tender and normal bowel sounds MUSCULOSKELETAL:no cyanosis of digits and no clubbing  NEURO: alert & oriented x 3 with fluent speech, no focal motor/sensory deficits EXTREMITIES: No lower extremity edema BREAST: No palpable masses or nodules in either right or left breasts. No palpable axillary supraclavicular or infraclavicular adenopathy no breast tenderness or nipple discharge. (exam performed in the presence of a chaperone)  LABORATORY DATA:  I have reviewed the data as listed   Chemistry      Component Value Date/Time   NA 140 06/03/2017 0958   K 4.5 06/03/2017 0958   CL 103 03/02/2016 1431   CO2 29 06/03/2017 0958   BUN 13.5 06/03/2017 0958   CREATININE 0.9 06/03/2017 0958      Component Value Date/Time   CALCIUM 9.2 06/03/2017 0958   ALKPHOS 47 06/03/2017 0958   AST 19 06/03/2017 0958   ALT 18 06/03/2017 0958   BILITOT 0.64 06/03/2017 0958       Lab Results  Component Value Date   WBC 4.0 06/03/2017   HGB 13.4 06/03/2017   HCT 41.8 06/03/2017   MCV 95.2 06/03/2017   PLT 203 06/03/2017    NEUTROABS 2.4 06/03/2017    ASSESSMENT & PLAN:  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 female breast Rt Lumpectomy 03/26/15: 1.8 cm IDC grade 1, 0/6 LN negative, Er 100%, PR 70%, Her 2 Neg, Ki 67: 5% T1cN0 Stage 1A Oncotype DX score 16, 10% ROR Radiation therapy started 05/25/2015 discontinued after 12 fractions because of nonhealing wound infection  Current treatment: Adjuvant antiestrogen therapy with anastrozole 1 mg daily 5 years started 08/26/2015  High bleeding risk/poor wound healing: Healed completely Severe osteopenia T score -2.4: Currently on alendronate with calcium and vitamin D, we will switch her to Prolia because she does not want to take alendronate due to acid reflux.  Breast cancer surveillance: 1.  Breast exam 06/03/2017: Benign 2. mammogram 04/08/2017: No evidence of malignancy, breast density category C  Return to clinic in 1 year for follow up   I spent 25 minutes talking to the patient of which more than half was spent in counseling and coordination of care.  Orders Placed This Encounter  Procedures  . Lipid panel    Standing Status:   Future    Standing Expiration Date:  06/03/2018   The patient has a good understanding of the overall plan. she agrees with it. she will call with any problems that may develop before the next visit here.   Gudena, Vinay K, MD 06/03/17    

## 2017-06-03 NOTE — Telephone Encounter (Signed)
Error

## 2017-06-10 ENCOUNTER — Other Ambulatory Visit: Payer: Self-pay

## 2017-06-10 ENCOUNTER — Other Ambulatory Visit (HOSPITAL_BASED_OUTPATIENT_CLINIC_OR_DEPARTMENT_OTHER): Payer: BC Managed Care – PPO

## 2017-06-10 ENCOUNTER — Ambulatory Visit (HOSPITAL_BASED_OUTPATIENT_CLINIC_OR_DEPARTMENT_OTHER): Payer: BC Managed Care – PPO

## 2017-06-10 VITALS — BP 140/96 | HR 62 | Temp 97.0°F | Resp 12

## 2017-06-10 DIAGNOSIS — Z13220 Encounter for screening for lipoid disorders: Secondary | ICD-10-CM

## 2017-06-10 DIAGNOSIS — C50311 Malignant neoplasm of lower-inner quadrant of right female breast: Secondary | ICD-10-CM

## 2017-06-10 DIAGNOSIS — M8580 Other specified disorders of bone density and structure, unspecified site: Secondary | ICD-10-CM | POA: Diagnosis not present

## 2017-06-10 LAB — COMPREHENSIVE METABOLIC PANEL
ALBUMIN: 4.1 g/dL (ref 3.5–5.0)
ALK PHOS: 48 U/L (ref 40–150)
ALT: 12 U/L (ref 0–55)
AST: 15 U/L (ref 5–34)
Anion Gap: 9 mEq/L (ref 3–11)
BUN: 14.7 mg/dL (ref 7.0–26.0)
CO2: 28 meq/L (ref 22–29)
CREATININE: 0.8 mg/dL (ref 0.6–1.1)
Calcium: 9.1 mg/dL (ref 8.4–10.4)
Chloride: 103 mEq/L (ref 98–109)
EGFR: >60 mL/min/{1.73_m2} (ref >60)
GLUCOSE: 69 mg/dL — AB (ref 70–140)
Potassium: 4.2 mEq/L (ref 3.5–5.1)
SODIUM: 141 meq/L (ref 136–145)
TOTAL PROTEIN: 6.5 g/dL (ref 6.4–8.3)
Total Bilirubin: 0.44 mg/dL (ref 0.20–1.20)

## 2017-06-10 MED ORDER — DENOSUMAB 60 MG/ML ~~LOC~~ SOLN
60.0000 mg | Freq: Once | SUBCUTANEOUS | Status: AC
Start: 2017-06-10 — End: 2017-06-10
  Administered 2017-06-10: 60 mg via SUBCUTANEOUS

## 2017-06-14 ENCOUNTER — Other Ambulatory Visit: Payer: Self-pay | Admitting: Hematology and Oncology

## 2017-06-14 DIAGNOSIS — C50311 Malignant neoplasm of lower-inner quadrant of right female breast: Secondary | ICD-10-CM

## 2017-06-20 ENCOUNTER — Other Ambulatory Visit: Payer: Self-pay | Admitting: Hematology and Oncology

## 2017-06-20 ENCOUNTER — Other Ambulatory Visit: Payer: Self-pay

## 2017-06-20 ENCOUNTER — Telehealth: Payer: Self-pay

## 2017-06-20 DIAGNOSIS — C50311 Malignant neoplasm of lower-inner quadrant of right female breast: Secondary | ICD-10-CM

## 2017-06-20 MED ORDER — ANASTROZOLE 1 MG PO TABS: 1.0000 mg | ORAL_TABLET | Freq: Every day | ORAL | 0 refills | Status: DC

## 2017-06-20 NOTE — Telephone Encounter (Signed)
Returned pt call regarding pharmacy unable to refill Anastrozole. I Called pharmacy and they said script was ready for pt. Informed pt of this. No further questions.  Cyndia Bent RN

## 2017-07-27 ENCOUNTER — Telehealth: Payer: Self-pay | Admitting: *Deleted

## 2017-07-27 NOTE — Telephone Encounter (Addendum)
Fax came today from Dr Janice Norrie office for this patient. The patient saw Dr Radford Pax in 2010 but has not seen Dr Radford Pax since then. Patient is now needing to be referred for a sleep study either by Dr Toy Cookey or Dr Inda Merlin. Per Dr Radford Pax patient needs to be referred for a sleep study by her PCP. Copy of Fax from Dr Corky Sing office has been faxed to Dr Darcus Austin office at [phone] and a copy has been sent to our medical records departments.

## 2017-12-08 ENCOUNTER — Other Ambulatory Visit: Payer: Self-pay

## 2017-12-08 DIAGNOSIS — C50311 Malignant neoplasm of lower-inner quadrant of right female breast: Secondary | ICD-10-CM

## 2017-12-09 ENCOUNTER — Inpatient Hospital Stay: Payer: BC Managed Care – PPO

## 2017-12-09 ENCOUNTER — Inpatient Hospital Stay: Payer: BC Managed Care – PPO | Attending: Hematology and Oncology

## 2017-12-09 VITALS — BP 148/79 | HR 65 | Temp 98.3°F | Resp 16

## 2017-12-09 DIAGNOSIS — C50311 Malignant neoplasm of lower-inner quadrant of right female breast: Secondary | ICD-10-CM

## 2017-12-09 DIAGNOSIS — M8580 Other specified disorders of bone density and structure, unspecified site: Secondary | ICD-10-CM | POA: Diagnosis not present

## 2017-12-09 DIAGNOSIS — Z923 Personal history of irradiation: Secondary | ICD-10-CM | POA: Diagnosis not present

## 2017-12-09 DIAGNOSIS — Z79811 Long term (current) use of aromatase inhibitors: Secondary | ICD-10-CM | POA: Diagnosis not present

## 2017-12-09 DIAGNOSIS — Z170 Estrogen receptor positive status [ER+]: Secondary | ICD-10-CM | POA: Diagnosis not present

## 2017-12-09 LAB — CBC WITH DIFFERENTIAL (CANCER CENTER ONLY)
BASOS PCT: 1 %
Basophils Absolute: 0 10*3/uL (ref 0.0–0.1)
Eosinophils Absolute: 0.1 10*3/uL (ref 0.0–0.5)
Eosinophils Relative: 2 %
HEMATOCRIT: 39.5 % (ref 34.8–46.6)
Hemoglobin: 12.9 g/dL (ref 11.6–15.9)
LYMPHS ABS: 0.9 10*3/uL (ref 0.9–3.3)
Lymphocytes Relative: 26 %
MCH: 30.6 pg (ref 25.1–34.0)
MCHC: 32.8 g/dL (ref 31.5–36.0)
MCV: 93.5 fL (ref 79.5–101.0)
MONOS PCT: 9 %
Monocytes Absolute: 0.3 10*3/uL (ref 0.1–0.9)
NEUTROS PCT: 62 %
Neutro Abs: 2.3 10*3/uL (ref 1.5–6.5)
Platelet Count: 208 10*3/uL (ref 145–400)
RBC: 4.22 MIL/uL (ref 3.70–5.45)
RDW: 13.4 % (ref 11.2–14.5)
WBC Count: 3.6 10*3/uL — ABNORMAL LOW (ref 3.9–10.3)

## 2017-12-09 LAB — CMP (CANCER CENTER ONLY)
ALK PHOS: 43 U/L (ref 40–150)
ALT: 11 U/L (ref 0–55)
AST: 16 U/L (ref 5–34)
Albumin: 4.4 g/dL (ref 3.5–5.0)
Anion gap: 9 (ref 3–11)
BILIRUBIN TOTAL: 0.4 mg/dL (ref 0.2–1.2)
BUN: 12 mg/dL (ref 7–26)
CALCIUM: 9.1 mg/dL (ref 8.4–10.4)
CO2: 28 mmol/L (ref 22–29)
CREATININE: 0.84 mg/dL (ref 0.60–1.10)
Chloride: 104 mmol/L (ref 98–109)
Glucose, Bld: 83 mg/dL (ref 70–140)
Potassium: 4.1 mmol/L (ref 3.5–5.1)
SODIUM: 141 mmol/L (ref 136–145)
Total Protein: 6.7 g/dL (ref 6.4–8.3)

## 2017-12-09 LAB — LIPID PANEL
CHOL/HDL RATIO: 3.4 ratio
CHOLESTEROL: 218 mg/dL — AB (ref 0–200)
HDL: 65 mg/dL (ref >40)
LDL Cholesterol: 136 mg/dL — ABNORMAL HIGH (ref 0–99)
TRIGLYCERIDES: 84 mg/dL (ref <150)
VLDL: 17 mg/dL (ref 0–40)

## 2017-12-09 LAB — TSH: TSH: 0.794 u[IU]/mL (ref 0.308–3.960)

## 2017-12-09 MED ORDER — DENOSUMAB 60 MG/ML ~~LOC~~ SOLN
60.0000 mg | Freq: Once | SUBCUTANEOUS | Status: AC
  Administered 2017-12-09: 60 mg via SUBCUTANEOUS

## 2017-12-09 NOTE — Patient Instructions (Signed)

## 2017-12-10 ENCOUNTER — Other Ambulatory Visit: Payer: Self-pay | Admitting: Hematology and Oncology

## 2017-12-10 DIAGNOSIS — C50311 Malignant neoplasm of lower-inner quadrant of right female breast: Secondary | ICD-10-CM

## 2018-03-12 ENCOUNTER — Other Ambulatory Visit: Payer: Self-pay | Admitting: Hematology and Oncology

## 2018-03-12 DIAGNOSIS — C50311 Malignant neoplasm of lower-inner quadrant of right female breast: Secondary | ICD-10-CM

## 2018-04-04 ENCOUNTER — Encounter: Payer: Self-pay | Admitting: Hematology and Oncology

## 2018-04-04 NOTE — Progress Notes (Signed)
Rcvd an email from Ulice Dash stating pt called [facility] and expressed concern over her OOP on Prolia [phone]) and requested I reach out and determine if there are any pharmaceutical assistance that can be provided.  I called the pt and informed her of the [employer] [place] program.  Pt gave me consent to apply in her behalf so I applied to PAF and unfortunately she was denied because she exceeded their income requirement.  I will enroll her in the [place] program but they only have a 90 back date period so they will not cover the injection she received in June but will cover her future injections.  I advised she call the hospital and be set on a payment plan to help pay for the June injection.  She verbalized understanding and thanked me.

## 2018-04-24 ENCOUNTER — Other Ambulatory Visit: Payer: Self-pay | Admitting: General Surgery

## 2018-04-24 DIAGNOSIS — Z9889 Other specified postprocedural states: Secondary | ICD-10-CM

## 2018-05-01 ENCOUNTER — Other Ambulatory Visit: Payer: Self-pay | Admitting: General Surgery

## 2018-05-01 ENCOUNTER — Other Ambulatory Visit: Payer: Self-pay

## 2018-05-01 DIAGNOSIS — Z9889 Other specified postprocedural states: Secondary | ICD-10-CM

## 2018-05-03 ENCOUNTER — Ambulatory Visit
Admission: RE | Admit: 2018-05-03 | Discharge: 2018-05-03 | Disposition: A | Discharge status: Home / Self Care | Payer: BC Managed Care – PPO | Source: Ambulatory Visit | Attending: General Surgery | Admitting: General Surgery

## 2018-05-03 DIAGNOSIS — Z9889 Other specified postprocedural states: Secondary | ICD-10-CM

## 2018-06-08 ENCOUNTER — Other Ambulatory Visit: Payer: Self-pay

## 2018-06-08 DIAGNOSIS — C50311 Malignant neoplasm of lower-inner quadrant of right female breast: Secondary | ICD-10-CM

## 2018-06-09 ENCOUNTER — Telehealth: Payer: Self-pay | Admitting: Hematology and Oncology

## 2018-06-09 ENCOUNTER — Inpatient Hospital Stay: Payer: BC Managed Care – PPO | Attending: Hematology and Oncology

## 2018-06-09 ENCOUNTER — Inpatient Hospital Stay (HOSPITAL_BASED_OUTPATIENT_CLINIC_OR_DEPARTMENT_OTHER): Payer: BC Managed Care – PPO | Admitting: Hematology and Oncology

## 2018-06-09 ENCOUNTER — Inpatient Hospital Stay: Payer: BC Managed Care – PPO

## 2018-06-09 DIAGNOSIS — Z79899 Other long term (current) drug therapy: Secondary | ICD-10-CM

## 2018-06-09 DIAGNOSIS — Z170 Estrogen receptor positive status [ER+]: Secondary | ICD-10-CM | POA: Insufficient documentation

## 2018-06-09 DIAGNOSIS — C50311 Malignant neoplasm of lower-inner quadrant of right female breast: Secondary | ICD-10-CM

## 2018-06-09 DIAGNOSIS — Z79811 Long term (current) use of aromatase inhibitors: Secondary | ICD-10-CM | POA: Diagnosis not present

## 2018-06-09 DIAGNOSIS — Z923 Personal history of irradiation: Secondary | ICD-10-CM | POA: Diagnosis not present

## 2018-06-09 DIAGNOSIS — M818 Other osteoporosis without current pathological fracture: Secondary | ICD-10-CM

## 2018-06-09 LAB — CMP (CANCER CENTER ONLY)
ALT: 15 U/L (ref 0–44)
AST: 19 U/L (ref 15–41)
Albumin: 4 g/dL (ref 3.5–5.0)
Alkaline Phosphatase: 44 U/L (ref 38–126)
Anion gap: 7 (ref 5–15)
BUN: 17 mg/dL (ref 8–23)
CHLORIDE: 105 mmol/L (ref 98–111)
CO2: 28 mmol/L (ref 22–32)
Calcium: 9.1 mg/dL (ref 8.9–10.3)
Creatinine: 0.86 mg/dL (ref 0.44–1.00)
GFR, Est AFR Am: >60 mL/min (ref >60)
GFR, Estimated: >60 mL/min (ref >60)
Glucose, Bld: 78 mg/dL (ref 70–99)
Potassium: 4.6 mmol/L (ref 3.5–5.1)
Sodium: 140 mmol/L (ref 135–145)
Total Bilirubin: 0.4 mg/dL (ref 0.3–1.2)
Total Protein: 6.5 g/dL (ref 6.5–8.1)

## 2018-06-09 LAB — CBC WITH DIFFERENTIAL (CANCER CENTER ONLY)
ABS IMMATURE GRANULOCYTES: 0.02 10*3/uL (ref 0.00–0.07)
Basophils Absolute: 0 10*3/uL (ref 0.0–0.1)
Basophils Relative: 1 %
Eosinophils Absolute: 0.1 10*3/uL (ref 0.0–0.5)
Eosinophils Relative: 2 %
HCT: 40.4 % (ref 36.0–46.0)
Hemoglobin: 12.9 g/dL (ref 12.0–15.0)
Immature Granulocytes: 0 %
LYMPHS ABS: 0.9 10*3/uL (ref 0.7–4.0)
Lymphocytes Relative: 18 %
MCH: 30.5 pg (ref 26.0–34.0)
MCHC: 31.9 g/dL (ref 30.0–36.0)
MCV: 95.5 fL (ref 80.0–100.0)
Monocytes Absolute: 0.5 10*3/uL (ref 0.1–1.0)
Monocytes Relative: 11 %
Neutro Abs: 3.5 10*3/uL (ref 1.7–7.7)
Neutrophils Relative %: 68 %
Platelet Count: 194 10*3/uL (ref 150–400)
RBC: 4.23 MIL/uL (ref 3.87–5.11)
RDW: 12.9 % (ref 11.5–15.5)
WBC Count: 5.1 10*3/uL (ref 4.0–10.5)
nRBC: 0 % (ref 0.0–0.2)

## 2018-06-09 MED ORDER — DENOSUMAB 60 MG/ML ~~LOC~~ SOSY
60.0000 mg | PREFILLED_SYRINGE | Freq: Once | SUBCUTANEOUS | Status: AC
  Administered 2018-06-09: 60 mg via SUBCUTANEOUS

## 2018-06-09 MED ORDER — MAGNESIUM OXIDE 400 (241.3 MG) MG PO TABS: 400.0000 mg | ORAL_TABLET | Freq: Every day | ORAL | Status: DC

## 2018-06-09 MED ORDER — DENOSUMAB 60 MG/ML ~~LOC~~ SOSY: 60.0000 mg | PREFILLED_SYRINGE | Freq: Once | SUBCUTANEOUS | 0 refills | Status: AC

## 2018-06-09 MED ORDER — VORTIOXETINE HBR 20 MG PO TABS: 20.0000 mg | ORAL_TABLET | Freq: Every day | ORAL | Status: DC

## 2018-06-09 MED ORDER — ANASTROZOLE 1 MG PO TABS: 1.0000 mg | ORAL_TABLET | Freq: Every day | ORAL | 3 refills | Status: DC

## 2018-06-09 MED ORDER — DENOSUMAB 60 MG/ML ~~LOC~~ SOSY
PREFILLED_SYRINGE | SUBCUTANEOUS | Status: AC
  Filled 2018-06-09: qty 1

## 2018-06-09 NOTE — Assessment & Plan Note (Signed)
Rt Lumpectomy 03/26/15: 1.8 cm IDC grade 1, 0/6 LN negative, Er 100%, PR 70%, Her 2 Neg, Ki 67: 5% T1cN0 Stage 1A Oncotype DX score 16, 10% ROR Radiation therapy started 05/25/2015 discontinued after 12 fractions because of nonhealing wound infection  Current treatment: Adjuvant antiestrogen therapy with anastrozole 1 mg daily 5 years started 08/26/2015  High bleeding risk/poor wound healing: Healed completely Severe osteopeniaT score -2.4: Currently on Prolia with calcium and vitamin D.  Breast cancer surveillance: 1.  Breast exam 06/09/2018: Benign 2. mammogram 05/03/2018: No evidence of malignancy, breast density category C  Return to clinic in 1 year for follow up

## 2018-06-09 NOTE — Telephone Encounter (Signed)
Patient decline avs and calendar °

## 2018-06-09 NOTE — Patient Instructions (Signed)

## 2018-06-09 NOTE — Progress Notes (Signed)
Patient Care Team: Darcus Austin, MD as PCP - General (Family Medicine) Thurnell Lose, MD as Consulting Physician (Obstetrics and Gynecology) [name], MD as Consulting Physician (General Surgery) Nicholas Lose, MD as Consulting Physician (Hematology and Oncology) Thea Silversmith, MD as Consulting Physician (Radiation Oncology) Thea Silversmith, MD as Consulting Physician (Radiation Oncology) Mauro Kaufmann, RN as Registered Nurse Rockwell Germany, RN as Registered Nurse Jake Shark Johny Blamer, NP as Nurse Practitioner (Nurse Practitioner)  DIAGNOSIS:  Encounter Diagnosis  Name Primary?  . Malignant neoplasm of lower-inner quadrant of right breast of female, estrogen receptor positive ([place])     SUMMARY OF ONCOLOGIC HISTORY:   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 female breast ([place])   03/04/2015 Mammogram    Right breast possible distortion, ultrasound 03/11/2015 suspicious mass right breast 6:00 irregular hypoechoic mass 0.7 x 0.6 x 0.6 cm, adjacent to this innumerable cysts and clusters of cysts measuring 0.7 cm    03/13/2015 Initial Biopsy    Right breast biopsy: Invasive ductal carcinoma grade 1, ER+ (100%), PR+ (70%),  HER-2 negative (ratio 1.44),  Ki-67 5%    03/13/2015 Clinical Stage    Stage IA: T1b N0    03/26/2015 Surgery    Rt Lumpectomy: 1.8 cm IDC grade 1, 0/6 LN negative, ER+ 100%, PR+ 70%, Her 2 Neg (ratio 1.35), Ki 67: 5%    03/26/2015 Pathologic Stage    Stage IA: pT1c pN0    03/26/2015 Oncotype testing    RS 16 (10% ROR)    05/21/2015 - 06/09/2015 Radiation Therapy    Adjuvant RT to right breast: 32.04 Gy over 12 fractions; treatment stopped because of wound infection    08/26/2015 -  Anti-estrogen oral therapy    Anastrozole 1 mg daily    09/09/2015 Survivorship    Survivorship visit completed and copy of care plan given to patient     CHIEF COMPLIANT: Surveillance of breast cancer on Prolia for osteoporosis  INTERVAL HISTORY: Candace Campbell is a 62 year old above-mentioned history of right breast cancer treated with lumpectomy and radiation is currently on anastrozole therapy since February 2017.  She is tolerating it fairly well.  She does have some muscle stiffness.  She has no further problems with hot flashes.  Denies any lumps or nodules in the breast.  She has been getting Prolia for osteoporosis and apparently she got a huge bill from Prolia injection and would like to receive subsequent Prolia injections at her primary care office.  REVIEW OF SYSTEMS:   Constitutional: Denies fevers, chills or abnormal weight loss Eyes: Denies blurriness of vision Ears, nose, mouth, throat, and face: Denies mucositis or sore throat Respiratory: Denies cough, dyspnea or wheezes Cardiovascular: Denies palpitation, chest discomfort Gastrointestinal:  Denies nausea, heartburn or change in bowel habits Skin: Denies abnormal skin rashes Lymphatics: Denies new lymphadenopathy or easy bruising Neurological:Denies numbness, tingling or new weaknesses Behavioral/Psych: Mood is stable, no new changes  Extremities: No lower extremity edema Breast:  denies any pain or lumps or nodules in either breasts All other systems were reviewed with the patient and are negative.  I have reviewed the past medical history, past surgical history, social history and family history with the patient and they are unchanged from previous note.  ALLERGIES:  is allergic to latex; clindamycin/lincomycin; peanut-containing drug products; sulfa antibiotics; and iodine.  MEDICATIONS:  Current Outpatient Medications  Medication Sig Dispense Refill  . anastrozole (ARIMIDEX) 1 MG tablet TAKE 1 TABLET BY MOUTH EVERY DAY 90 tablet  0  . Calcium-Magnesium-Vitamin D (CALCIUM 500 PO) Take 1 tablet by mouth daily.    Marland Kitchen Dexlansoprazole (DEXILANT PO) Take 1 tablet by mouth daily.    Marland Kitchen lamoTRIgine (LAMICTAL) 200 MG tablet Take 200 mg by mouth at bedtime.     . Multiple Vitamin  (MULTIVITAMIN WITH MINERALS) TABS tablet Take 1 tablet by mouth daily.    Marland Kitchen omeprazole-sodium bicarbonate (ZEGERID) 40-1100 MG per capsule Take 1 capsule by mouth at bedtime.  3  . polyethylene glycol (MIRALAX / GLYCOLAX) packet Take 17 g by mouth daily as needed.    . TRAZODONE HCL PO Take 25 mg by mouth at bedtime as needed.    . Vitamin D, Cholecalciferol, 1000 units CAPS Take 1 tablet by mouth daily.     No current facility-administered medications for this visit.     PHYSICAL EXAMINATION: ECOG PERFORMANCE STATUS: 0 - Asymptomatic  Vitals:   06/09/18 1023  BP: 136/84  Pulse: 68  Resp: 17  Temp: 98.9 F (37.2 C)  SpO2: 97%   Filed Weights   06/09/18 1023  Weight: 134 lb 12.8 oz (61.1 kg)    GENERAL:alert, no distress and comfortable SKIN: skin color, texture, turgor are normal, no rashes or significant lesions EYES: normal, Conjunctiva are pink and non-injected, sclera clear OROPHARYNX:no exudate, no erythema and lips, buccal mucosa, and tongue normal  NECK: supple, thyroid normal size, non-tender, without nodularity LYMPH:  no palpable lymphadenopathy in the cervical, axillary or inguinal LUNGS: clear to auscultation and percussion with normal breathing effort HEART: regular rate & rhythm and no murmurs and no lower extremity edema ABDOMEN:abdomen soft, non-tender and normal bowel sounds MUSCULOSKELETAL:no cyanosis of digits and no clubbing  NEURO: alert & oriented x 3 with fluent speech, no focal motor/sensory deficits EXTREMITIES: No lower extremity edema BREAST: No palpable masses or nodules in either right or left breasts. No palpable axillary supraclavicular or infraclavicular adenopathy no breast tenderness or nipple discharge. (exam performed in the presence of a chaperone)  LABORATORY DATA:  I have reviewed the data as listed CMP Latest Ref Rng & Units 06/09/2018 12/09/2017 06/10/2017  Glucose 70 - 99 mg/dL 78 83 69(L)  BUN 8 - 23 mg/dL 17 12 14.7  Creatinine 0.44  - 1.00 mg/dL 0.86 0.84 0.8  Sodium 135 - 145 mmol/L 140 141 141  Potassium 3.5 - 5.1 mmol/L 4.6 4.1 4.2  Chloride 98 - 111 mmol/L 105 104 -  CO2 22 - 32 mmol/L '28 28 28  ' Calcium 8.9 - 10.3 mg/dL 9.1 9.1 9.1  Total Protein 6.5 - 8.1 g/dL 6.5 6.7 6.5  Total Bilirubin 0.3 - 1.2 mg/dL 0.4 0.4 0.44  Alkaline Phos 38 - 126 U/L 44 43 48  AST 15 - 41 U/L '19 16 15  ' ALT 0 - 44 U/L '15 11 12    ' Lab Results  Component Value Date   WBC 5.1 06/09/2018   HGB 12.9 06/09/2018   HCT 40.4 06/09/2018   MCV 95.5 06/09/2018   PLT 194 06/09/2018   NEUTROABS 3.5 06/09/2018    ASSESSMENT & PLAN:  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 female breast Rt Lumpectomy 03/26/15: 1.8 cm IDC grade 1, 0/6 LN negative, Er 100%, PR 70%, Her 2 Neg, Ki 67: 5% T1cN0 Stage 1A Oncotype DX score 16, 10% ROR Radiation therapy started 05/25/2015 discontinued after 12 fractions because of nonhealing wound infection  Current treatment: Adjuvant antiestrogen therapy with anastrozole 1 mg daily 5 years started 08/26/2015  High bleeding risk/poor  wound healing: Healed completely Severe osteopeniaT score -2.4: Currently on Prolia with calcium and vitamin D.  She will receive subsequent Prolia injections at her primary care office because the cost of the hospital associated clinic for Prolia is much higher than a true outpatient office.  Breast cancer surveillance: 1.  Breast exam 06/09/2018: Benign 2. mammogram  05/03/2018: No evidence of malignancy, breast density category C  Return to clinic in 1 year for follow up    No orders of the defined types were placed in this encounter.  The patient has a good understanding of the overall plan. she agrees with it. she will call with any problems that may develop before the next visit here.   Harriette Ohara, MD 06/09/18

## 2019-02-28 ENCOUNTER — Other Ambulatory Visit: Payer: Self-pay | Admitting: Obstetrics and Gynecology

## 2019-02-28 DIAGNOSIS — Z9889 Other specified postprocedural states: Secondary | ICD-10-CM

## 2019-02-28 DIAGNOSIS — M8580 Other specified disorders of bone density and structure, unspecified site: Secondary | ICD-10-CM

## 2019-03-29 ENCOUNTER — Ambulatory Visit (INDEPENDENT_AMBULATORY_CARE_PROVIDER_SITE_OTHER): Payer: BC Managed Care – PPO

## 2019-03-29 ENCOUNTER — Encounter: Payer: Self-pay | Admitting: Podiatry

## 2019-03-29 ENCOUNTER — Other Ambulatory Visit: Payer: Self-pay

## 2019-03-29 ENCOUNTER — Ambulatory Visit (INDEPENDENT_AMBULATORY_CARE_PROVIDER_SITE_OTHER): Payer: BC Managed Care – PPO | Admitting: Podiatry

## 2019-03-29 VITALS — BP 138/90 | HR 78 | Resp 16

## 2019-03-29 DIAGNOSIS — M778 Other enthesopathies, not elsewhere classified: Secondary | ICD-10-CM

## 2019-03-29 DIAGNOSIS — M2042 Other hammer toe(s) (acquired), left foot: Secondary | ICD-10-CM

## 2019-03-29 DIAGNOSIS — M7752 Other enthesopathy of left foot: Secondary | ICD-10-CM

## 2019-03-29 MED ORDER — METHYLPREDNISOLONE 4 MG PO TBPK: ORAL_TABLET | ORAL | 0 refills | Status: DC

## 2019-03-29 NOTE — Progress Notes (Signed)
Subjective:  Patient ID: Candace Campbell, female    DOB: 07/18/1955,  MRN: [id number] HPI She presents today with a chief complaint of a painful area to the plantar aspect of her second metatarsal phalangeal joint of the left foot.  She states that in June 2020 she stepped on a spiky dog toy which resulted in pain and swelling to the second toe and has slowly improved to just a mild pain at the second metatarsal phalangeal joint.  She still notices swelling at the metatarsal phalangeal joint she was referred from Dr. Alfonso Ramus [facility].  She is an avid walker.  63 y.o. female presents with the above complaint.   ROS: She denies fever chills nausea vomiting muscle aches pains calf pain back pain chest pain shortness of breath headache.  Past Medical History:  Diagnosis Date  . Anxiety   . Blood dyscrasia    states daughter has Von Willebrand and she was tested 03-19-15 by Dr Lindi Adie  . Breast cancer ([place]) 03/13/15   right breast   . Depression   . GERD (gastroesophageal reflux disease)   . Headache    HX MIGRAINES  . History of kidney stones   . Personal history of radiation therapy   . PONV (postoperative nausea and vomiting)    Past Surgical History:  Procedure Laterality Date  . ABDOMINAL HYSTERECTOMY    . BREAST BIOPSY Right 2016  . BREAST LUMPECTOMY Right 2016  . BREAST LUMPECTOMY WITH RADIOACTIVE SEED AND SENTINEL LYMPH NODE BIOPSY Right 03/26/2015   Procedure: BREAST LUMPECTOMY WITH RADIOACTIVE SEED AND SENTINEL LYMPH NODE BIOPSY;  Surgeon: [name], MD;  Location: [facility];  Service: General;  Laterality: Right;  . BUNIONECTOMY     BILAT  . LUMBAR LAMINECTOMY/DECOMPRESSION MICRODISCECTOMY Right 03/03/2016   Procedure: RIGHT LUMBAR FOUR-FIVE LUMBAR LAMINECTOMYAND MICRODISCECTOMY;  Surgeon: Jovita Gamma, MD;  Location: [facility];  Service: Neurosurgery;  Laterality: Right;  right    Current Outpatient Medications:  .  anastrozole  (ARIMIDEX) 1 MG tablet, Take 1 tablet (1 mg total) by mouth daily., Disp: 90 tablet, Rfl: 3 .  atomoxetine (STRATTERA) 40 MG capsule, TK 1 C PO QAM, Disp: , Rfl:  .  azelastine (ASTELIN) 0.1 % nasal spray, U 1 SPR IEN BID, Disp: , Rfl:  .  busPIRone (BUSPAR) 15 MG tablet, TK 1 AND 1/2 T PO AM AND TK 2 TS QHS FOR ANXIETY PRN. MAY TAKE NOON DOSE IF NEEDED FOR SEVERE ANXIETY, Disp: , Rfl:  .  Calcium-Magnesium-Vitamin D (CALCIUM 500 PO), Take 1 tablet by mouth daily., Disp: , Rfl:  .  clotrimazole (LOTRIMIN) 1 % cream, APPLY TO AFFECTED AREA BID, Disp: , Rfl:  .  Dexlansoprazole (DEXILANT PO), Take 1 tablet by mouth daily., Disp: , Rfl:  .  lamoTRIgine (LAMICTAL) 200 MG tablet, Take 200 mg by mouth at bedtime. , Disp: , Rfl:  .  magnesium oxide (MAG-OX) 400 (241.3 Mg) MG tablet, Take 1 tablet (400 mg total) by mouth daily., Disp: , Rfl:  .  methylPREDNISolone (MEDROL DOSEPAK) 4 MG TBPK tablet, 6 day dose pack - take as directed, Disp: 21 tablet, Rfl: 0 .  Multiple Vitamin (MULTIVITAMIN WITH MINERALS) TABS tablet, Take 1 tablet by mouth daily., Disp: , Rfl:  .  omeprazole-sodium bicarbonate (ZEGERID) 40-1100 MG per capsule, Take 1 capsule by mouth at bedtime., Disp: , Rfl: 3 .  polyethylene glycol (MIRALAX / GLYCOLAX) packet, Take 17 g by mouth daily as needed.,  Disp: , Rfl:  .  TRAZODONE HCL PO, Take 25 mg by mouth at bedtime as needed., Disp: , Rfl:  .  Vitamin D, Cholecalciferol, 1000 units CAPS, Take 1 tablet by mouth daily., Disp: , Rfl:  .  vortioxetine HBr (TRINTELLIX) 20 MG TABS tablet, Take 1 tablet (20 mg total) by mouth daily., Disp: 30 tablet, Rfl:  .  XIFAXAN 550 MG TABS tablet, TK 1 T PO TID, Disp: , Rfl:   Allergies  Allergen Reactions  . Latex Swelling  . Clindamycin/Lincomycin Hives  . Peanut-Containing Drug Products Other (See Comments)    Headache/migraine  . Sulfa Antibiotics Other (See Comments)    UNSPECIFIED REACTION Pt does not remember the symptoms "They told me I  should not take it anymore".    . Iodine Swelling and Rash    Including Betadine    Review of Systems Objective:   Vitals:   03/29/19 1048  BP: 138/90  Pulse: 78  Resp: 16    General: Well developed, nourished, in no acute distress, alert and oriented x3   Dermatological: Skin is warm, dry and supple bilateral. Nails x 10 are well maintained; remaining integument appears unremarkable at this time. There are no open sores, no preulcerative lesions, no rash or signs of infection present.  Vascular: Dorsalis Pedis artery and Posterior Tibial artery pedal pulses are 2/4 bilateral with immedate capillary fill time. Pedal hair growth present. No varicosities and no lower extremity edema present bilateral.   Neruologic: Grossly intact via light touch bilateral. Vibratory intact via tuning fork bilateral. Protective threshold with Semmes Wienstein monofilament intact to all pedal sites bilateral. Patellar and Achilles deep tendon reflexes 2+ bilateral. No Babinski or clonus noted bilateral.   Musculoskeletal: No gross boney pedal deformities bilateral. No pain, crepitus, or limitation noted with foot and ankle range of motion bilateral. Muscular strength 5/5 in all groups tested bilateral.  Mild edema about the second and third metatarsal phalangeal joint areas of the left foot with mild erythema overlying the PIPJ of a hammertoe deformity second digit left with mild medial deviation of the toe toward the hallux.  She has pain on end range of motion of the joint there is no dorsal dislocation of the joint though the joint itself is dorsiflexed.  Gait: Unassisted, Nonantalgic.    Radiographs:  Radiographs were reviewed that were sent of her nonweightbearing status demonstrating no acute trauma.  Assessment & Plan:   Assessment: Capsulitis and hammertoe deformity second metatarsal phalangeal joint and second toe left foot.  Plan: Recommended that she stay in a stiff soled shoe.  I injected 2  mg of dexamethasone and local anesthetic into the second metatarsal phalangeal joint after sterile Betadine skin prep which she tolerated very well.  Also is put her on a Medrol Dosepak.  I will follow-up with her in 1 month.     Juanitta Earnhardt T. [place], [place]

## 2019-04-02 ENCOUNTER — Other Ambulatory Visit: Payer: Self-pay | Admitting: Hematology and Oncology

## 2019-04-02 DIAGNOSIS — C50311 Malignant neoplasm of lower-inner quadrant of right female breast: Secondary | ICD-10-CM

## 2019-05-03 ENCOUNTER — Ambulatory Visit: Payer: BC Managed Care – PPO | Admitting: Podiatry

## 2019-05-04 ENCOUNTER — Other Ambulatory Visit: Payer: Self-pay | Admitting: Hematology and Oncology

## 2019-05-04 DIAGNOSIS — Z9889 Other specified postprocedural states: Secondary | ICD-10-CM

## 2019-05-08 ENCOUNTER — Ambulatory Visit
Admission: RE | Admit: 2019-05-08 | Discharge: 2019-05-08 | Disposition: A | Discharge status: Home / Self Care | Payer: BC Managed Care – PPO | Source: Ambulatory Visit | Attending: Obstetrics and Gynecology | Admitting: Obstetrics and Gynecology

## 2019-05-08 ENCOUNTER — Other Ambulatory Visit: Payer: Self-pay

## 2019-05-08 DIAGNOSIS — M8580 Other specified disorders of bone density and structure, unspecified site: Secondary | ICD-10-CM

## 2019-05-08 DIAGNOSIS — Z9889 Other specified postprocedural states: Secondary | ICD-10-CM

## 2019-05-10 ENCOUNTER — Ambulatory Visit: Payer: BC Managed Care – PPO | Admitting: Podiatry

## 2019-06-10 NOTE — Progress Notes (Signed)
Patient Care Team: [name], MD as PCP - General (Family Medicine) Thurnell Lose, MD as Consulting Physician (Obstetrics and Gynecology) [name], MD as Consulting Physician (General Surgery) Nicholas Lose, MD as Consulting Physician (Hematology and Oncology) Thea Silversmith, MD as Consulting Physician (Radiation Oncology) Thea Silversmith, MD as Consulting Physician (Radiation Oncology) Mauro Kaufmann, RN as Registered Nurse Rockwell Germany, RN as Registered Nurse Jake Shark Johny Blamer, NP as Nurse Practitioner (Nurse Practitioner)  DIAGNOSIS:    ICD-10-CM   1. Malignant neoplasm of lower-inner quadrant of right breast of female, estrogen receptor positive ([place])  C50.311    Z17.0   2. 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 female breast ([place])  C50.311 anastrozole (ARIMIDEX) 1 MG tablet    SUMMARY OF ONCOLOGIC HISTORY: Oncology History  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 female breast ([place])  03/04/2015 Mammogram   Right breast possible distortion, ultrasound 03/11/2015 suspicious mass right breast 6:00 irregular hypoechoic mass 0.7 x 0.6 x 0.6 cm, adjacent to this innumerable cysts and clusters of cysts measuring 0.7 cm   03/13/2015 Initial Biopsy   Right breast biopsy: Invasive ductal carcinoma grade 1, ER+ (100%), PR+ (70%),  HER-2 negative (ratio 1.44),  Ki-67 5%   03/13/2015 Clinical Stage   Stage IA: T1b N0   03/26/2015 Surgery   Rt Lumpectomy: 1.8 cm IDC grade 1, 0/6 LN negative, ER+ 100%, PR+ 70%, Her 2 Neg (ratio 1.35), Ki 67: 5%   03/26/2015 Pathologic Stage   Stage IA: pT1c pN0   03/26/2015 Oncotype testing   RS 16 (10% ROR)   05/21/2015 - 06/09/2015 Radiation Therapy   Adjuvant RT to right breast: 32.04 Gy over 12 fractions; treatment stopped because of wound infection   08/26/2015 -  Anti-estrogen oral therapy   Anastrozole 1 mg daily   09/09/2015 Survivorship   Survivorship visit completed and copy of care plan given to  patient     CHIEF COMPLIANT: Follow-up of breast cancer on anastrozole  INTERVAL HISTORY: Candace Campbell is a 63 y.o. with above-mentioned history of right breast cancer treated with lumpectomy, radiation, and who is currently on anastrozole. Mammogram on 05/08/19 showed no evidence of malignancy bilaterally. Bone density scan on 05/08/19 showed osteopenia with a T-score of -2.4 at the AP spine. She presents to the clinic today for annual follow-up.    She has lost about 20 pounds and feels great.  REVIEW OF SYSTEMS:   Constitutional: Denies fevers, chills or abnormal weight loss Eyes: Denies blurriness of vision Ears, nose, mouth, throat, and face: Denies mucositis or sore throat Respiratory: Denies cough, dyspnea or wheezes Cardiovascular: Denies palpitation, chest discomfort Gastrointestinal: Denies nausea, heartburn or change in bowel habits Skin: Denies abnormal skin rashes Lymphatics: Denies new lymphadenopathy or easy bruising Neurological: Denies numbness, tingling or new weaknesses Behavioral/Psych: Mood is stable, no new changes  Extremities: No lower extremity edema Breast: denies any pain or lumps or nodules in either breasts All other systems were reviewed with the patient and are negative.  I have reviewed the past medical history, past surgical history, social history and family history with the patient and they are unchanged from previous note.  ALLERGIES:  is allergic to latex; clindamycin/lincomycin; peanut-containing drug products; sulfa antibiotics; and iodine.  MEDICATIONS:  Current Outpatient Medications  Medication Sig Dispense Refill  . anastrozole (ARIMIDEX) 1 MG tablet Take 1 tablet (1 mg total) by mouth daily. 90 tablet 3  . atomoxetine (STRATTERA) 40 MG capsule TK 1 C  PO QAM    . azelastine (ASTELIN) 0.1 % nasal spray U 1 SPR IEN BID    . busPIRone (BUSPAR) 15 MG tablet TK 1 AND 1/2 T PO AM AND TK 2 TS QHS FOR ANXIETY PRN. MAY TAKE NOON DOSE IF NEEDED FOR  SEVERE ANXIETY    . Calcium-Magnesium-Vitamin D (CALCIUM 500 PO) Take 1 tablet by mouth daily.    . clotrimazole (LOTRIMIN) 1 % cream APPLY TO AFFECTED AREA BID    . Dexlansoprazole (DEXILANT PO) Take 1 tablet by mouth daily.    Marland Kitchen lamoTRIgine (LAMICTAL) 200 MG tablet Take 200 mg by mouth at bedtime.     . magnesium oxide (MAG-OX) 400 (241.3 Mg) MG tablet Take 1 tablet (400 mg total) by mouth daily.    . Multiple Vitamin (MULTIVITAMIN WITH MINERALS) TABS tablet Take 1 tablet by mouth daily.    Marland Kitchen omeprazole-sodium bicarbonate (ZEGERID) 40-1100 MG per capsule Take 1 capsule by mouth at bedtime.  3  . polyethylene glycol (MIRALAX / GLYCOLAX) packet Take 17 g by mouth daily as needed.    . TRAZODONE HCL PO Take 25 mg by mouth at bedtime as needed.    . Vitamin D, Cholecalciferol, 1000 units CAPS Take 1 tablet by mouth daily.    Marland Kitchen vortioxetine HBr (TRINTELLIX) 20 MG TABS tablet Take 1 tablet (20 mg total) by mouth daily. 30 tablet    No current facility-administered medications for this visit.     PHYSICAL EXAMINATION: ECOG PERFORMANCE STATUS: 1 - Symptomatic but completely ambulatory  Vitals:   06/11/19 1458  BP: 136/86  Pulse: 99  Resp: 18  Temp: 98.9 F (37.2 C)  SpO2: 100%   Filed Weights   06/11/19 1458  Weight: 129 lb (58.5 kg)    GENERAL: alert, no distress and comfortable SKIN: skin color, texture, turgor are normal, no rashes or significant lesions EYES: normal, Conjunctiva are pink and non-injected, sclera clear OROPHARYNX: no exudate, no erythema and lips, buccal mucosa, and tongue normal  NECK: supple, thyroid normal size, non-tender, without nodularity LYMPH: no palpable lymphadenopathy in the cervical, axillary or inguinal LUNGS: clear to auscultation and percussion with normal breathing effort HEART: regular rate & rhythm and no murmurs and no lower extremity edema ABDOMEN: abdomen soft, non-tender and normal bowel sounds MUSCULOSKELETAL: no cyanosis of digits and  no clubbing  NEURO: alert & oriented x 3 with fluent speech, no focal motor/sensory deficits EXTREMITIES: No lower extremity edema BREAST: No palpable masses or nodules in either right or left breasts. No palpable axillary supraclavicular or infraclavicular adenopathy no breast tenderness or nipple discharge. (exam performed in the presence of a chaperone)  LABORATORY DATA:  I have reviewed the data as listed CMP Latest Ref Rng & Units 06/09/2018 12/09/2017 06/10/2017  Glucose 70 - 99 mg/dL 78 83 69(L)  BUN 8 - 23 mg/dL 17 12 14.7  Creatinine 0.44 - 1.00 mg/dL 0.86 0.84 0.8  Sodium 135 - 145 mmol/L 140 141 141  Potassium 3.5 - 5.1 mmol/L 4.6 4.1 4.2  Chloride 98 - 111 mmol/L 105 104 -  CO2 22 - 32 mmol/L '28 28 28  ' Calcium 8.9 - 10.3 mg/dL 9.1 9.1 9.1  Total Protein 6.5 - 8.1 g/dL 6.5 6.7 6.5  Total Bilirubin 0.3 - 1.2 mg/dL 0.4 0.4 0.44  Alkaline Phos 38 - 126 U/L 44 43 48  AST 15 - 41 U/L '19 16 15  ' ALT 0 - 44 U/L '15 11 12    ' Lab Results  Component Value Date   WBC 5.1 06/09/2018   HGB 12.9 06/09/2018   HCT 40.4 06/09/2018   MCV 95.5 06/09/2018   PLT 194 06/09/2018   NEUTROABS 3.5 06/09/2018    ASSESSMENT & PLAN:  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 female breast Rt Lumpectomy 03/26/15: 1.8 cm IDC grade 1, 0/6 LN negative, Er 100%, PR 70%, Her 2 Neg, Ki 67: 5% T1cN0 Stage 1A Oncotype DX score 16, 10% ROR Radiation therapy started 05/25/2015 discontinued after 12 fractions because of nonhealing wound infection  Current treatment: Adjuvant antiestrogen therapy with anastrozole 1 mg daily 5 years started 08/26/2015 Patient lost 20 pounds and is feeling great.  High bleeding risk/poor wound healing:Healed completely Severe osteopeniaT score -2.4: Currently on Prolia with calcium and vitamin D (with her Rheumatologist).  Breast cancer surveillance: 1.Breast exam 06/11/2019: Benign 2.Mammogram 05/08/2019: No evidence of malignancy, breast density category C   Return to clinic in1 yearfor follow up    No orders of the defined types were placed in this encounter.  The patient has a good understanding of the overall plan. she agrees with it. she will call with any problems that may develop before the next visit here.  Nicholas Lose, MD 06/11/2019  Julious Oka Dorshimer, am acting as scribe for Dr. Nicholas Lose.  I have reviewed the above documentation for accuracy and completeness, and I agree with the above.

## 2019-06-11 ENCOUNTER — Inpatient Hospital Stay: Payer: BC Managed Care – PPO | Attending: Hematology and Oncology | Admitting: Hematology and Oncology

## 2019-06-11 ENCOUNTER — Other Ambulatory Visit: Payer: Self-pay

## 2019-06-11 DIAGNOSIS — Z923 Personal history of irradiation: Secondary | ICD-10-CM | POA: Insufficient documentation

## 2019-06-11 DIAGNOSIS — Z888 Allergy status to other drugs, medicaments and biological substances status: Secondary | ICD-10-CM | POA: Insufficient documentation

## 2019-06-11 DIAGNOSIS — Z882 Allergy status to sulfonamides status: Secondary | ICD-10-CM | POA: Diagnosis not present

## 2019-06-11 DIAGNOSIS — Z79899 Other long term (current) drug therapy: Secondary | ICD-10-CM | POA: Insufficient documentation

## 2019-06-11 DIAGNOSIS — Z79811 Long term (current) use of aromatase inhibitors: Secondary | ICD-10-CM | POA: Diagnosis not present

## 2019-06-11 DIAGNOSIS — C50311 Malignant neoplasm of lower-inner quadrant of right female breast: Secondary | ICD-10-CM | POA: Diagnosis present

## 2019-06-11 DIAGNOSIS — Z881 Allergy status to other antibiotic agents status: Secondary | ICD-10-CM | POA: Diagnosis not present

## 2019-06-11 DIAGNOSIS — Z170 Estrogen receptor positive status [ER+]: Secondary | ICD-10-CM | POA: Diagnosis not present

## 2019-06-11 DIAGNOSIS — M8580 Other specified disorders of bone density and structure, unspecified site: Secondary | ICD-10-CM | POA: Insufficient documentation

## 2019-06-11 MED ORDER — ANASTROZOLE 1 MG PO TABS: 1.0000 mg | ORAL_TABLET | Freq: Every day | ORAL | 3 refills | Status: DC

## 2019-06-11 NOTE — Assessment & Plan Note (Signed)
Rt Lumpectomy 03/26/15: 1.8 cm IDC grade 1, 0/6 LN negative, Er 100%, PR 70%, Her 2 Neg, Ki 67: 5% T1cN0 Stage 1A Oncotype DX score 16, 10% ROR Radiation therapy started 05/25/2015 discontinued after 12 fractions because of nonhealing wound infection  Current treatment: Adjuvant antiestrogen therapy with anastrozole 1 mg daily 5 years started 08/26/2015  High bleeding risk/poor wound healing:Healed completely Severe osteopeniaT score -2.4: Currently on Prolia with calcium and vitamin D.  She will receive subsequent Prolia injections at her primary care office because the cost of the hospital associated clinic for Prolia is much higher than a true outpatient office.  Breast cancer surveillance: 1.Breast exam 06/11/2019: Benign 2.Mammogram 05/08/2019: No evidence of malignancy, breast density category C  Return to clinic in1 yearfor follow up

## 2019-06-12 ENCOUNTER — Telehealth: Payer: Self-pay | Admitting: Hematology and Oncology

## 2019-06-12 NOTE — Telephone Encounter (Signed)
I left a message regarding schedule  

## 2019-09-01 ENCOUNTER — Ambulatory Visit: Payer: BC Managed Care – PPO | Attending: Internal Medicine

## 2019-09-01 DIAGNOSIS — Z23 Encounter for immunization: Secondary | ICD-10-CM | POA: Insufficient documentation

## 2019-09-01 NOTE — Progress Notes (Signed)
   Covid-19 Vaccination Clinic  Name:  Candace Campbell    MRN: [id number] DOB: 07/30/1955  09/01/2019  [name] was observed post Covid-19 immunization for 15 minutes without incidence. She was provided with Vaccine Information Sheet and instruction to access the V-Safe system.   [name] was instructed to call 911 with any severe reactions post vaccine: Marland Kitchen Difficulty breathing  . Swelling of your face and throat  . A fast heartbeat  . A bad rash all over your body  . Dizziness and weakness    Immunizations Administered    Name Date Dose VIS Date Route   Pfizer COVID-19 Vaccine 09/01/2019  9:15 AM 0.3 mL 06/15/2019 Intramuscular   Manufacturer: [place]   Lot: [id number]   [place]: [id number]

## 2019-09-22 ENCOUNTER — Ambulatory Visit: Payer: BC Managed Care – PPO | Attending: Internal Medicine

## 2019-09-22 DIAGNOSIS — Z23 Encounter for immunization: Secondary | ICD-10-CM

## 2019-09-22 NOTE — Progress Notes (Signed)
   Covid-19 Vaccination Clinic  Name:  Candace Campbell    MRN: [id number] DOB: 07/02/1956  09/22/2019  [name] was observed post Covid-19 immunization for 15 minutes without incident. She was provided with Vaccine Information Sheet and instruction to access the V-Safe system.   [name] was instructed to call 911 with any severe reactions post vaccine: Marland Kitchen Difficulty breathing  . Swelling of face and throat  . A fast heartbeat  . A bad rash all over body  . Dizziness and weakness   Immunizations Administered    Name Date Dose VIS Date Route   Pfizer COVID-19 Vaccine 09/22/2019 10:23 AM 0.3 mL 06/15/2019 Intramuscular   Manufacturer: [place]   Lot: [id number]   [place]: [id number]

## 2019-09-26 ENCOUNTER — Ambulatory Visit: Payer: BC Managed Care – PPO

## 2020-04-07 ENCOUNTER — Other Ambulatory Visit: Payer: Self-pay | Admitting: Hematology and Oncology

## 2020-04-07 DIAGNOSIS — Z853 Personal history of malignant neoplasm of breast: Secondary | ICD-10-CM

## 2020-04-07 DIAGNOSIS — Z9889 Other specified postprocedural states: Secondary | ICD-10-CM

## 2020-04-15 ENCOUNTER — Other Ambulatory Visit: Payer: Self-pay

## 2020-04-15 ENCOUNTER — Ambulatory Visit: Payer: BC Managed Care – PPO | Admitting: Neurology

## 2020-04-15 ENCOUNTER — Encounter: Payer: Self-pay | Admitting: Neurology

## 2020-04-15 VITALS — BP 129/80 | HR 68 | Ht 64.0 in | Wt 130.0 lb

## 2020-04-15 DIAGNOSIS — R413 Other amnesia: Secondary | ICD-10-CM

## 2020-04-15 DIAGNOSIS — R4701 Aphasia: Secondary | ICD-10-CM

## 2020-04-15 NOTE — Progress Notes (Signed)
[id number]    Provider:  Dr Jaynee Eagles Requesting Provider: Corena Pilgrim, MD Primary Care Provider:  [name], MD  CC:  Memory concerns  HPI:  Candace Campbell is a 64 y.o. female here as requested by Corena Pilgrim, MD for word loss and short-term memory loss. PMHx depression and anxiety. I reviewed Dr. Marquis Buggy notes, last seen for chief complaint "my mother passed away and my anxiety has been terrible".  Patient last reported that her mother passed away and her anxiety had been terrible, medications are working well, occasional depressive symptoms of low energy, lack of motivation, irritability, crying spells due to thinking about the loss of her mother, having difficulty with finding words and short-term memory which she is requesting referral to neurology and therapy, denies suicidal or homicidal ideations, hallucinations or delusions, she was on Strattera for ADHD but asked to be weaned off of that, diagnosed by Dr. 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  She started noticing symptoms "a long time ago", she puts letters in the wrong position, someone will tell her something and she has no recollection of it, seems so much worse this year, her mother had dementia unknown type started around 32 and she dies at the age of 31. 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 She will make mistakes on orders. She sent her invoice in and she will have added it up wrong, getting frustrating. She already had formal neurocognitive testing and diagnosed with anxiety not ADHD. She has been forgetful since a cild, she would forget tings in her locker,  short-term memory loss is not new but the last 6-7 years it has worse with short-term memory loss, concentrating, she has problems getting words out and feels really slow she has to really think about it. She forgets the words of things at home. She has sleep apnea and uses a sleep appliance.   Reviewed notes, labs and imaging from outside physicians, which showed: see above  Review of Systems: Patient complains of symptoms per HPI as well as the following symptoms: anxiety. Pertinent negatives and positives per HPI. All others negative.   Social History   Socioeconomic History  . Marital status: Married    Spouse name: Not on file  . Number of children: Not on file  . Years of education: Not on file  . Highest education level: Bachelor's degree (e.g., BA, AB, BS)  Occupational History  . Not on file  Tobacco Use  . Smoking status: Never Smoker  . Smokeless tobacco: Never Used  Substance and Sexual Activity  . Alcohol use: Yes    Alcohol/week: 0.0 standard drinks    Comment: social  . Drug use: No  . Sexual activity: Yes    Birth control/protection: Surgical  Other Topics Concern  . Not on file  Social History Narrative   Lives at home with spouse   Right handed   Caffeine: occasional green tea 1 cup   Social Determinants of Health   Financial Resource Strain:   . Difficulty of Paying Living Expenses: Not on file  Food Insecurity:   . Worried About Charity fundraiser in the Last Year: Not on file  .  Ran Out of Food in the Last Year: Not on file  Transportation Needs:   . Lack of Transportation (Medical): Not on file  . Lack of Transportation (Non-Medical): Not on file  Physical Activity:   . Days of Exercise per Week: Not on file  . Minutes of Exercise per Session: Not on file  Stress:   . Feeling of Stress : Not on file  Social Connections:   . Frequency of Communication with Friends and Family: Not on file  . Frequency of Social Gatherings with Friends and  Family: Not on file  . Attends Religious Services: Not on file  . Active Member of Clubs or Organizations: Not on file  . Attends Archivist Meetings: Not on file  . Marital Status: Not on file  Intimate Partner Violence:   . Fear of Current or Ex-Partner: Not on file  . Emotionally Abused: Not on file  . Physically Abused: Not on file  . Sexually Abused: Not on file    Family History  Problem Relation Age of Onset  . Dementia Mother   . Breast cancer Maternal Grandmother 3    Past Medical History:  Diagnosis Date  . Anxiety   . Blood dyscrasia    states daughter has Von Willebrand and she was tested 03-19-15 by Dr Lindi Adie  . Breast cancer ([place]) 03/13/15   right breast   . Depression   . GERD (gastroesophageal reflux disease)   . Headache    HX MIGRAINES  . History of kidney stones   . Osteopenia    per notes from [facility]   . Personal history of radiation therapy   . PONV (postoperative nausea and vomiting)     Patient Active Problem List   Diagnosis Date Noted  . HNP (herniated nucleus pulposus), lumbar 03/03/2016  . At high risk for bleeding 03/19/2015  . 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 female breast ([place]) 03/17/2015    Past Surgical History:  Procedure Laterality Date  . ABDOMINAL HYSTERECTOMY    . BREAST BIOPSY Right 2016  . BREAST LUMPECTOMY Right 2016  . BREAST LUMPECTOMY WITH RADIOACTIVE SEED AND SENTINEL LYMPH NODE BIOPSY Right 03/26/2015   Procedure: BREAST LUMPECTOMY WITH RADIOACTIVE SEED AND SENTINEL LYMPH NODE BIOPSY;  Surgeon: [name], MD;  Location: [place];  Service: General;  Laterality: Right;  . BUNIONECTOMY     BILAT  . LUMBAR LAMINECTOMY/DECOMPRESSION MICRODISCECTOMY Right 03/03/2016   Procedure: RIGHT LUMBAR FOUR-FIVE LUMBAR LAMINECTOMYAND MICRODISCECTOMY;  Surgeon: Jovita Gamma, MD;  Location: [facility];  Service: Neurosurgery;  Laterality: Right;  right    Current  Outpatient Medications  Medication Sig Dispense Refill  . anastrozole (ARIMIDEX) 1 MG tablet Take 1 tablet (1 mg total) by mouth daily. 90 tablet 3  . azelastine (ASTELIN) 0.1 % nasal spray U 1 SPR IEN BID    . busPIRone (BUSPAR) 15 MG tablet TK 1 AND 1/2 T PO AM AND TK 2 TS QHS FOR ANXIETY PRN. MAY TAKE NOON DOSE IF NEEDED FOR SEVERE ANXIETY    . Calcium-Magnesium-Vitamin D (CALCIUM 500 PO) Take 1 tablet by mouth daily.    . clotrimazole (LOTRIMIN) 1 % cream APPLY TO AFFECTED AREA BID    . Denosumab (PROLIA [place]) Inject into the skin every 6 (six) months.    . Dexlansoprazole (DEXILANT PO) Take 1 tablet by mouth daily.    Marland Kitchen lamoTRIgine (LAMICTAL) 200 MG tablet Take 200 mg by mouth at bedtime.     Marland Kitchen  magnesium oxide (MAG-OX) 400 (241.3 Mg) MG tablet Take 1 tablet (400 mg total) by mouth daily.    . Multiple Vitamin (MULTIVITAMIN WITH MINERALS) TABS tablet Take 1 tablet by mouth daily.    Marland Kitchen omeprazole-sodium bicarbonate (ZEGERID) 40-1100 MG capsule Take 1 capsule by mouth at bedtime.    . polyethylene glycol (MIRALAX / GLYCOLAX) packet Take 17 g by mouth daily as needed.    . TRAZODONE HCL PO Take 50 mg by mouth at bedtime as needed.     . Vitamin D, Cholecalciferol, 1000 units CAPS Take 1 tablet by mouth daily.    Marland Kitchen vortioxetine HBr (TRINTELLIX) 20 MG TABS tablet Take 1 tablet (20 mg total) by mouth daily. 30 tablet    No current facility-administered medications for this visit.    Allergies as of 04/15/2020 - Review Complete 04/15/2020  Allergen Reaction Noted  . Latex Swelling 03/19/2015  . Clindamycin/lincomycin Hives 06/24/2015  . Peanut-containing drug products Other (See Comments) 03/29/2015  . Sulfa antibiotics Other (See Comments) 03/19/2015  . Iodine Swelling and Rash 03/29/2015    Vitals: BP 129/80 (BP Location: Left Arm, Patient Position: Sitting)   Pulse 68   Ht 5\' 4"  (1.626 m)   Wt 130 lb (59 kg)   BMI 22.31 kg/m  Last Weight:  Wt Readings from Last 1 Encounters:    04/15/20 130 lb (59 kg)   Last Height:   Ht Readings from Last 1 Encounters:  04/15/20 5\' 4"  (1.626 m)     Physical exam: Exam: Gen: NAD, conversant, well nourised, thin, well groomed                     CV: RRR, no MRG. No Carotid Bruits. No peripheral edema, warm, nontender Eyes: Conjunctivae clear without exudates or hemorrhage  Neuro: Detailed Neurologic Exam  Speech:    Speech is normal; fluent and spontaneous with normal comprehension.  Cognition:  MMSE - Mini Mental State Exam 04/15/2020  Orientation to time 5  Orientation to Place 4  Registration 3  Attention/ Calculation 4  Recall 2  Language- name 2 objects 2  Language- repeat 1  Language- follow 3 step command 3  Language- read & follow direction 1  Write a sentence 1  Copy design 1  Total score 27       The patient is oriented to person, place, and time;     recent and remote memory intact;     language fluent;     normal attention, concentration,     fund of knowledge Cranial Nerves:    The pupils are equal, round, and reactive to light. The fundi are flat. Visual fields are full to finger confrontation. Extraocular movements are intact. Trigeminal sensation is intact and the muscles of mastication are normal. The face is symmetric. The palate elevates in the midline. Hearing intact. Voice is normal. Shoulder shrug is normal. The tongue has normal motion without fasciculations.   Coordination:    No dysmetria or ataxia   Gait:  normal native gait  Motor Observation:    No asymmetry, no atrophy, and no involuntary movements noted. Tone:    Normal muscle tone.    Posture:    Posture is normal. normal erect    Strength:    Strength is V/V in the upper and lower limbs.      Sensation: intact to LT     Reflex Exam:  DTR's:    Deep tendon reflexes in the upper and lower  extremities are 2+ bilaterally.   Toes:    The toes are downgoing bilaterally.   Clonus:    Clonus is absent.     Assessment/Plan:  58 with reported stress, significant anxiety which is likely causing her symptoms along with normal cognitive aging. Reassured patient. We will check blood work. Offered MRI of the brain, she declines at this time. Can follow up as needed with neurology if symptoms worsen. Already had formal neurocognitive testing(per patient report) diagnosed with anxiety as the cause of symptoms.   Recommend the"XX Brain" to read  Orders Placed This Encounter  Procedures  . CBC  . Comprehensive metabolic panel  . B12 and Folate Panel  . Vitamin B1  . Methylmalonic acid, serum     Cc: Corena Pilgrim, MD,  [name], MD  Sarina Ill, MD  [facility] [address] [place], [place] [address]  Phone [phone] Fax [phone]

## 2020-04-15 NOTE — Patient Instructions (Signed)

## 2020-04-21 LAB — COMPREHENSIVE METABOLIC PANEL
ALT: 17 IU/L (ref 0–32)
AST: 24 IU/L (ref 0–40)
Albumin/Globulin Ratio: 3.1 — ABNORMAL HIGH (ref 1.2–2.2)
Albumin: 4.6 g/dL (ref 3.8–4.8)
Alkaline Phosphatase: 48 IU/L (ref 44–121)
BUN/Creatinine Ratio: 21 (ref 12–28)
BUN: 16 mg/dL (ref 8–27)
Bilirubin Total: 0.3 mg/dL (ref 0.0–1.2)
CO2: 28 mmol/L (ref 20–29)
Calcium: 9.3 mg/dL (ref 8.7–10.3)
Chloride: 101 mmol/L (ref 96–106)
Creatinine, Ser: 0.75 mg/dL (ref 0.57–1.00)
GFR calc Af Amer: 99 mL/min/{1.73_m2} (ref >59)
GFR calc non Af Amer: 86 mL/min/{1.73_m2} (ref >59)
Globulin, Total: 1.5 g/dL (ref 1.5–4.5)
Glucose: 82 mg/dL (ref 65–99)
Potassium: 4.5 mmol/L (ref 3.5–5.2)
Sodium: 140 mmol/L (ref 134–144)
Total Protein: 6.1 g/dL (ref 6.0–8.5)

## 2020-04-21 LAB — CBC
Hematocrit: 38.9 % (ref 34.0–46.6)
Hemoglobin: 12.6 g/dL (ref 11.1–15.9)
MCH: 29.6 pg (ref 26.6–33.0)
MCHC: 32.4 g/dL (ref 31.5–35.7)
MCV: 91 fL (ref 79–97)
Platelets: 239 10*3/uL (ref 150–450)
RBC: 4.26 x10E6/uL (ref 3.77–5.28)
RDW: 13 % (ref 11.7–15.4)
WBC: 4.2 10*3/uL (ref 3.4–10.8)

## 2020-04-21 LAB — VITAMIN B1: Thiamine: 118.7 nmol/L (ref 66.5–200.0)

## 2020-04-21 LAB — B12 AND FOLATE PANEL
Folate: >20 ng/mL (ref >3.0)
Vitamin B-12: 569 pg/mL (ref 232–1245)

## 2020-04-21 LAB — METHYLMALONIC ACID, SERUM: Methylmalonic Acid: 108 nmol/L (ref 0–378)

## 2020-05-19 ENCOUNTER — Other Ambulatory Visit: Payer: Self-pay

## 2020-05-19 ENCOUNTER — Ambulatory Visit
Admission: RE | Admit: 2020-05-19 | Discharge: 2020-05-19 | Disposition: A | Discharge status: Home / Self Care | Payer: BC Managed Care – PPO | Source: Ambulatory Visit | Attending: Hematology and Oncology | Admitting: Hematology and Oncology

## 2020-05-19 DIAGNOSIS — Z853 Personal history of malignant neoplasm of breast: Secondary | ICD-10-CM

## 2020-05-19 DIAGNOSIS — Z9889 Other specified postprocedural states: Secondary | ICD-10-CM

## 2020-06-11 ENCOUNTER — Ambulatory Visit: Payer: BC Managed Care – PPO | Admitting: Hematology and Oncology

## 2020-06-18 ENCOUNTER — Ambulatory Visit: Payer: BC Managed Care – PPO | Admitting: Hematology and Oncology

## 2020-06-23 NOTE — Progress Notes (Signed)
Attempted to do a MyChart virtual visit.  He did not work therefore we are doing a telephone visit  Patient Care Team: [name], MD as PCP - General (Family Medicine) Thurnell Lose, MD as Consulting Physician (Obstetrics and Gynecology) [name], MD as Consulting Physician (General Surgery) Nicholas Lose, MD as Consulting Physician (Hematology and Oncology) Thea Silversmith, MD as Consulting Physician (Radiation Oncology) Thea Silversmith, MD as Consulting Physician (Radiation Oncology) Mauro Kaufmann, RN as Registered Nurse Rockwell Germany, RN as Registered Nurse Sylvan Cheese, NP as Nurse Practitioner (Nurse Practitioner)  DIAGNOSIS:    ICD-10-CM   1. Malignant neoplasm of lower-inner quadrant of right breast of female, estrogen receptor positive ([place])  C50.311    Z17.0     SUMMARY OF ONCOLOGIC HISTORY: Oncology History  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 female breast ([place])  03/04/2015 Mammogram   Right breast possible distortion, ultrasound 03/11/2015 suspicious mass right breast 6:00 irregular hypoechoic mass 0.7 x 0.6 x 0.6 cm, adjacent to this innumerable cysts and clusters of cysts measuring 0.7 cm   03/13/2015 Initial Biopsy   Right breast biopsy: Invasive ductal carcinoma grade 1, ER+ (100%), PR+ (70%),  HER-2 negative (ratio 1.44),  Ki-67 5%   03/13/2015 Clinical Stage   Stage IA: T1b N0   03/26/2015 Surgery   Rt Lumpectomy: 1.8 cm IDC grade 1, 0/6 LN negative, ER+ 100%, PR+ 70%, Her 2 Neg (ratio 1.35), Ki 67: 5%   03/26/2015 Pathologic Stage   Stage IA: pT1c pN0   03/26/2015 Oncotype testing   RS 16 (10% ROR)   05/21/2015 - 06/09/2015 Radiation Therapy   Adjuvant RT to right breast: 32.04 Gy over 12 fractions; treatment stopped because of wound infection   08/26/2015 -  Anti-estrogen oral therapy   Anastrozole 1 mg daily   09/09/2015 Survivorship   Survivorship visit completed and copy of care plan given to patient      CHIEF COMPLIANT: Follow-up of breast cancer on anastrozole  INTERVAL HISTORY: Candace Campbell is a 64 y.o. with above-mentioned history of right breast cancer treated with lumpectomy, radiation, and who is currently on anastrozole. Mammogram on 05/19/20 showed no evidence of malignancy bilaterally. She presents to the clinic today for annual follow-up.   She has completed 5 years of therapy and experiencing diffuse muscle aches and pains and would like to try something else.  She came in contact with someone who had Covid and therefore she decided not to come for the clinic appointment today.  She has a slight cold symptom.  She is getting Covid tested today.  ALLERGIES:  is allergic to latex, clindamycin/lincomycin, peanut-containing drug products, sulfa antibiotics, and iodine.  MEDICATIONS:  Current Outpatient Medications  Medication Sig Dispense Refill  . anastrozole (ARIMIDEX) 1 MG tablet Take 1 tablet (1 mg total) by mouth daily. 90 tablet 3  . azelastine (ASTELIN) 0.1 % nasal spray U 1 SPR IEN BID    . busPIRone (BUSPAR) 15 MG tablet TK 1 AND 1/2 T PO AM AND TK 2 TS QHS FOR ANXIETY PRN. MAY TAKE NOON DOSE IF NEEDED FOR SEVERE ANXIETY    . Calcium-Magnesium-Vitamin D (CALCIUM 500 PO) Take 1 tablet by mouth daily.    . clotrimazole (LOTRIMIN) 1 % cream APPLY TO AFFECTED AREA BID    . Denosumab (PROLIA [place]) Inject into the skin every 6 (six) months.    . Dexlansoprazole (DEXILANT PO) Take 1 tablet by mouth daily.    Marland Kitchen lamoTRIgine (  LAMICTAL) 200 MG tablet Take 200 mg by mouth at bedtime.     . magnesium oxide (MAG-OX) 400 (241.3 Mg) MG tablet Take 1 tablet (400 mg total) by mouth daily.    . Multiple Vitamin (MULTIVITAMIN WITH MINERALS) TABS tablet Take 1 tablet by mouth daily.    Marland Kitchen omeprazole-sodium bicarbonate (ZEGERID) 40-1100 MG capsule Take 1 capsule by mouth at bedtime.    . polyethylene glycol (MIRALAX / GLYCOLAX) packet Take 17 g by mouth daily as needed.    . TRAZODONE HCL PO Take 50  mg by mouth at bedtime as needed.     . Vitamin D, Cholecalciferol, 1000 units CAPS Take 1 tablet by mouth daily.    Marland Kitchen vortioxetine HBr (TRINTELLIX) 20 MG TABS tablet Take 1 tablet (20 mg total) by mouth daily. 30 tablet    No current facility-administered medications for this visit.    PHYSICAL EXAMINATION: ECOG PERFORMANCE STATUS: 1 - Symptomatic but completely ambulatory  LABORATORY DATA:  I have reviewed the data as listed CMP Latest Ref Rng & Units 04/15/2020 06/09/2018 12/09/2017  Glucose 65 - 99 mg/dL 82 78 83  BUN 8 - 27 mg/dL '16 17 12  ' Creatinine 0.57 - 1.00 mg/dL 0.75 0.86 0.84  Sodium 134 - 144 mmol/L 140 140 141  Potassium 3.5 - 5.2 mmol/L 4.5 4.6 4.1  Chloride 96 - 106 mmol/L 101 105 104  CO2 20 - 29 mmol/L '28 28 28  ' Calcium 8.7 - 10.3 mg/dL 9.3 9.1 9.1  Total Protein 6.0 - 8.5 g/dL 6.1 6.5 6.7  Total Bilirubin 0.0 - 1.2 mg/dL 0.3 0.4 0.4  Alkaline Phos 44 - 121 IU/L 48 44 43  AST 0 - 40 IU/L '24 19 16  ' ALT 0 - 32 IU/L '17 15 11    ' Lab Results  Component Value Date   WBC 4.2 04/15/2020   HGB 12.6 04/15/2020   HCT 38.9 04/15/2020   MCV 91 04/15/2020   PLT 239 04/15/2020   NEUTROABS 3.5 06/09/2018    ASSESSMENT & PLAN:  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 female breast Rt Lumpectomy 03/26/15: 1.8 cm IDC grade 1, 0/6 LN negative, Er 100%, PR 70%, Her 2 Neg, Ki 67: 5% T1cN0 Stage 1A Oncotype DX score 16, 10% ROR Radiation therapy started 05/25/2015 discontinued after 12 fractions because of nonhealing wound infection  Current treatment: Adjuvant antiestrogen therapy with anastrozole 1 mg daily 5 years started 08/26/2015 Patient lost 20 pounds and is feeling great. Anastrozole toxicities: Diffuse muscle aches and pains.  She would like to consider alternate antiestrogen therapy for the last 2 years.  We discussed the pros and cons of letrozole and we will initiate her therapy in a couple of weeks.  High bleeding risk/poor wound healing:Healed  completely Severe osteopeniaT score -2.4: Currently onProliawith calcium and vitamin D (with her Rheumatologist).  Breast cancer surveillance: 1.Breast exam12/21/2021: Benign 2.Mammogram11/15/2021: No evidence of malignancy, breast density category C Contact with someone who had COVID-19: Patient is being cautious and checking her self today I will connect with her in 1 month with a virtual visit/telephone visit to discuss tolerance to letrozole therapy.    No orders of the defined types were placed in this encounter.  The patient has a good understanding of the overall plan. she agrees with it. she will call with any problems that may develop before the next visit here.  Total time spent: 20 mins including face to face time and time spent for planning, charting and coordination  of care  Nicholas Lose, MD 06/24/2020  Julious Oka Dorshimer, am acting as scribe for Dr. Nicholas Lose.  I have reviewed the above documentation for accuracy and completeness, and I agree with the above.

## 2020-06-24 ENCOUNTER — Inpatient Hospital Stay: Payer: BC Managed Care – PPO | Attending: Hematology and Oncology | Admitting: Hematology and Oncology

## 2020-06-24 DIAGNOSIS — C50311 Malignant neoplasm of lower-inner quadrant of right female breast: Secondary | ICD-10-CM

## 2020-06-24 DIAGNOSIS — Z170 Estrogen receptor positive status [ER+]: Secondary | ICD-10-CM

## 2020-06-24 MED ORDER — LETROZOLE 2.5 MG PO TABS: 2.5000 mg | ORAL_TABLET | Freq: Every day | ORAL | 0 refills | Status: DC

## 2020-06-24 NOTE — Assessment & Plan Note (Signed)
Rt Lumpectomy 03/26/15: 1.8 cm IDC grade 1, 0/6 LN negative, Er 100%, PR 70%, Her 2 Neg, Ki 67: 5% T1cN0 Stage 1A Oncotype DX score 16, 10% ROR Radiation therapy started 05/25/2015 discontinued after 12 fractions because of nonhealing wound infection  Current treatment: Adjuvant antiestrogen therapy with anastrozole 1 mg daily 5 years started 08/26/2015 Patient lost 20 pounds and is feeling great.  High bleeding risk/poor wound healing:Healed completely Severe osteopeniaT score -2.4: Currently onProliawith calcium and vitamin D (with her Rheumatologist).  Breast cancer surveillance: 1.Breast exam12/21/2021: Benign 2.Mammogram11/15/2021: No evidence of malignancy, breast density category C  Return to clinic in1 yearfor follow up

## 2020-06-25 ENCOUNTER — Telehealth: Payer: Self-pay | Admitting: Hematology and Oncology

## 2020-06-25 ENCOUNTER — Other Ambulatory Visit: Payer: Self-pay | Admitting: Hematology and Oncology

## 2020-06-25 DIAGNOSIS — C50311 Malignant neoplasm of lower-inner quadrant of right female breast: Secondary | ICD-10-CM

## 2020-06-25 NOTE — Telephone Encounter (Signed)
Scheduled follow-up appointment. Left message for patient to call to reschedule if date/time does not work.

## 2020-07-27 NOTE — Progress Notes (Incomplete)
  HEMATOLOGY-ONCOLOGY TELEPHONE VISIT PROGRESS NOTE  I connected with Candace Campbell on 07/27/2020 at 10:30 AM EST by telephone and verified that I am speaking with the correct person using two identifiers.  I discussed the limitations, risks, security and privacy concerns of performing an evaluation and management service by telephone and the availability of in person appointments.  I also discussed with the patient that there may be a patient responsible charge related to this service. The patient expressed understanding and agreed to proceed.   History of Present Illness: Candace Campbell is a 65 y.o. female with above-mentioned history of right breast cancer treated with lumpectomy,radiation, and whois currently on letrozole after she could not tolerate anastrozole. Mammogram on 05/19/20 showed no evidence of malignancy bilaterally. She presents over the phone for follow-up.  Oncology History  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 female breast ([place])  03/04/2015 Mammogram   Right breast possible distortion, ultrasound 03/11/2015 suspicious mass right breast 6:00 irregular hypoechoic mass 0.7 x 0.6 x 0.6 cm, adjacent to this innumerable cysts and clusters of cysts measuring 0.7 cm   03/13/2015 Initial Biopsy   Right breast biopsy: Invasive ductal carcinoma grade 1, ER+ (100%), PR+ (70%),  HER-2 negative (ratio 1.44),  Ki-67 5%   03/13/2015 Clinical Stage   Stage IA: T1b N0   03/26/2015 Surgery   Rt Lumpectomy: 1.8 cm IDC grade 1, 0/6 LN negative, ER+ 100%, PR+ 70%, Her 2 Neg (ratio 1.35), Ki 67: 5%   03/26/2015 Pathologic Stage   Stage IA: pT1c pN0   03/26/2015 Oncotype testing   RS 16 (10% ROR)   05/21/2015 - 06/09/2015 Radiation Therapy   Adjuvant RT to right breast: 32.04 Gy over 12 fractions; treatment stopped because of wound infection   08/26/2015 -  Anti-estrogen oral therapy   Anastrozole 1 mg daily   09/09/2015 Survivorship   Survivorship visit completed and copy of care plan  given to patient     Observations/Objective:     Assessment Plan:  No problem-specific Assessment & Plan notes found for this encounter.    I discussed the assessment and treatment plan with the patient. The patient was provided an opportunity to ask questions and all were answered. The patient agreed with the plan and demonstrated an understanding of the instructions. The patient was advised to call back or seek an in-person evaluation if the symptoms worsen or if the condition fails to improve as anticipated.   I provided *** minutes of non-face-to-face time during this encounter.   Rulon Eisenmenger, MD 07/27/2020    Julious Oka Dorshimer, am acting as scribe for Nicholas Lose, MD.  {Add scribe attestation statement}

## 2020-07-28 ENCOUNTER — Inpatient Hospital Stay: Payer: Self-pay | Admitting: Hematology and Oncology

## 2020-07-28 ENCOUNTER — Telehealth: Payer: Self-pay | Admitting: Hematology and Oncology

## 2020-07-28 NOTE — Telephone Encounter (Signed)
I called the patient to check on how she was doing on letrozole. She has not started it yet. Therefore we will cancel today's appointment. She will start letrozole today. I will do a telephone visit in 1 month to discuss how she is tolerating it.

## 2020-07-28 NOTE — Assessment & Plan Note (Deleted)
Rt Lumpectomy 03/26/15: 1.8 cm IDC grade 1, 0/6 LN negative, Er 100%, PR 70%, Her 2 Neg, Ki 67: 5% T1cN0 Stage 1A Oncotype DX score 16, 10% ROR Radiation therapy started 05/25/2015 discontinued after 12 fractions because of nonhealing wound infection  Current treatment: Adjuvant antiestrogen therapy with anastrozole 1 mg daily 5 years started 08/26/2015 Patient lost 20 pounds and is feeling great. Anastrozole toxicities: Diffuse muscle aches and pains.  She would like to consider alternate antiestrogen therapy for the last 2 years.  We discussed the pros and cons of letrozole and we will initiate her therapy in a couple of weeks.  Letrozole toxicities:  High bleeding risk/poor wound healing:Healed completely Severe osteopeniaT score -2.4: Currently onProliawith calcium and vitamin D(with her Rheumatologist).  Breast cancer surveillance: 1.Breast exam1/24/2022: Benign 2.Mammogram11/15/2021: No evidence of malignancy, breast density category C

## 2020-07-29 ENCOUNTER — Telehealth: Payer: Self-pay | Admitting: Hematology and Oncology

## 2020-07-29 NOTE — Telephone Encounter (Signed)
Scheduled follow-up appointment per 1/24 schedule message. Patient is aware.

## 2020-08-21 ENCOUNTER — Other Ambulatory Visit: Payer: Self-pay | Admitting: Hematology and Oncology

## 2020-08-28 NOTE — Assessment & Plan Note (Signed)
Rt Lumpectomy 03/26/15: 1.8 cm IDC grade 1, 0/6 LN negative, Er 100%, PR 70%, Her 2 Neg, Ki 67: 5% T1cN0 Stage 1A Oncotype DX score 16, 10% ROR Radiation therapy started 05/25/2015 discontinued after 12 fractions because of nonhealing wound infection  Current treatment: Adjuvant antiestrogen therapy with anastrozole 1 mg daily 5 years started 08/26/2015 Patient lost 20 pounds and is feeling great. Anastrozole toxicities: Diffuse muscle aches and pains.  She would like to consider alternate antiestrogen therapy for the last 2 years.  We discussed the pros and cons of letrozole and we will initiate her therapy in a couple of weeks.  High bleeding risk/poor wound healing:Healed completely Severe osteopeniaT score -2.4: Currently onProliawith calcium and vitamin D(with her Rheumatologist).  Breast cancer surveillance: 1.Breast exam12/21/2021: Benign 2.Mammogram11/15/2021: No evidence of malignancy, breast density category C  I will connect with her in 1 month with a virtual visit/telephone visit to discuss tolerance to letrozole therapy.

## 2020-08-28 NOTE — Progress Notes (Signed)
HEMATOLOGY-ONCOLOGY TELEPHONE VISIT PROGRESS NOTE  I connected with Cornell Barman on 08/29/2020 at  9:45 AM EST by telephone and verified that I am speaking with the correct person using two identifiers.  I discussed the limitations, risks, security and privacy concerns of performing an evaluation and management service by telephone and the availability of in person appointments.  I also discussed with the patient that there may be a patient responsible charge related to this service. The patient expressed understanding and agreed to proceed.   History of Present Illness: Candace Campbell is a 65 y.o. female with above-mentioned history of right breast cancer treated with lumpectomy,radiation, and whois currently on letrozole. She presents over the phone todayfor follow-up.   She could not tolerate letrozole because of severe constipation.  She stopped it a couple of days ago and the constipation still has not been relieved.  She however did tolerated better than anastrozole with regards to joint aches and stiffness.  Oncology History  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 female breast ([place])  03/04/2015 Mammogram   Right breast possible distortion, ultrasound 03/11/2015 suspicious mass right breast 6:00 irregular hypoechoic mass 0.7 x 0.6 x 0.6 cm, adjacent to this innumerable cysts and clusters of cysts measuring 0.7 cm   03/13/2015 Initial Biopsy   Right breast biopsy: Invasive ductal carcinoma grade 1, ER+ (100%), PR+ (70%),  HER-2 negative (ratio 1.44),  Ki-67 5%   03/13/2015 Clinical Stage   Stage IA: T1b N0   03/26/2015 Surgery   Rt Lumpectomy: 1.8 cm IDC grade 1, 0/6 LN negative, ER+ 100%, PR+ 70%, Her 2 Neg (ratio 1.35), Ki 67: 5%   03/26/2015 Pathologic Stage   Stage IA: pT1c pN0   03/26/2015 Oncotype testing   RS 16 (10% ROR)   05/21/2015 - 06/09/2015 Radiation Therapy   Adjuvant RT to right breast: 32.04 Gy over 12 fractions; treatment stopped because of wound infection    08/26/2015 -  Anti-estrogen oral therapy   Anastrozole 1 mg daily   09/09/2015 Survivorship   Survivorship visit completed and copy of care plan given to patient     Observations/Objective:     Assessment Plan:  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 female breast Rt Lumpectomy 03/26/15: 1.8 cm IDC grade 1, 0/6 LN negative, Er 100%, PR 70%, Her 2 Neg, Ki 67: 5% T1cN0 Stage 1A Oncotype DX score 16, 10% ROR Radiation therapy started 05/25/2015 discontinued after 12 fractions because of nonhealing wound infection  Current treatment: Adjuvant antiestrogen therapy with anastrozole 1 mg daily 5 years started 08/26/2015 Patient lost 20 pounds and is feeling great. Anastrozole toxicities: Diffuse muscle aches and pains. Letrozole Toxicities: Severe constipation  Recommended switching to exemestane. She will call us back to tell us if exemestane is well covered by her insurance.  If it is not well covered then we will switch her to tamoxifen.  High bleeding risk/poor wound healing:Healed completely Severe osteopeniaT score -2.4: Currently onProliawith calcium and vitamin D(with her Rheumatologist).  Breast cancer surveillance: 1.Breast exam12/21/2021: Benign 2.Mammogram11/15/2021: No evidence of malignancy, breast density category C  I will connect with her in 1 month with a virtual visit/telephone visit to discuss tolerance to exemestane therapy.    I discussed the assessment and treatment plan with the patient. The patient was provided an opportunity to ask questions and all were answered. The patient agreed with the plan and demonstrated an understanding of the instructions. The patient was advised to call back or seek an in-person  evaluation if the symptoms worsen or if the condition fails to improve as anticipated.   Total time spent: 15 mins including non-face to face time and time spent for planning, charting and coordination of care  Rulon Eisenmenger,  MD 08/29/2020    I, Cloyde Reams Dorshimer, am acting as scribe for Nicholas Lose, MD.  I have reviewed the above documentation for accuracy and completeness, and I agree with the above.

## 2020-08-29 ENCOUNTER — Other Ambulatory Visit: Payer: Self-pay | Admitting: Hematology and Oncology

## 2020-08-29 ENCOUNTER — Inpatient Hospital Stay: Payer: Self-pay | Attending: Hematology and Oncology | Admitting: Hematology and Oncology

## 2020-08-29 DIAGNOSIS — C50311 Malignant neoplasm of lower-inner quadrant of right female breast: Secondary | ICD-10-CM

## 2020-08-29 DIAGNOSIS — Z170 Estrogen receptor positive status [ER+]: Secondary | ICD-10-CM

## 2020-08-29 MED ORDER — EXEMESTANE 25 MG PO TABS: 25.0000 mg | ORAL_TABLET | Freq: Every day | ORAL | 0 refills | Status: DC

## 2020-09-01 ENCOUNTER — Telehealth: Payer: Self-pay | Admitting: Hematology and Oncology

## 2020-09-01 NOTE — Telephone Encounter (Signed)
Scheduled per 2/25 los. Called pt and left a msg

## 2020-09-28 NOTE — Progress Notes (Signed)
HEMATOLOGY-ONCOLOGY TELEPHONE VISIT PROGRESS NOTE  I connected with Cornell Barman on 09/29/2020 at 11:15 AM EDT by telephone and verified that I am speaking with the correct person using two identifiers.  I discussed the limitations, risks, security and privacy concerns of performing an evaluation and management service by telephone and the availability of in person appointments.  I also discussed with the patient that there may be a patient responsible charge related to this service. The patient expressed understanding and agreed to proceed.   History of Present Illness: Candace Campbell is a 65 y.o. female with above-mentioned history of right breast cancer treated with lumpectomy,radiation, and whodiscontinued antiestrogen therapy with letrozole. She presents over the phone todayfor follow-up to discuss exemestane.  Oncology History  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 female breast ([place])  03/04/2015 Mammogram   Right breast possible distortion, ultrasound 03/11/2015 suspicious mass right breast 6:00 irregular hypoechoic mass 0.7 x 0.6 x 0.6 cm, adjacent to this innumerable cysts and clusters of cysts measuring 0.7 cm   03/13/2015 Initial Biopsy   Right breast biopsy: Invasive ductal carcinoma grade 1, ER+ (100%), PR+ (70%),  HER-2 negative (ratio 1.44),  Ki-67 5%   03/13/2015 Clinical Stage   Stage IA: T1b N0   03/26/2015 Surgery   Rt Lumpectomy: 1.8 cm IDC grade 1, 0/6 LN negative, ER+ 100%, PR+ 70%, Her 2 Neg (ratio 1.35), Ki 67: 5%   03/26/2015 Pathologic Stage   Stage IA: pT1c pN0   03/26/2015 Oncotype testing   RS 16 (10% ROR)   05/21/2015 - 06/09/2015 Radiation Therapy   Adjuvant RT to right breast: 32.04 Gy over 12 fractions; treatment stopped because of wound infection   08/26/2015 -  Anti-estrogen oral therapy   Anastrozole 1 mg daily   09/09/2015 Survivorship   Survivorship visit completed and copy of care plan given to patient     Observations/Objective:      Assessment Plan:  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 female breast Rt Lumpectomy 03/26/15: 1.8 cm IDC grade 1, 0/6 LN negative, Er 100%, PR 70%, Her 2 Neg, Ki 67: 5% T1cN0 Stage 1A Oncotype DX score 16, 10% ROR Radiation therapy started 05/25/2015 discontinued after 12 fractions because of nonhealing wound infection  Current treatment: Adjuvant antiestrogen therapy with anastrozole 1 mg daily 5 years started 08/26/2015 Patient lost 20 pounds and is feeling great. Anastrozole toxicities: Diffuse muscle aches and pains. Switched to Exemestane Exemestane Toxicities:  She is tolerating this extremely well without any problems or concerns.   Our plan is to Continue this for 2 years and then discontinue antiestrogen therapy when she completes total of 7 years of treatment.  Severe osteopeniaT score -2.4: Currently onProliawith calcium and vitamin D(with her Rheumatologist).  Breast cancer surveillance: 1.Breast exam12/21/2021: Benign 2.Mammogram11/15/2021: No evidence of malignancy, breast density category C  RTC in 1 year  I discussed the assessment and treatment plan with the patient. The patient was provided an opportunity to ask questions and all were answered. The patient agreed with the plan and demonstrated an understanding of the instructions. The patient was advised to call back or seek an in-person evaluation if the symptoms worsen or if the condition fails to improve as anticipated.   Total time spent: 15 mins including non-face to face time and time spent for planning, charting and coordination of care  Rulon Eisenmenger, MD 09/29/2020    I, Cloyde Reams Dorshimer, am acting as scribe for Nicholas Lose, MD.  I have reviewed  the above documentation for accuracy and completeness, and I agree with the above.

## 2020-09-28 NOTE — Assessment & Plan Note (Signed)
Rt Lumpectomy 03/26/15: 1.8 cm IDC grade 1, 0/6 LN negative, Er 100%, PR 70%, Her 2 Neg, Ki 67: 5% T1cN0 Stage 1A Oncotype DX score 16, 10% ROR Radiation therapy started 05/25/2015 discontinued after 12 fractions because of nonhealing wound infection  Current treatment: Adjuvant antiestrogen therapy with anastrozole 1 mg daily 5 years started 08/26/2015 Patient lost 20 pounds and is feeling great. Anastrozole toxicities: Diffuse muscle aches and pains. Switched to Exemestane Exemestane Toxicities:    Severe osteopeniaT score -2.4: Currently onProliawith calcium and vitamin D(with her Rheumatologist).  Breast cancer surveillance: 1.Breast exam12/21/2021: Benign 2.Mammogram11/15/2021: No evidence of malignancy, breast density category C  RTC in 1 year

## 2020-09-29 ENCOUNTER — Inpatient Hospital Stay: Payer: Self-pay | Attending: Hematology and Oncology | Admitting: Hematology and Oncology

## 2020-09-29 DIAGNOSIS — C50311 Malignant neoplasm of lower-inner quadrant of right female breast: Secondary | ICD-10-CM

## 2020-09-29 DIAGNOSIS — Z170 Estrogen receptor positive status [ER+]: Secondary | ICD-10-CM

## 2020-09-29 MED ORDER — EXEMESTANE 25 MG PO TABS: 25.0000 mg | ORAL_TABLET | Freq: Every day | ORAL | 3 refills | Status: DC

## 2020-12-11 ENCOUNTER — Other Ambulatory Visit: Payer: Self-pay | Admitting: Rheumatology

## 2020-12-11 DIAGNOSIS — M8580 Other specified disorders of bone density and structure, unspecified site: Secondary | ICD-10-CM

## 2021-04-07 ENCOUNTER — Other Ambulatory Visit (HOSPITAL_BASED_OUTPATIENT_CLINIC_OR_DEPARTMENT_OTHER): Payer: Self-pay | Admitting: Family Medicine

## 2021-04-07 DIAGNOSIS — E7800 Pure hypercholesterolemia, unspecified: Secondary | ICD-10-CM

## 2021-04-21 ENCOUNTER — Ambulatory Visit (HOSPITAL_BASED_OUTPATIENT_CLINIC_OR_DEPARTMENT_OTHER)
Admission: RE | Admit: 2021-04-21 | Discharge: 2021-04-21 | Disposition: A | Discharge status: Home / Self Care | Payer: Self-pay | Source: Ambulatory Visit | Attending: Family Medicine | Admitting: Family Medicine

## 2021-04-21 ENCOUNTER — Other Ambulatory Visit: Payer: Self-pay

## 2021-04-21 DIAGNOSIS — E7800 Pure hypercholesterolemia, unspecified: Secondary | ICD-10-CM | POA: Insufficient documentation

## 2021-04-27 ENCOUNTER — Other Ambulatory Visit: Payer: Self-pay | Admitting: Obstetrics and Gynecology

## 2021-04-27 DIAGNOSIS — Z1231 Encounter for screening mammogram for malignant neoplasm of breast: Secondary | ICD-10-CM

## 2021-05-12 ENCOUNTER — Other Ambulatory Visit: Payer: Self-pay

## 2021-05-12 ENCOUNTER — Encounter: Payer: Self-pay | Admitting: Cardiology

## 2021-05-12 ENCOUNTER — Ambulatory Visit: Payer: BC Managed Care – PPO | Admitting: Cardiology

## 2021-05-12 ENCOUNTER — Encounter: Payer: Self-pay | Admitting: Hematology and Oncology

## 2021-05-12 VITALS — BP 131/84 | HR 69 | Resp 16 | Ht 64.0 in | Wt 139.0 lb

## 2021-05-12 DIAGNOSIS — R931 Abnormal findings on diagnostic imaging of heart and coronary circulation: Secondary | ICD-10-CM

## 2021-05-12 DIAGNOSIS — E7800 Pure hypercholesterolemia, unspecified: Secondary | ICD-10-CM

## 2021-05-12 DIAGNOSIS — G4733 Obstructive sleep apnea (adult) (pediatric): Secondary | ICD-10-CM

## 2021-05-12 DIAGNOSIS — I2510 Atherosclerotic heart disease of native coronary artery without angina pectoris: Secondary | ICD-10-CM

## 2021-05-12 MED ORDER — ASPIRIN EC 81 MG PO TBEC: 81.0000 mg | DELAYED_RELEASE_TABLET | Freq: Every day | ORAL | 11 refills | Status: AC

## 2021-05-12 NOTE — Progress Notes (Signed)
Date:  05/12/2021   ID:  Cornell Barman, DOB 1956/02/09, MRN [id number]  PCP:  [name], MD  Cardiologist:  Rex Kras, DO, [facility] (established care 05/12/21 )  REASON FOR CONSULT: CAD evaluation  REQUESTING PHYSICIAN:  [name], MD [place],  [place] [id number]  Chief Complaint  Patient presents with   Coronary Artery Disease   New Patient (Initial Visit)    HPI  Candace Campbell is a 65 y.o. Caucasian female who presents to the office with a chief complaint of "coronary calcification." Patient's past medical history and cardiovascular risk factors include: History of right-sided breast cancer treated with lumpectomy/radiation, OSA w/ dental appliance, hyperlipidemia, severe coronary artery calcification, postmenopausal female.  She is referred to the office at the request of [name], * for evaluation of Atherosclerotic heart disease of native coronary artery without angina pectoris.  Patient has had a known history of hyperlipidemia and chose to manage it medically.  However, recently at her PCPs office the shared decision was to proceed with coronary calcium score for further risk stratification.  She is noted to have severe coronary artery calcification predominantly in the LAD distribution and therefore was started on Lipitor 10 mg p.o. nightly.  And now referred to cardiology for further evaluation and management.  Clinically patient states that she is doing well from a cardiovascular standpoint.  She denies any chest pain at rest or with effort related activities.  Patient has been walking her dog regularly approximately 2 miles on a daily basis.  She has not noticed any significant abrupt change in her physical endurance but does note the inability to walk as fast as she did in the past.  She does not have any effort related chest pain or shortness of breath.  No family history of premature coronary disease or sudden cardiac  death.  ALLERGIES: Allergies  Allergen Reactions   Latex Swelling   Clindamycin/Lincomycin Hives   Peanut-Containing Drug Products Other (See Comments)    Headache/migraine   Sulfa Antibiotics Other (See Comments)    UNSPECIFIED REACTION Pt does not remember the symptoms "They told me I should not take it anymore".     Iodine Swelling and Rash    Including Betadine     MEDICATION LIST PRIOR TO VISIT: Current Meds  Medication Sig   aspirin EC 81 MG tablet Take 1 tablet (81 mg total) by mouth daily. Swallow whole.   atorvastatin (LIPITOR) 10 MG tablet Take 10 mg by mouth daily.   azelastine (ASTELIN) 0.1 % nasal spray U 1 SPR IEN BID   budesonide (ENTOCORT EC) 3 MG 24 hr capsule Take 6 mg by mouth 2 (two) times daily as needed.   busPIRone (BUSPAR) 15 MG tablet TK 1 AND 1/2 T PO AM AND TK 2 TS QHS FOR ANXIETY PRN. MAY TAKE NOON DOSE IF NEEDED FOR SEVERE ANXIETY   Calcium-Magnesium-Vitamin D (CALCIUM 500 PO) Take 1 tablet by mouth daily.   Denosumab (PROLIA [place]) Inject into the skin every 6 (six) months.   exemestane (AROMASIN) 25 MG tablet Take 1 tablet (25 mg total) by mouth daily after breakfast.   lamoTRIgine (LAMICTAL) 200 MG tablet Take 200 mg by mouth at bedtime.    Multiple Vitamin (MULTIVITAMIN WITH MINERALS) TABS tablet Take 1 tablet by mouth daily.   pantoprazole (PROTONIX) 40 MG tablet SMARTSIG:1 Tablet(s) By Mouth Morning-Evening   polyethylene glycol (MIRALAX / GLYCOLAX) packet Take 17 g by mouth daily as  needed.   Probiotic Product (ALIGN) 4 MG CAPS Take by mouth.   TRAZODONE HCL PO Take 50 mg by mouth at bedtime as needed.    Vitamin D, Cholecalciferol, 1000 units CAPS Take 1 tablet by mouth daily.   vortioxetine HBr (TRINTELLIX) 20 MG TABS tablet Take 1 tablet (20 mg total) by mouth daily.   Zinc 50 MG TABS Take by mouth.     PAST MEDICAL HISTORY: Past Medical History:  Diagnosis Date   Anxiety    Blood dyscrasia    states daughter has Von Willebrand and she  was tested 03-19-15 by Dr Lindi Adie   Breast cancer [facility]) 03/13/2015   right breast    Coronary atherosclerosis due to calcified coronary lesion    Depression    GERD (gastroesophageal reflux disease)    Headache    HX MIGRAINES   History of kidney stones    Hyperlipidemia    OSA (obstructive sleep apnea)    Osteopenia    per notes from [facility] history of radiation therapy    PONV (postoperative nausea and vomiting)     PAST SURGICAL HISTORY: Past Surgical History:  Procedure Laterality Date   ABDOMINAL HYSTERECTOMY     BREAST BIOPSY Right 2016   BREAST LUMPECTOMY Right 2016   BREAST LUMPECTOMY WITH RADIOACTIVE SEED AND SENTINEL LYMPH NODE BIOPSY Right 03/26/2015   Procedure: BREAST LUMPECTOMY WITH RADIOACTIVE SEED AND SENTINEL LYMPH NODE BIOPSY;  Surgeon: [name], MD;  Location: [facility];  Service: General;  Laterality: Right;   BUNIONECTOMY     BILAT   LUMBAR LAMINECTOMY/DECOMPRESSION MICRODISCECTOMY Right 03/03/2016   Procedure: RIGHT LUMBAR FOUR-FIVE LUMBAR LAMINECTOMYAND MICRODISCECTOMY;  Surgeon: Jovita Gamma, MD;  Location: [facility];  Service: Neurosurgery;  Laterality: Right;  right    FAMILY HISTORY: The patient family history includes Breast cancer (age of onset: 20) in her maternal grandmother; Dementia in her mother.  SOCIAL HISTORY:  The patient  reports that she has never smoked. She has never used smokeless tobacco. She reports current alcohol use of about 1.0 standard drink per week. She reports that she does not use drugs.  REVIEW OF SYSTEMS: Review of Systems  Constitutional: Negative for chills and fever.  HENT:  Negative for hoarse voice and nosebleeds.   Eyes:  Negative for discharge, double vision and pain.  Cardiovascular:  Negative for chest pain, claudication, dyspnea on exertion, leg swelling, near-syncope, orthopnea, palpitations, paroxysmal nocturnal dyspnea and syncope.  Respiratory:   Negative for hemoptysis and shortness of breath.   Musculoskeletal:  Negative for muscle cramps and myalgias.  Gastrointestinal:  Negative for abdominal pain, constipation, diarrhea, hematemesis, hematochezia, melena, nausea and vomiting.  Neurological:  Negative for dizziness and light-headedness.   PHYSICAL EXAM: Vitals with BMI 05/12/2021 04/15/2020 06/11/2019  Height 5\' 4"  5\' 4"  5' 4.75"  Weight 139 lbs 130 lbs 129 lbs  BMI 23.85 37.9 02.40  Systolic 973 532 992  Diastolic 84 80 86  Pulse 69 68 99    CONSTITUTIONAL: Well-developed and well-nourished. No acute distress.  SKIN: Skin is warm and dry. No rash noted. No cyanosis. No pallor. No jaundice HEAD: Normocephalic and atraumatic.  EYES: No scleral icterus MOUTH/THROAT: Moist oral membranes.  NECK: No JVD present. No thyromegaly noted. No carotid bruits  LYMPHATIC: No visible cervical adenopathy.  CHEST Normal respiratory effort. No intercostal retractions  LUNGS: Clear to auscultation bilaterally.  No stridor. No wheezes. No rales.  CARDIOVASCULAR: Regular rate  and rhythm, positive S1-S2, no murmurs rubs or gallops appreciated. ABDOMINAL: No apparent ascites.  EXTREMITIES: No peripheral edema, warm to touch, 2+ DP and PT pulses HEMATOLOGIC: No significant bruising NEUROLOGIC: Oriented to person, place, and time. Nonfocal. Normal muscle tone.  PSYCHIATRIC: Normal mood and affect. Normal behavior. Cooperative  CARDIAC DATABASE: EKG: 05/12/2021: Normal sinus rhythm, 66 bpm, without underlying injury pattern.  Echocardiogram: No results found for this or any previous visit from the past 1095 days.   Stress Testing: No results found for this or any previous visit from the past 1095 days.  Heart Catheterization: None  CT Cardiac Scoring:  04/21/2021 Left main: 0. LAD 390. LCx: 30. RCA 0 Total CAC 420AU, 95 th percentile for patient's age, sex, and race.   LABORATORY DATA: CBC Latest Ref Rng & Units 04/15/2020  06/09/2018 12/09/2017  WBC 3.4 - 10.8 x10E3/uL 4.2 5.1 3.6(L)  Hemoglobin 11.1 - 15.9 g/dL 12.6 12.9 12.9  Hematocrit 34.0 - 46.6 % 38.9 40.4 39.5  Platelets 150 - 450 x10E3/uL 239 194 208    CMP Latest Ref Rng & Units 04/15/2020 06/09/2018 12/09/2017  Glucose 65 - 99 mg/dL 82 78 83  BUN 8 - 27 mg/dL 16 17 12   Creatinine 0.57 - 1.00 mg/dL 0.75 0.86 0.84  Sodium 134 - 144 mmol/L 140 140 141  Potassium 3.5 - 5.2 mmol/L 4.5 4.6 4.1  Chloride 96 - 106 mmol/L 101 105 104  CO2 20 - 29 mmol/L 28 28 28   Calcium 8.7 - 10.3 mg/dL 9.3 9.1 9.1  Total Protein 6.0 - 8.5 g/dL 6.1 6.5 6.7  Total Bilirubin 0.0 - 1.2 mg/dL 0.3 0.4 0.4  Alkaline Phos 44 - 121 IU/L 48 44 43  AST 0 - 40 IU/L 24 19 16   ALT 0 - 32 IU/L 17 15 11     Lipid Panel  Lab Results  Component Value Date   CHOL 218 (H) 12/09/2017   HDL 65 12/09/2017   LDLCALC 136 (H) 12/09/2017   TRIG 84 12/09/2017   CHOLHDL 3.4 12/09/2017    No components found for: NTPROBNP No results for input(s): PROBNP in the last 8760 hours. No results for input(s): TSH in the last 8760 hours.  BMP No results for input(s): NA, K, CL, CO2, GLUCOSE, BUN, CREATININE, CALCIUM, GFRNONAA, GFRAA in the last 8760 hours.  HEMOGLOBIN A1C No results found for: HGBA1C, MPG  External Labs: Collected: 04/03/2021 Total cholesterol 236, triglycerides 54, HDL 65, LDL 162, non-HDL 171 Sodium 141, potassium 4.7, chloride 99, bicarb 27, BUN 20, creatinine 0.88 AST 16, ALT 10, alkaline phosphatase 44  IMPRESSION:    ICD-10-CM   1. Coronary atherosclerosis due to calcified coronary lesion  I25.10 EKG 12-Lead   I25.84 aspirin EC 81 MG tablet    PCV ECHOCARDIOGRAM COMPLETE    PCV MYOCARDIAL PERFUSION WO LEXISCAN    2. Agatston coronary artery calcium score greater than 400  R93.1 PCV ECHOCARDIOGRAM COMPLETE    PCV MYOCARDIAL PERFUSION WO LEXISCAN    3. Pure hypercholesterolemia  E78.00     4. OSA (obstructive sleep apnea)  G47.33        RECOMMENDATIONS: DORIANA MAZURKIEWICZ is a 65 y.o. Caucasian female whose past medical history and cardiac risk factors include: History of right-sided breast cancer treated with lumpectomy/radiation, OSA w/ dental appliance, hyperlipidemia, severe coronary artery calcification, postmenopausal female.  Coronary atherosclerosis due to calcified coronary lesion Total CAC 420 AU, 95th percentile EKG: Nonischemic Chest pain: None 10-year estimated risk of ASCVD:  5.3% No effort related anginal discomfort.  However as noted change in her functional capacity as she is not able to walk as fast as she did before as discussed above. Echocardiogram will be ordered to evaluate for structural heart disease and left ventricular systolic function. Plan exercise nuclear stress test to evaluate for functional status and reversible ischemia Add aspirin 81 mg p.o. daily. Continue statin therapy.  Pure hypercholesterolemia Currently on Lipitor.   She denies myalgia or other side effects. Most recent lipids dated 04/03/2021 reviewed as noted above. Currently managed by primary care provider.  Recommend rechecking fasting lipid profile in 6 weeks after initiating Lipitor.  Patient states that she will discuss it with PCP.  OSA (obstructive sleep apnea) Currently compliant with her dental appliance.  FINAL MEDICATION LIST END OF ENCOUNTER: Meds ordered this encounter  Medications   aspirin EC 81 MG tablet    Sig: Take 1 tablet (81 mg total) by mouth daily. Swallow whole.    Dispense:  30 tablet    Refill:  11     Medications Discontinued During This Encounter  Medication Reason   clotrimazole (LOTRIMIN) 1 % cream Error   Dexlansoprazole (DEXILANT PO) Error   magnesium oxide (MAG-OX) 400 (241.3 Mg) MG tablet Error   omeprazole-sodium bicarbonate (ZEGERID) 40-1100 MG capsule Error     Current Outpatient Medications:    aspirin EC 81 MG tablet, Take 1 tablet (81 mg total) by mouth daily. Swallow whole., Disp: 30 tablet, Rfl: 11    atorvastatin (LIPITOR) 10 MG tablet, Take 10 mg by mouth daily., Disp: , Rfl:    azelastine (ASTELIN) 0.1 % nasal spray, U 1 SPR IEN BID, Disp: , Rfl:    budesonide (ENTOCORT EC) 3 MG 24 hr capsule, Take 6 mg by mouth 2 (two) times daily as needed., Disp: , Rfl:    busPIRone (BUSPAR) 15 MG tablet, TK 1 AND 1/2 T PO AM AND TK 2 TS QHS FOR ANXIETY PRN. MAY TAKE NOON DOSE IF NEEDED FOR SEVERE ANXIETY, Disp: , Rfl:    Calcium-Magnesium-Vitamin D (CALCIUM 500 PO), Take 1 tablet by mouth daily., Disp: , Rfl:    Denosumab (PROLIA [place]), Inject into the skin every 6 (six) months., Disp: , Rfl:    exemestane (AROMASIN) 25 MG tablet, Take 1 tablet (25 mg total) by mouth daily after breakfast., Disp: 90 tablet, Rfl: 3   lamoTRIgine (LAMICTAL) 200 MG tablet, Take 200 mg by mouth at bedtime. , Disp: , Rfl:    Multiple Vitamin (MULTIVITAMIN WITH MINERALS) TABS tablet, Take 1 tablet by mouth daily., Disp: , Rfl:    pantoprazole (PROTONIX) 40 MG tablet, SMARTSIG:1 Tablet(s) By Mouth Morning-Evening, Disp: , Rfl:    polyethylene glycol (MIRALAX / GLYCOLAX) packet, Take 17 g by mouth daily as needed., Disp: , Rfl:    Probiotic Product (ALIGN) 4 MG CAPS, Take by mouth., Disp: , Rfl:    TRAZODONE HCL PO, Take 50 mg by mouth at bedtime as needed. , Disp: , Rfl:    Vitamin D, Cholecalciferol, 1000 units CAPS, Take 1 tablet by mouth daily., Disp: , Rfl:    vortioxetine HBr (TRINTELLIX) 20 MG TABS tablet, Take 1 tablet (20 mg total) by mouth daily., Disp: 30 tablet, Rfl:    Zinc 50 MG TABS, Take by mouth., Disp: , Rfl:   Orders Placed This Encounter  Procedures   PCV MYOCARDIAL PERFUSION WO LEXISCAN   EKG 12-Lead   PCV ECHOCARDIOGRAM COMPLETE    There are no Patient  Instructions on file for this visit.   --Continue cardiac medications as reconciled in final medication list. --Return in about 6 weeks (around 06/23/2021) for Follow up, Coronary artery calcification, Review test results. Or sooner if  needed. --Continue follow-up with your primary care physician regarding the management of your other chronic comorbid conditions.  Patient's questions and concerns were addressed to her satisfaction. She voices understanding of the instructions provided during this encounter.   This note was created using a voice recognition software as a result there may be grammatical errors inadvertently enclosed that do not reflect the nature of this encounter. Every attempt is made to correct such errors.  Rex Kras, [place], [facility]  Pager: [phone] Office: [phone]

## 2021-05-26 ENCOUNTER — Ambulatory Visit: Payer: BC Managed Care – PPO

## 2021-05-26 ENCOUNTER — Other Ambulatory Visit: Payer: Self-pay

## 2021-05-26 DIAGNOSIS — R931 Abnormal findings on diagnostic imaging of heart and coronary circulation: Secondary | ICD-10-CM

## 2021-05-26 DIAGNOSIS — I2510 Atherosclerotic heart disease of native coronary artery without angina pectoris: Secondary | ICD-10-CM

## 2021-06-02 ENCOUNTER — Ambulatory Visit
Admission: RE | Admit: 2021-06-02 | Discharge: 2021-06-02 | Disposition: A | Discharge status: Home / Self Care | Payer: BC Managed Care – PPO | Source: Ambulatory Visit | Attending: Rheumatology | Admitting: Rheumatology

## 2021-06-02 ENCOUNTER — Ambulatory Visit
Admission: RE | Admit: 2021-06-02 | Discharge: 2021-06-02 | Disposition: A | Discharge status: Home / Self Care | Payer: BC Managed Care – PPO | Source: Ambulatory Visit | Attending: Obstetrics and Gynecology | Admitting: Obstetrics and Gynecology

## 2021-06-02 ENCOUNTER — Encounter: Payer: Self-pay | Admitting: Hematology and Oncology

## 2021-06-02 DIAGNOSIS — M8580 Other specified disorders of bone density and structure, unspecified site: Secondary | ICD-10-CM

## 2021-06-02 DIAGNOSIS — Z1231 Encounter for screening mammogram for malignant neoplasm of breast: Secondary | ICD-10-CM

## 2021-06-08 NOTE — Progress Notes (Signed)
Called patient, NA, LMAM

## 2021-06-09 NOTE — Progress Notes (Signed)
Called and spoke to pt, pt voiced understanding.

## 2021-06-23 ENCOUNTER — Other Ambulatory Visit: Payer: Self-pay

## 2021-06-23 ENCOUNTER — Ambulatory Visit: Payer: BC Managed Care – PPO | Admitting: Cardiology

## 2021-06-23 ENCOUNTER — Encounter: Payer: Self-pay | Admitting: Cardiology

## 2021-06-23 VITALS — BP 134/77 | HR 71 | Resp 16 | Ht 64.0 in | Wt 143.8 lb

## 2021-06-23 DIAGNOSIS — R931 Abnormal findings on diagnostic imaging of heart and coronary circulation: Secondary | ICD-10-CM

## 2021-06-23 DIAGNOSIS — E7800 Pure hypercholesterolemia, unspecified: Secondary | ICD-10-CM

## 2021-06-23 DIAGNOSIS — I2584 Coronary atherosclerosis due to calcified coronary lesion: Secondary | ICD-10-CM

## 2021-06-23 DIAGNOSIS — Z712 Person consulting for explanation of examination or test findings: Secondary | ICD-10-CM

## 2021-06-23 DIAGNOSIS — G4733 Obstructive sleep apnea (adult) (pediatric): Secondary | ICD-10-CM

## 2021-06-23 NOTE — Progress Notes (Signed)
Date:  06/23/2021   ID:  Candace Campbell, DOB 03/21/1956, MRN [id number]  PCP:  [name], MD  Cardiologist:  Rex Kras, DO, [facility] (established care 05/12/21 )  Date: 06/23/21 Last Office Visit: 05/12/2021  Chief Complaint  Patient presents with   Coronary artery calcification   Results   Follow-up    HPI  Candace Campbell is a 65 y.o. Caucasian female who presents to the office with a chief complaint of " discussed work-up for coronary artery calcification." Patient's past medical history and cardiovascular risk factors include: History of right-sided breast cancer treated with lumpectomy/radiation, OSA w/ dental appliance, hyperlipidemia, severe coronary artery calcification, postmenopausal female.  She is referred to the office at the request of [name], * for evaluation of Atherosclerotic heart disease of native coronary artery without angina pectoris.  However, the shared decision with her PCP was to undergo a coronary calcium score to further risk stratify her.  She was noted to have severe coronary artery calcification predominantly in the LAD distribution and thereafter started on Lipitor 10 mg p.o. nightly and referred to cardiology for further evaluation.  At the last office visit patient was denying any anginal discomfort.  However given her coronary calcification predominantly in the LAD, her other chronic comorbid conditions, and advanced age to the shared decision was to proceed with echocardiogram and stress test.  Results reviewed with her in great detail and noted below for further reference.  Clinically she remains asymptomatic from a cardiovascular standpoint.  Overall still has good functional capacity for age and walks her dog regularly approximately 2 miles.  No family history of premature coronary disease or sudden cardiac death.  ALLERGIES: Allergies  Allergen Reactions   Latex Swelling   Clindamycin/Lincomycin Hives   Peanut-Containing  Drug Products Other (See Comments)    Headache/migraine   Sulfa Antibiotics Other (See Comments)    UNSPECIFIED REACTION Pt does not remember the symptoms "They told me I should not take it anymore".     Iodine Swelling and Rash    Including Betadine     MEDICATION LIST PRIOR TO VISIT: Current Meds  Medication Sig   aspirin EC 81 MG tablet Take 1 tablet (81 mg total) by mouth daily. Swallow whole.   atorvastatin (LIPITOR) 10 MG tablet Take 10 mg by mouth daily.   azelastine (ASTELIN) 0.1 % nasal spray U 1 SPR IEN BID   budesonide (ENTOCORT EC) 3 MG 24 hr capsule Take 6 mg by mouth 2 (two) times daily as needed.   busPIRone (BUSPAR) 15 MG tablet TK 1 AND 1/2 T PO AM AND TK 2 TS QHS FOR ANXIETY PRN. MAY TAKE NOON DOSE IF NEEDED FOR SEVERE ANXIETY   Calcium-Magnesium-Vitamin D (CALCIUM 500 PO) Take 1 tablet by mouth daily.   Denosumab (PROLIA [place]) Inject into the skin every 6 (six) months.   exemestane (AROMASIN) 25 MG tablet Take 1 tablet (25 mg total) by mouth daily after breakfast.   lamoTRIgine (LAMICTAL) 200 MG tablet Take 200 mg by mouth at bedtime.    Multiple Vitamin (MULTIVITAMIN WITH MINERALS) TABS tablet Take 1 tablet by mouth daily.   polyethylene glycol (MIRALAX / GLYCOLAX) packet Take 17 g by mouth daily as needed.   TRAZODONE HCL PO Take 50 mg by mouth at bedtime as needed.    Vitamin D, Cholecalciferol, 1000 units CAPS Take 1 tablet by mouth daily.   Zinc 50 MG TABS Take by mouth.     PAST  MEDICAL HISTORY: Past Medical History:  Diagnosis Date   Anxiety    Blood dyscrasia    states daughter has Von Willebrand and she was tested 03-19-15 by Dr Lindi Adie   Breast [facility] 03/13/2015   right breast    Coronary atherosclerosis due to calcified coronary lesion    Depression    GERD (gastroesophageal reflux disease)    Headache    HX MIGRAINES   History of kidney stones    Hyperlipidemia    OSA (obstructive sleep apnea)    Osteopenia    per notes from [facility] history of radiation therapy    PONV (postoperative nausea and vomiting)     PAST SURGICAL HISTORY: Past Surgical History:  Procedure Laterality Date   ABDOMINAL HYSTERECTOMY     BREAST BIOPSY Right 2016   BREAST LUMPECTOMY Right 2016   BREAST LUMPECTOMY WITH RADIOACTIVE SEED AND SENTINEL LYMPH NODE BIOPSY Right 03/26/2015   Procedure: BREAST LUMPECTOMY WITH RADIOACTIVE SEED AND SENTINEL LYMPH NODE BIOPSY;  Surgeon: [name], MD;  Location: [place];  Service: General;  Laterality: Right;   BUNIONECTOMY     BILAT   LUMBAR LAMINECTOMY/DECOMPRESSION MICRODISCECTOMY Right 03/03/2016   Procedure: RIGHT LUMBAR FOUR-FIVE LUMBAR LAMINECTOMYAND MICRODISCECTOMY;  Surgeon: Jovita Gamma, MD;  Location: [facility];  Service: Neurosurgery;  Laterality: Right;  right    FAMILY HISTORY: The patient family history includes Breast cancer (age of onset: 89) in her maternal grandmother; Dementia in her mother.  SOCIAL HISTORY:  The patient  reports that she has never smoked. She has never used smokeless tobacco. She reports current alcohol use of about 1.0 standard drink per week. She reports that she does not use drugs.  REVIEW OF SYSTEMS: Review of Systems  Constitutional: Negative for chills and fever.  HENT:  Negative for hoarse voice and nosebleeds.   Eyes:  Negative for discharge, double vision and pain.  Cardiovascular:  Negative for chest pain, claudication, dyspnea on exertion, leg swelling, near-syncope, orthopnea, palpitations, paroxysmal nocturnal dyspnea and syncope.  Respiratory:  Negative for hemoptysis and shortness of breath.   Musculoskeletal:  Negative for muscle cramps and myalgias.  Gastrointestinal:  Negative for abdominal pain, constipation, diarrhea, hematemesis, hematochezia, melena, nausea and vomiting.  Neurological:  Negative for dizziness and light-headedness.   PHYSICAL EXAM: Vitals with BMI 06/23/2021  05/12/2021 04/15/2020  Height 5\' 4"  5\' 4"  5\' 4"   Weight 143 lbs 13 oz 139 lbs 130 lbs  BMI 24.67 41.32 44.0  Systolic 102 725 366  Diastolic 77 84 80  Pulse 71 69 68    CONSTITUTIONAL: Well-developed and well-nourished. No acute distress.  SKIN: Skin is warm and dry. No rash noted. No cyanosis. No pallor. No jaundice HEAD: Normocephalic and atraumatic.  EYES: No scleral icterus MOUTH/THROAT: Moist oral membranes.  NECK: No JVD present. No thyromegaly noted. No carotid bruits  LYMPHATIC: No visible cervical adenopathy.  CHEST Normal respiratory effort. No intercostal retractions  LUNGS: Clear to auscultation bilaterally.  No stridor. No wheezes. No rales.  CARDIOVASCULAR: Regular rate and rhythm, positive S1-S2, no murmurs rubs or gallops appreciated. ABDOMINAL: Nonobese, soft, nontender, nondistended, positive bowel sounds in all 4 quadrants, no apparent ascites.  EXTREMITIES: No peripheral edema, warm to touch, 2+ DP and PT pulses HEMATOLOGIC: No significant bruising NEUROLOGIC: Oriented to person, place, and time. Nonfocal. Normal muscle tone.  PSYCHIATRIC: Normal mood and affect. Normal behavior. Cooperative  CARDIAC DATABASE: EKG: 05/12/2021: Normal sinus rhythm, 66  bpm, without underlying injury pattern.  Echocardiogram: 05/26/2021: Normal LV systolic function with visual EF 55-60%. Left ventricle cavity is normal in size. Normal left ventricular wall thickness. Normal global wall motion. Normal diastolic filling pattern, normal LAP. Mild tricuspid regurgitation. No evidence of pulmonary hypertension. No prior study for comparison.  Stress Testing: Exercise nuclear stress test  05/25/2021: Normal ECG stress. The patient exercised for 7 minutes and 20 seconds of a Bruce protocol, achieving approximately 7.05 METs. Normal BP response. No chest pain or arrhythmias noted. Myocardial perfusion is normal. Overall LV systolic function is normal without regional wall motion  abnormalities. Stress LV EF: 45%. Visually appears normal. No previous exam available for comparison. Low risk.  Heart Catheterization: None  CT Cardiac Scoring:  04/21/2021 Left main: 0. LAD 390. LCx: 30. RCA 0 Total CAC 420AU, 95 th percentile for patient's age, sex, and race.   LABORATORY DATA: CBC Latest Ref Rng & Units 04/15/2020 06/09/2018 12/09/2017  WBC 3.4 - 10.8 x10E3/uL 4.2 5.1 3.6(L)  Hemoglobin 11.1 - 15.9 g/dL 12.6 12.9 12.9  Hematocrit 34.0 - 46.6 % 38.9 40.4 39.5  Platelets 150 - 450 x10E3/uL 239 194 208    CMP Latest Ref Rng & Units 04/15/2020 06/09/2018 12/09/2017  Glucose 65 - 99 mg/dL 82 78 83  BUN 8 - 27 mg/dL 16 17 12   Creatinine 0.57 - 1.00 mg/dL 0.75 0.86 0.84  Sodium 134 - 144 mmol/L 140 140 141  Potassium 3.5 - 5.2 mmol/L 4.5 4.6 4.1  Chloride 96 - 106 mmol/L 101 105 104  CO2 20 - 29 mmol/L 28 28 28   Calcium 8.7 - 10.3 mg/dL 9.3 9.1 9.1  Total Protein 6.0 - 8.5 g/dL 6.1 6.5 6.7  Total Bilirubin 0.0 - 1.2 mg/dL 0.3 0.4 0.4  Alkaline Phos 44 - 121 IU/L 48 44 43  AST 0 - 40 IU/L 24 19 16   ALT 0 - 32 IU/L 17 15 11     Lipid Panel  Lab Results  Component Value Date   CHOL 218 (H) 12/09/2017   HDL 65 12/09/2017   LDLCALC 136 (H) 12/09/2017   TRIG 84 12/09/2017   CHOLHDL 3.4 12/09/2017    No components found for: NTPROBNP No results for input(s): PROBNP in the last 8760 hours. No results for input(s): TSH in the last 8760 hours.  BMP No results for input(s): NA, K, CL, CO2, GLUCOSE, BUN, CREATININE, CALCIUM, GFRNONAA, GFRAA in the last 8760 hours.  HEMOGLOBIN A1C No results found for: HGBA1C, MPG  External Labs: Collected: 04/03/2021 Total cholesterol 236, triglycerides 54, HDL 65, LDL 162, non-HDL 171 Sodium 141, potassium 4.7, chloride 99, bicarb 27, BUN 20, creatinine 0.88 AST 16, ALT 10, alkaline phosphatase 44  IMPRESSION:    ICD-10-CM   1. Coronary atherosclerosis due to calcified coronary lesion  I25.10    I25.84     2. [place]  coronary artery calcium score greater than 400  R93.1     3. Pure hypercholesterolemia  E78.00     4. OSA (obstructive sleep apnea)  G47.33     5. Encounter to discuss test results  Z71.2        RECOMMENDATIONS: Candace Campbell is a 65 y.o. Caucasian female whose past medical history and cardiac risk factors include: History of right-sided breast cancer treated with lumpectomy/radiation, OSA w/ dental appliance, hyperlipidemia, severe coronary artery calcification, postmenopausal female.  Coronary atherosclerosis due to calcified coronary lesion Total CAC 420 AU, 95th percentile EKG: Nonischemic Chest pain: None 10-year estimated  risk of ASCVD: 5.3% Echocardiogram: Preserved LVEF, normal diastolic function, no significant valvular heart disease. Stress test: Low risk Continue aspirin and statin therapy  Educated on importance of risk factor modifications and secondary prevention. Recommend annual follow-up visits or sooner if change in clinical status.  Pure hypercholesterolemia Currently on Lipitor.   She denies myalgia or other side effects. Most recent lipids dated 04/03/2021 reviewed as noted above. Currently managed by primary care provider.  Recommend rechecking fasting lipid profile in 6 weeks after initiating Lipitor.  Patient states that she will discuss it with PCP.  OSA (obstructive sleep apnea) Currently compliant with her dental appliance.  FINAL MEDICATION LIST END OF ENCOUNTER: No orders of the defined types were placed in this encounter.    Medications Discontinued During This Encounter  Medication Reason   pantoprazole (PROTONIX) 40 MG tablet    Probiotic Product (ALIGN) 4 MG CAPS    vortioxetine HBr (TRINTELLIX) 20 MG TABS tablet      Current Outpatient Medications:    aspirin EC 81 MG tablet, Take 1 tablet (81 mg total) by mouth daily. Swallow whole., Disp: 30 tablet, Rfl: 11   atorvastatin (LIPITOR) 10 MG tablet, Take 10 mg by mouth daily., Disp: , Rfl:     azelastine (ASTELIN) 0.1 % nasal spray, U 1 SPR IEN BID, Disp: , Rfl:    budesonide (ENTOCORT EC) 3 MG 24 hr capsule, Take 6 mg by mouth 2 (two) times daily as needed., Disp: , Rfl:    busPIRone (BUSPAR) 15 MG tablet, TK 1 AND 1/2 T PO AM AND TK 2 TS QHS FOR ANXIETY PRN. MAY TAKE NOON DOSE IF NEEDED FOR SEVERE ANXIETY, Disp: , Rfl:    Calcium-Magnesium-Vitamin D (CALCIUM 500 PO), Take 1 tablet by mouth daily., Disp: , Rfl:    Denosumab (PROLIA [place]), Inject into the skin every 6 (six) months., Disp: , Rfl:    exemestane (AROMASIN) 25 MG tablet, Take 1 tablet (25 mg total) by mouth daily after breakfast., Disp: 90 tablet, Rfl: 3   lamoTRIgine (LAMICTAL) 200 MG tablet, Take 200 mg by mouth at bedtime. , Disp: , Rfl:    Multiple Vitamin (MULTIVITAMIN WITH MINERALS) TABS tablet, Take 1 tablet by mouth daily., Disp: , Rfl:    polyethylene glycol (MIRALAX / GLYCOLAX) packet, Take 17 g by mouth daily as needed., Disp: , Rfl:    TRAZODONE HCL PO, Take 50 mg by mouth at bedtime as needed. , Disp: , Rfl:    Vitamin D, Cholecalciferol, 1000 units CAPS, Take 1 tablet by mouth daily., Disp: , Rfl:    Zinc 50 MG TABS, Take by mouth., Disp: , Rfl:   No orders of the defined types were placed in this encounter.   There are no Patient Instructions on file for this visit.   --Continue cardiac medications as reconciled in final medication list. --Return in about 1 year (around 06/23/2022), or if symptoms worsen or fail to improve. Or sooner if needed. --Continue follow-up with your primary care physician regarding the management of your other chronic comorbid conditions.  Patient's questions and concerns were addressed to her satisfaction. She voices understanding of the instructions provided during this encounter.   This note was created using a voice recognition software as a result there may be grammatical errors inadvertently enclosed that do not reflect the nature of this encounter. Every attempt is made to  correct such errors.  Rex Kras, [place], [facility]  Pager: [phone] Office: [phone]

## 2021-09-18 ENCOUNTER — Encounter: Payer: BC Managed Care – PPO | Admitting: Physical Therapy

## 2021-09-24 ENCOUNTER — Other Ambulatory Visit: Payer: Self-pay | Admitting: Hematology and Oncology

## 2022-05-11 ENCOUNTER — Other Ambulatory Visit: Payer: Self-pay | Admitting: Obstetrics and Gynecology

## 2022-05-11 DIAGNOSIS — Z1231 Encounter for screening mammogram for malignant neoplasm of breast: Secondary | ICD-10-CM

## 2022-06-07 ENCOUNTER — Encounter: Payer: Self-pay | Admitting: Hematology and Oncology

## 2022-06-16 ENCOUNTER — Encounter: Payer: Self-pay | Admitting: Hematology and Oncology

## 2022-06-23 ENCOUNTER — Encounter: Payer: Self-pay | Admitting: Cardiology

## 2022-06-23 ENCOUNTER — Encounter: Payer: Self-pay | Admitting: Hematology and Oncology

## 2022-06-23 ENCOUNTER — Ambulatory Visit: Payer: Medicare Other | Admitting: Cardiology

## 2022-06-23 VITALS — BP 135/86 | HR 71 | Resp 16 | Ht 64.0 in | Wt 131.6 lb

## 2022-06-23 DIAGNOSIS — I2510 Atherosclerotic heart disease of native coronary artery without angina pectoris: Secondary | ICD-10-CM

## 2022-06-23 DIAGNOSIS — G4733 Obstructive sleep apnea (adult) (pediatric): Secondary | ICD-10-CM

## 2022-06-23 DIAGNOSIS — Z712 Person consulting for explanation of examination or test findings: Secondary | ICD-10-CM

## 2022-06-23 DIAGNOSIS — R931 Abnormal findings on diagnostic imaging of heart and coronary circulation: Secondary | ICD-10-CM

## 2022-06-23 DIAGNOSIS — E7800 Pure hypercholesterolemia, unspecified: Secondary | ICD-10-CM

## 2022-06-23 NOTE — Progress Notes (Signed)
Date:  06/23/2022   ID:  Cornell Barman, DOB 1956/04/14, MRN [id number]  PCP:  [name], MD  Cardiologist:  Rex Kras, DO, [facility] (established care 05/12/21 )  Date: 06/23/22 Last Office Visit: 06/23/2021  Chief Complaint  Patient presents with   Follow-up    Annual follow-up visit, no history of severe coronary artery calcification/hyperlipidemia    HPI  Candace Campbell is a 66 y.o. Caucasian female whose past medical history and cardiovascular risk factors include: History of right-sided breast cancer treated with lumpectomy/radiation, OSA w/ dental appliance, hyperlipidemia, severe coronary artery calcification, postmenopausal female.  Patient was referred to the practice at the request of her PCP for evaluation of severe artery calcification.  Since establishing care patient has undergone echocardiogram which noted preserved LVEF, no significant valvular heart disease, and MPI which was reported to be low risk.  She is tolerated Lipitor well without any side effects or intolerances.  And she recently had repeat lipids checked with her PCP as well.  Will request records.  Clinically she denies anginal discomfort or heart failure symptoms.  No hospitalizations or urgent care visits for cardiovascular symptoms since her last office encounter.   She walks her dog at least 2 miles on a regular basis.  And goes to go to fitness and does Pilates and senior workouts.   No family history of premature coronary disease or sudden cardiac death.  ALLERGIES: Allergies  Allergen Reactions   Latex Swelling   Clindamycin/Lincomycin Hives   Peanut-Containing Drug Products Other (See Comments)    Headache/migraine   Sulfa Antibiotics Other (See Comments)    UNSPECIFIED REACTION Pt does not remember the symptoms "They told me I should not take it anymore".     Iodine Swelling and Rash    Including Betadine     MEDICATION LIST PRIOR TO VISIT: Current Meds  Medication Sig   aspirin  EC 81 MG tablet Take 1 tablet (81 mg total) by mouth daily. Swallow whole.   atorvastatin (LIPITOR) 10 MG tablet Take 10 mg by mouth daily.   azelastine (ASTELIN) 0.1 % nasal spray U 1 SPR IEN BID   buPROPion (WELLBUTRIN XL) 150 MG 24 hr tablet Take 150 mg by mouth daily.   busPIRone (BUSPAR) 15 MG tablet TK 1 AND 1/2 T PO AM AND TK 2 TS QHS FOR ANXIETY PRN. MAY TAKE NOON DOSE IF NEEDED FOR SEVERE ANXIETY   Calcium-Magnesium-Vitamin D (CALCIUM 500 PO) Take 1 tablet by mouth daily.   Denosumab (PROLIA [place]) Inject into the skin every 6 (six) months.   exemestane (AROMASIN) 25 MG tablet TAKE 1 TABLET(25 MG) BY MOUTH DAILY AFTER BREAKFAST   lamoTRIgine (LAMICTAL) 200 MG tablet Take 200 mg by mouth at bedtime.    Multiple Vitamin (MULTIVITAMIN WITH MINERALS) TABS tablet Take 1 tablet by mouth daily.   polyethylene glycol (MIRALAX / GLYCOLAX) packet Take 17 g by mouth daily as needed.   TRAZODONE HCL PO Take 50 mg by mouth at bedtime as needed.    Vitamin D, Cholecalciferol, 1000 units CAPS Take 1 tablet by mouth daily.   Zinc 50 MG TABS Take by mouth.     PAST MEDICAL HISTORY: Past Medical History:  Diagnosis Date   Anxiety    Blood dyscrasia    states daughter has Von Willebrand and she was tested 03-19-15 by Dr Lindi Adie   Breast cancer [facility]) 03/13/2015   right breast    Coronary atherosclerosis due to calcified coronary lesion  Depression    GERD (gastroesophageal reflux disease)    Headache    HX MIGRAINES   History of kidney stones    Hyperlipidemia    OSA (obstructive sleep apnea)    Osteopenia    per notes from [facility]    Personal history of radiation therapy    PONV (postoperative nausea and vomiting)     PAST SURGICAL HISTORY: Past Surgical History:  Procedure Laterality Date   ABDOMINAL HYSTERECTOMY     BREAST BIOPSY Right 2016   BREAST LUMPECTOMY Right 2016   BREAST LUMPECTOMY WITH RADIOACTIVE SEED AND SENTINEL LYMPH NODE BIOPSY Right 03/26/2015    Procedure: BREAST LUMPECTOMY WITH RADIOACTIVE SEED AND SENTINEL LYMPH NODE BIOPSY;  Surgeon: [name], MD;  Location: [facility];  Service: General;  Laterality: Right;   BUNIONECTOMY     BILAT   LUMBAR LAMINECTOMY/DECOMPRESSION MICRODISCECTOMY Right 03/03/2016   Procedure: RIGHT LUMBAR FOUR-FIVE LUMBAR [place] MICRODISCECTOMY;  Surgeon: Jovita Gamma, MD;  Location: [facility];  Service: Neurosurgery;  Laterality: Right;  right    FAMILY HISTORY: The patient family history includes Breast cancer (age of onset: 57) in her maternal grandmother; Dementia in her mother; Heart attack in her father; Heart disease in her father; Hyperlipidemia in her sister.  SOCIAL HISTORY:  The patient  reports that she has never smoked. She has never used smokeless tobacco. She reports current alcohol use of about 1.0 standard drink of alcohol per week. She reports that she does not use drugs.  REVIEW OF SYSTEMS: Review of Systems  Cardiovascular:  Negative for chest pain, claudication, dyspnea on exertion, irregular heartbeat, leg swelling, near-syncope, orthopnea, palpitations, paroxysmal nocturnal dyspnea and syncope.  Respiratory:  Negative for shortness of breath.   Hematologic/Lymphatic: Negative for bleeding problem.  Musculoskeletal:  Negative for muscle cramps and myalgias.  Neurological:  Negative for dizziness and light-headedness.    PHYSICAL EXAM:    06/23/2022   10:39 AM 06/23/2021    9:50 AM 05/12/2021   10:53 AM  Vitals with BMI  Height '5\' 4"'$  '5\' 4"'$  '5\' 4"'$   Weight 131 lbs 10 oz 143 lbs 13 oz 139 lbs  BMI 22.58 96.04 54.09  Systolic 811 914 782  Diastolic 86 77 84  Pulse 71 71 69   Physical Exam  Constitutional: No distress.  Age appropriate, hemodynamically stable.   Neck: No JVD present.  Cardiovascular: Normal rate, regular rhythm, S1 normal, S2 normal, intact distal pulses and normal pulses. Exam reveals no gallop, no S3 and no S4.  No murmur  heard. Pulmonary/Chest: Effort normal and breath sounds normal. No stridor. She has no wheezes. She has no rales.  Abdominal: Soft. Bowel sounds are normal. She exhibits no distension. There is no abdominal tenderness.  Musculoskeletal:        General: No edema.     Cervical back: Neck supple.  Neurological: She is alert and oriented to person, place, and time. She has intact cranial nerves (2-12).  Skin: Skin is warm and moist.   CARDIAC DATABASE: EKG: 06/23/2022: Normal sinus rhythm, 64 bpm, LAE, without underlying ischemia or injury pattern  Echocardiogram: 05/26/2021: Normal LV systolic function with visual EF 55-60%. Left ventricle cavity is normal in size. Normal left ventricular wall thickness. Normal global wall motion. Normal diastolic filling pattern, normal LAP. Mild tricuspid regurgitation. No evidence of pulmonary hypertension. No prior study for comparison.  Stress Testing: Exercise nuclear stress test  05/25/2021: Normal ECG stress. The patient exercised for 7  minutes and 20 seconds of a Bruce protocol, achieving approximately 7.05 METs. Normal BP response. No chest pain or arrhythmias noted. Myocardial perfusion is normal. Overall LV systolic function is normal without regional wall motion abnormalities. Stress LV EF: 45%. Visually appears normal. No previous exam available for comparison. Low risk.  Heart Catheterization: None  CT Cardiac Scoring:  04/21/2021 Left main: 0. LAD 390. LCx: 30. RCA 0 Total CAC 420AU, 95 th percentile for patient's age, sex, and race.   LABORATORY DATA:    Latest Ref Rng & Units 04/15/2020   10:34 AM 06/09/2018    9:53 AM 12/09/2017   10:12 AM  CBC  WBC 3.4 - 10.8 x10E3/uL 4.2  5.1  3.6   Hemoglobin 11.1 - 15.9 g/dL 12.6  12.9  12.9   Hematocrit 34.0 - 46.6 % 38.9  40.4  39.5   Platelets 150 - 450 x10E3/uL 239  194  208        Latest Ref Rng & Units 04/15/2020   10:34 AM 06/09/2018    9:53 AM 12/09/2017   10:12 AM  CMP   Glucose 65 - 99 mg/dL 82  78  83   BUN 8 - 27 mg/dL '16  17  12   '$ Creatinine 0.57 - 1.00 mg/dL 0.75  0.86  0.84   Sodium 134 - 144 mmol/L 140  140  141   Potassium 3.5 - 5.2 mmol/L 4.5  4.6  4.1   Chloride 96 - 106 mmol/L 101  105  104   CO2 20 - 29 mmol/L '28  28  28   '$ Calcium 8.7 - 10.3 mg/dL 9.3  9.1  9.1   Total Protein 6.0 - 8.5 g/dL 6.1  6.5  6.7   Total Bilirubin 0.0 - 1.2 mg/dL 0.3  0.4  0.4   Alkaline Phos 44 - 121 IU/L 48  44  43   AST 0 - 40 IU/L '24  19  16   '$ ALT 0 - 32 IU/L '17  15  11     '$ Lipid Panel  Lab Results  Component Value Date   CHOL 218 (H) 12/09/2017   HDL 65 12/09/2017   LDLCALC 136 (H) 12/09/2017   TRIG 84 12/09/2017   CHOLHDL 3.4 12/09/2017    No components found for: "NTPROBNP" No results for input(s): "PROBNP" in the last 8760 hours. No results for input(s): "TSH" in the last 8760 hours.  BMP No results for input(s): "NA", "K", "CL", "CO2", "GLUCOSE", "BUN", "CREATININE", "CALCIUM", "GFRNONAA", "GFRAA" in the last 8760 hours.  HEMOGLOBIN A1C No results found for: "HGBA1C", "MPG"  External Labs: Collected: 04/03/2021 Total cholesterol 236, triglycerides 54, HDL 65, LDL 162, non-HDL 171 Sodium 141, potassium 4.7, chloride 99, bicarb 27, BUN 20, creatinine 0.88 AST 16, ALT 10, alkaline phosphatase 44  IMPRESSION:    ICD-10-CM   1. Coronary atherosclerosis due to calcified coronary lesion  I25.10 EKG 12-Lead   I25.84     2. Agatston coronary artery calcium score greater than 400  R93.1     3. Pure hypercholesterolemia  E78.00     4. OSA (obstructive sleep apnea)  G47.33     5. Encounter to discuss test results  Z71.2        RECOMMENDATIONS: Candace Campbell is a 66 y.o. Caucasian female whose past medical history and cardiac risk factors include: History of right-sided breast cancer treated with lumpectomy/radiation, OSA w/ dental appliance, hyperlipidemia, severe coronary artery calcification, postmenopausal female.  Coronary  atherosclerosis  due to calcified coronary lesion Total CAC 420 AU, 95th percentile Denies angina pectoris. EKG: Nonischemic Echocardiogram: Preserved LVEF, normal diastolic function, no significant valvular heart disease. Stress test: Low risk Continue aspirin and statin therapy  Functional status is improved since last office visit. No additional cardiovascular testing warranted at this time. Will obtain labs from PCP to update records with regards to recent lipids.  Pure hypercholesterolemia Currently on Lipitor.   She denies myalgia or other side effects. Recently had them checked with PCP-will obtain records.  OSA (obstructive sleep apnea) Currently compliant with her dental appliance.  FINAL MEDICATION LIST END OF ENCOUNTER: No orders of the defined types were placed in this encounter.    Medications Discontinued During This Encounter  Medication Reason   budesonide (ENTOCORT EC) 3 MG 24 hr capsule Patient Preference     Current Outpatient Medications:    aspirin EC 81 MG tablet, Take 1 tablet (81 mg total) by mouth daily. Swallow whole., Disp: 30 tablet, Rfl: 11   atorvastatin (LIPITOR) 10 MG tablet, Take 10 mg by mouth daily., Disp: , Rfl:    azelastine (ASTELIN) 0.1 % nasal spray, U 1 SPR IEN BID, Disp: , Rfl:    buPROPion (WELLBUTRIN XL) 150 MG 24 hr tablet, Take 150 mg by mouth daily., Disp: , Rfl:    busPIRone (BUSPAR) 15 MG tablet, TK 1 AND 1/2 T PO AM AND TK 2 TS QHS FOR ANXIETY PRN. MAY TAKE NOON DOSE IF NEEDED FOR SEVERE ANXIETY, Disp: , Rfl:    Calcium-Magnesium-Vitamin D (CALCIUM 500 PO), Take 1 tablet by mouth daily., Disp: , Rfl:    Denosumab (PROLIA [place]), Inject into the skin every 6 (six) months., Disp: , Rfl:    exemestane (AROMASIN) 25 MG tablet, TAKE 1 TABLET(25 MG) BY MOUTH DAILY AFTER BREAKFAST, Disp: 90 tablet, Rfl: 3   lamoTRIgine (LAMICTAL) 200 MG tablet, Take 200 mg by mouth at bedtime. , Disp: , Rfl:    Multiple Vitamin (MULTIVITAMIN WITH MINERALS)  TABS tablet, Take 1 tablet by mouth daily., Disp: , Rfl:    polyethylene glycol (MIRALAX / GLYCOLAX) packet, Take 17 g by mouth daily as needed., Disp: , Rfl:    TRAZODONE HCL PO, Take 50 mg by mouth at bedtime as needed. , Disp: , Rfl:    Vitamin D, Cholecalciferol, 1000 units CAPS, Take 1 tablet by mouth daily., Disp: , Rfl:    Zinc 50 MG TABS, Take by mouth., Disp: , Rfl:   Orders Placed This Encounter  Procedures   EKG 12-Lead     There are no Patient Instructions on file for this visit.   --Continue cardiac medications as reconciled in final medication list. --Return in about 1 year (around 06/24/2023) for Annual follow up visit, Coronary artery calcification. Or sooner if needed. --Continue follow-up with your primary care physician regarding the management of your other chronic comorbid conditions.  Patient's questions and concerns were addressed to her satisfaction. She voices understanding of the instructions provided during this encounter.   This note was created using a voice recognition software as a result there may be grammatical errors inadvertently enclosed that do not reflect the nature of this encounter. Every attempt is made to correct such errors.  Rex Kras, [place], [facility]  Pager: [phone] Office: [phone]

## 2022-07-05 ENCOUNTER — Encounter: Payer: Self-pay | Admitting: Hematology and Oncology

## 2022-07-11 NOTE — Progress Notes (Signed)
External Labs: Collected: 04/06/2022 Hemoglobin 12.8 g/dL, hematocrit 39.3%. BUN 14, creatinine 0.89. Sodium 140, potassium 4.3, chloride 101, bicarb 34. AST 15, alkaline phosphatase 46, ALT 14. Total cholesterol 160, triglycerides 54, HDL 70, LDL 79, non-HDL 90

## 2022-07-12 ENCOUNTER — Encounter: Payer: Self-pay | Admitting: Hematology and Oncology

## 2022-07-12 ENCOUNTER — Ambulatory Visit
Admission: RE | Admit: 2022-07-12 | Discharge: 2022-07-12 | Disposition: A | Discharge status: Home / Self Care | Payer: Medicare Other | Source: Ambulatory Visit | Attending: Obstetrics and Gynecology | Admitting: Obstetrics and Gynecology

## 2022-07-12 DIAGNOSIS — Z1231 Encounter for screening mammogram for malignant neoplasm of breast: Secondary | ICD-10-CM

## 2022-08-02 ENCOUNTER — Encounter: Payer: Self-pay | Admitting: Neurology

## 2022-08-02 ENCOUNTER — Telehealth: Payer: Self-pay | Admitting: Neurology

## 2022-08-02 ENCOUNTER — Ambulatory Visit (INDEPENDENT_AMBULATORY_CARE_PROVIDER_SITE_OTHER): Payer: Self-pay | Admitting: Neurology

## 2022-08-02 DIAGNOSIS — Z91199 Patient's noncompliance with other medical treatment and regimen due to unspecified reason: Secondary | ICD-10-CM

## 2022-08-02 NOTE — Progress Notes (Signed)
Candace Campbell no showed patient appointment today.    If patient calls back please have a talk, we have a lot of patients awaiting appointments and its really not fair to them when people do not show up, please ask not to make an appointment unless intends on coming.  However Something legitimate could have happened today and if so we totally understand and we are happy to see back, she has never no showed to our practice,  but if she no-shows again she may be dismissed from our practice per office policy. Below Is precharting in case of another appointment     Provider:  Dr Jaynee Campbell Requesting Provider: [name], * Primary Care Provider:  [name], MD  CC:  Memory concerns  Follow-up August 02, 2022: We last saw patient 3 years ago for word loss and short-term memory.  Past medical history depression and anxiety, breast cancer status postlumpectomy and radiation, sleep apnea with dental device, severe coronary artery calcification seen cardiology, follows with Candace Campbell, had right L4-L5 lumbar disc herniation with surgery with Candace Campbell in 2017.  I reviewed my notes and notes in epic: At that time she had situational anxiety after her mom passed away and was complaining of short-term memory loss.  At that time she declined an MRI of the brain.  She had already had neuropsychological testing which stated anxiety at the cause.  We reassured her at that time and told her she could follow-up as needed.  My review of her epic chart shows no brain imaging since then or in the past.  She has seen cardiology and hematology since then.  Diagnosed 2 years ago with breast cancer of lower inner quadrant of right female breast by Candace Campbell.  Stage Ia.  Last note by cardiology last month by Candace Campbell showed that she had was treated with lumpectomy/radiation, obstructive sleep apnea with dental appliance, hyperlipidemia, and being seen for severe coronary artery calcification and had undergone  echocardiogram and MPI was reported to be low.  Risk.  On Lipitor.  No symptoms.  Follow-up yearly.  Last saw Candace Campbell in 2022, I reviewed that note, she had right breast cancer treated with lumpectomy radiation she discontinued antiestrogen therapy after 5 years, continue exemestane for 2 years then discontinue antiestrogen therapy when she completes a total of 7 years treatment.  She also has severe osteopenia.  She was also asked to return in 1 year to see Candace Campbell.  HPI 04/15/2020:  Candace Campbell is a 67 y.o. female here as requested by Candace Campbell, * for word loss and short-term memory loss. PMHx depression and anxiety. I reviewed Candace Campbell notes, last seen for chief complaint "my mother passed away and my anxiety has been terrible".  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  She started noticing symptoms "a long time ago", she puts letters in the wrong position, someone will tell her something and  she has no recollection of it, seems so much worse this year, her mother had dementia unknown type started around 62 and she dies at the age of 75. 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 She will make mistakes on orders. She sent her invoice in and she will have added it up wrong, getting frustrating. She already had formal neurocognitive testing and diagnosed with anxiety not ADHD. She has been forgetful since a cild, she would forget tings in her locker, short-term memory loss is not new but the last 6-7 years it has worse with short-term memory loss, concentrating, she has problems getting words out and feels really slow she has to really think about it. She forgets the words of things at home. She has sleep apnea and uses a sleep appliance.   Reviewed notes, labs and imaging from outside physicians, which showed: see above  Review of Systems: Patient complains of symptoms per HPI as well as the following symptoms: anxiety. Pertinent negatives and positives per HPI. All others negative.

## 2022-08-02 NOTE — Telephone Encounter (Signed)
Candace Campbell no showed patient appointment today.    If patient calls back please have a talk, we have a lot of patients awaiting appointments and its really not fair to them when people do not show up, please ask not to make an appointment unless intends on coming.  However Something legitimate could have happened today and if so we totally understand and we are happy to see back, she has never no showed to our practice,  but if she no-shows again she may be dismissed from our practice per office policy.

## 2022-08-04 ENCOUNTER — Encounter: Payer: Self-pay | Admitting: Hematology and Oncology

## 2022-08-04 ENCOUNTER — Other Ambulatory Visit: Payer: Self-pay

## 2022-08-04 ENCOUNTER — Telehealth: Payer: Self-pay

## 2022-08-04 MED ORDER — EXEMESTANE 25 MG PO TABS: ORAL_TABLET | ORAL | 0 refills | Status: DC

## 2022-08-04 NOTE — Telephone Encounter (Signed)
Pt returned call and verbalized thanks and understanding. She knows someone from scheduling will contact her. Medication sent to [facility] per her request.

## 2022-08-04 NOTE — Telephone Encounter (Signed)
Pt called and LVM returning my call via previous note. Attempted to call pt back and LVM for her to call us back to make her aware we sent in 30 day supply and sent message to scheduler to get her in for f/u.

## 2022-08-04 NOTE — Telephone Encounter (Signed)
Pt called and LVM asking if she needs refill on examestane as she has completed 5 years, Per MD note in 2022. She will complete 5 years of antiestrogen therapy in Feb 2024. Sent in refill for 30 days per MD. Attempted to call pt to schedule f/u as she has not had an appt since 09/2020. LVM for call back.

## 2022-10-04 ENCOUNTER — Ambulatory Visit (INDEPENDENT_AMBULATORY_CARE_PROVIDER_SITE_OTHER): Payer: Medicare Other | Admitting: Neurology

## 2022-10-04 ENCOUNTER — Encounter: Payer: Self-pay | Admitting: Neurology

## 2022-10-04 ENCOUNTER — Telehealth: Payer: Self-pay | Admitting: Neurology

## 2022-10-04 VITALS — BP 141/88 | HR 64 | Ht 65.0 in | Wt 127.0 lb

## 2022-10-04 DIAGNOSIS — R4189 Other symptoms and signs involving cognitive functions and awareness: Secondary | ICD-10-CM | POA: Diagnosis not present

## 2022-10-04 DIAGNOSIS — R410 Disorientation, unspecified: Secondary | ICD-10-CM | POA: Diagnosis not present

## 2022-10-04 DIAGNOSIS — F09 Unspecified mental disorder due to known physiological condition: Secondary | ICD-10-CM

## 2022-10-04 DIAGNOSIS — Z820 Family history of epilepsy and other diseases of the nervous system: Secondary | ICD-10-CM

## 2022-10-04 DIAGNOSIS — R413 Other amnesia: Secondary | ICD-10-CM | POA: Diagnosis not present

## 2022-10-04 DIAGNOSIS — I7091 Generalized atherosclerosis: Secondary | ICD-10-CM

## 2022-10-04 DIAGNOSIS — Z7189 Other specified counseling: Secondary | ICD-10-CM

## 2022-10-04 DIAGNOSIS — I679 Cerebrovascular disease, unspecified: Secondary | ICD-10-CM

## 2022-10-04 NOTE — Progress Notes (Signed)
Candace Campbell[id number]    Provider:  Dr Jaynee Eagles Requesting Provider: [name], * Primary Care Provider:  [name], MD  CC:  Memory concerns  10/04/2022: Worse about losing things and immediately forgetting what she was doing, she missed her last appointment. She will run into someone she knows and forgets her name. Hard time spelling, can't remember her words. She has gone to he work missing things. Hard time with math. Progressive memory loss. Having problems adding. She was testing for ADD and does not have it.  She was tested just for ADD NOT formal memory testing at least 4-5 years ago. Saw her in 2021 also concerned at that time for memory loss. Yesterday she was going to the gym, she goes every morning, she is stronger, feeling very positive about that, anxiety and stress better she has situational anxiety her son and grandson lives with them and there are social problems in her life for years. But her anxiety has improved over the years, her son has gotten much better and doing better and better dad and things are better at home. Worsened the last year. Mother had dementia and started in her 34s and 39s and in 63s was in a memory care unit. She has episodes she cannot remember at all. Forgets where she is going. She has gotten lost. No other focal neurologic deficits, associated symptoms, inciting events or modifiable factors.  Patient complains of symptoms per HPI as well as the following symptoms: none . Pertinent negatives and positives per HPI. All others negative   Reviewed notes, labs and imaging from outside physicians, which showed:    HPI 04/15/2020 :  Candace Campbell is a 67 y.o. female here as requested by [name], * for word loss and short-term memory loss. PMHx depression and anxiety. I reviewed Dr. Marquis Buggy notes, last seen for chief complaint "my mother passed away and my anxiety has been terrible".  Patient last reported that her  mother passed away and her anxiety had been terrible, medications are working well, occasional depressive symptoms of low energy, lack of motivation, irritability, crying spells due to thinking about the loss of her mother, having difficulty with finding words and short-term memory which she is requesting referral to neurology and therapy, denies suicidal or homicidal ideations, hallucinations or delusions, she was on Strattera for ADHD but asked to be weaned off of that, diagnosed by Dr. 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  She started noticing symptoms "a long time ago", she puts letters in the wrong position, someone will tell her something and she has no recollection of it, seems so much worse this year, her mother had dementia unknown type started around 59 and she dies at the age of 66. 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 She will make mistakes on orders. She sent her invoice in and she will have added it up wrong, getting frustrating. She already had formal neurocognitive testing and diagnosed with anxiety not ADHD. She has been forgetful since a cild, she would forget tings in her locker, short-term memory loss is not new but the last 6-7 years it has worse with short-term memory loss, concentrating, she has  problems getting words out and feels really slow she has to really think about it. She forgets the words of things at home. She has sleep apnea and uses a sleep appliance.   Reviewed notes, labs and imaging from outside physicians, which showed: see above  Review of Systems: Patient complains of symptoms per HPI as well as the following symptoms: anxiety.  Pertinent negatives and positives per HPI. All others negative.   Social History   Socioeconomic History   Marital status: Married    Spouse name: Clair Gulling   Number of children: 2   Years of education: Not on file   Highest education level: Bachelor's degree (e.g., BA, AB, BS)  Occupational History   Not on file  Tobacco Use   Smoking status: Never   Smokeless tobacco: Never  Vaping Use   Vaping Use: Never used  Substance and Sexual Activity   Alcohol use: Yes    Alcohol/week: 1.0 standard drink of alcohol    Types: 1 Standard drinks or equivalent per week    Comment: social   Drug use: No   Sexual activity: Yes    Birth control/protection: Surgical  Other Topics Concern   Not on file  Social History Narrative   Lives at home with spouse. Son and grandson live with them.   Right handed   Caffeine: occasional green tea 1 cup   Social Determinants of Health   Financial Resource Strain: Not on file  Food Insecurity: Not on file  Transportation Needs: Not on file  Physical Activity: Not on file  Stress: Not on file  Social Connections: Not on file  Intimate Partner Violence: Not on file    Family History  Problem Relation Age of Onset   Dementia Mother    Heart disease Father    Heart attack Father    Hyperlipidemia Sister    Breast cancer Maternal Grandmother 72    Past Medical History:  Diagnosis Date   Anxiety    Blood dyscrasia    states daughter has Von Willebrand and she was tested 03-19-15 by Dr Lindi Adie   Breast cancer 03/13/2015   right breast    Coronary atherosclerosis due to calcified coronary lesion    Depression    GERD (gastroesophageal reflux disease)    Headache    HX MIGRAINES   History of kidney stones    Hyperlipidemia    OSA (obstructive sleep apnea)    Osteopenia    per notes from [facility] history of radiation therapy    PONV (postoperative nausea and vomiting)     Patient Active Problem List    Diagnosis Date Noted   HNP (herniated nucleus pulposus), lumbar 03/03/2016   At high risk for bleeding 03/19/2015   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 female breast 03/17/2015    Past Surgical History:  Procedure Laterality Date   ABDOMINAL HYSTERECTOMY     BREAST BIOPSY Right 2016   BREAST LUMPECTOMY Right 2016   BREAST LUMPECTOMY WITH RADIOACTIVE SEED AND SENTINEL LYMPH NODE BIOPSY Right 03/26/2015   Procedure: BREAST LUMPECTOMY WITH RADIOACTIVE SEED AND SENTINEL LYMPH NODE BIOPSY;  Surgeon: [name], MD;  Location: [facility];  Service: General;  Laterality: Right;   BUNIONECTOMY     BILAT   LUMBAR LAMINECTOMY/DECOMPRESSION MICRODISCECTOMY Right 03/03/2016   Procedure: RIGHT LUMBAR FOUR-FIVE LUMBAR [place] MICRODISCECTOMY;  Surgeon: Jovita Gamma, MD;  Location: [facility];  Service: Neurosurgery;  Laterality: Right;  right    Current Outpatient Medications  Medication Sig Dispense Refill   aspirin EC 81 MG tablet Take 1 tablet (81 mg total) by mouth daily. Swallow whole. 30 tablet 11   atorvastatin (LIPITOR) 10 MG tablet Take 10 mg by mouth daily.     azelastine (ASTELIN) 0.1 % nasal spray U 1 SPR IEN BID     busPIRone (BUSPAR) 15 MG tablet TK 1 AND 1/2 T PO AM AND TK 2 TS QHS FOR ANXIETY PRN. MAY TAKE NOON DOSE IF NEEDED FOR SEVERE ANXIETY     Calcium-Magnesium-Vitamin D (CALCIUM 500 PO) Take 1 tablet by mouth daily.     Denosumab (PROLIA [place]) Inject into the skin every 6 (six) months.     DULoxetine (CYMBALTA) 60 MG capsule Take 60 mg by mouth daily.     lamoTRIgine (LAMICTAL) 200 MG tablet Take 200 mg by mouth at bedtime.      Multiple Vitamin (MULTIVITAMIN WITH MINERALS) TABS tablet Take 1 tablet by mouth daily.     Omega-3 Fatty Acids (FISH OIL PO) Take by mouth.     polyethylene glycol (MIRALAX / GLYCOLAX) packet Take 17 g by mouth daily as needed.     TRAZODONE HCL PO Take 50 mg by mouth at bedtime as needed. Takes every night      Vitamin D, Cholecalciferol, 1000 units CAPS Take 1 tablet by mouth daily.     Zinc 50 MG TABS Take by mouth.     No current facility-administered medications for this visit.    Allergies as of 10/04/2022 - Review Complete 10/04/2022  Allergen Reaction Noted   Latex Swelling 03/19/2015   Clindamycin/lincomycin Hives 06/24/2015   Peanut-containing drug products Other (See Comments) 03/29/2015   Sulfa antibiotics Other (See Comments) 03/19/2015   Iodine Swelling and Rash 03/29/2015    Vitals: BP (!) 141/88 (BP Location: Left Arm, Patient Position: Sitting)   Pulse 64   Ht 5\' 5"  (1.651 m)   Wt 127 lb (57.6 kg)   BMI 21.13 kg/m  Last Weight:  Wt Readings from Last 1 Encounters:  10/04/22 127 lb (57.6 kg)   Last Height:   Ht Readings from Last 1 Encounters:  10/04/22 5\' 5"  (1.651 m)    Exam: NAD, pleasant                  Speech:    Speech is normal; fluent and spontaneous with normal comprehension.  Cognition:    10/04/2022    8:39 AM 04/15/2020    9:43 AM  MMSE - Mini Mental State Exam  Orientation to time 5 5  Orientation to Place 5 4  Registration 3 3  Attention/ Calculation 5 4  Recall 2 2  Language- name 2 objects 2 2  Language- repeat 1 1  Language- follow 3 step command 3 3  Language- read & follow direction 1 1  Write a sentence 1 1  Copy design 1 1  Total score 29 27      10/04/2022   10:40 AM  Montreal Cognitive Assessment   Visuospatial/ Executive (0/5) 4  Naming (0/3) 3  Attention: Read list of digits (0/2) 2  Attention: Read list of letters (0/1) 1  Attention: Serial 7 subtraction starting at 100 (0/3) 3  Language: Repeat phrase (0/2) 1  Language : Fluency (0/1) 1  Abstraction (0/2) 2  Delayed Recall (0/5) 1  Orientation (0/6) 6  Total 24  Adjusted Score (based on education) 24  The patient is oriented to person, place, and time;     recent and remote memory intact;     language fluent;    Cranial Nerves:    The pupils are equal,  round, and reactive to light.Trigeminal sensation is intact and the muscles of mastication are normal. The face is symmetric. The palate elevates in the midline. Hearing intact. Voice is normal. Shoulder shrug is normal. The tongue has normal motion without fasciculations.   Coordination:  No dysmetria  Motor Observation:    No asymmetry, no atrophy, and no involuntary movements noted. Tone:    Normal muscle tone.     Strength:    Strength is V/V in the upper and lower limbs.      Sensation: intact to LT     Assessment/Plan:  66 with Fhx dementia, progressive cognitive decline, even though MMSE is 29 her MoCA is 24. Need a workup, discussed in detail, discussed new drug, teasting, dementia in general.    Address Sleep Apnea or at least ensure it is treated. See physician.  Formal memory testing - Dr. Alphonzo Severance IF ATN Profile comes back abnormal(abnormal alzheimers proteins in the blood) I order PET scan to look for amyloid in the brain Alzheimer's gene EEG MRI brain  Blood work   Orders Placed This Encounter  Procedures   MR BRAIN W WO CONTRAST   ATN PROFILE   APOE Alzheimer's Risk   B12 and Folate Panel   Methylmalonic acid, serum   Vitamin B1   TSH Rfx on Abnormal to Free T4   Homocysteine   CBC with Differential/Platelets   Comprehensive metabolic panel   Ambulatory referral to Neuropsychology   EEG adult     Cc: Warrick Parisian, MD  Sarina Ill, MD  [facility] [address] [place], [place] [address]  Phone [phone] Fax [phone]  I spent over 45 minutes of face-to-face and non-face-to-face time with patient on the  1. Short-term memory loss   2. Progressive cognitive dysfunction   3. Cognitive decline   4. Memory loss   5. Episode of memory loss   6. Episode of confusion   7. Cerebrovascular disease consultation   8. FHx: Alzheimer's disease   9. Generalized  atherosclerosis    diagnosis.  This included previsit chart review, lab review, study review, order entry, electronic health record documentation, patient education on the different diagnostic and therapeutic options, counseling and coordination of care, risks and benefits of management, compliance, or risk factor reduction

## 2022-10-04 NOTE — Telephone Encounter (Signed)
medicare NPR sent to [facility] [phone] 

## 2022-10-04 NOTE — Patient Instructions (Addendum)
Address Sleep Apnea or at least ensure it is treated.  Formal memory testing - Dr. Alphonzo Severance IF ATN Profile comes back abnormal(abnormal alzheimers proteins in the blood) I order PET scan to look for amyloid in the brain Alzheimer's gene EEG MRI brain  Blood work  Orders Placed This Encounter  Procedures   MR BRAIN W WO CONTRAST   ATN PROFILE   APOE Alzheimer's Risk   B12 and Folate Panel   Methylmalonic acid, serum   Vitamin B1   TSH Rfx on Abnormal to Free T4   Homocysteine   CBC with Differential/Platelets   Comprehensive metabolic panel   Ambulatory referral to Neuropsychology   EEG adult   Dementia Dementia is a condition that affects the way the brain functions. It often affects memory and thinking. Usually, dementia gets worse with time and cannot be reversed (progressive dementia). There are many types of dementia, including: Alzheimer's disease. This type is the most common. Vascular dementia. This type may happen as the result of a stroke. Lewy body dementia. This type may happen to people who have Parkinson's disease. Frontotemporal dementia. This type is caused by damage to nerve cells (neurons) in certain parts of the brain. Some people may be affected by more than one type of dementia. This is called mixed dementia. What are the causes? Dementia is caused by damage to cells in the brain. The area of the brain and the types of cells damaged determine the type of dementia. Usually, this damage is irreversible or cannot be undone. Some examples of irreversible causes include: Conditions that affect the blood vessels of the brain, such as diabetes, heart disease, or blood vessel disease. Genetic mutations. In some cases, changes in the brain may be caused by another condition and can be reversed or slowed. Some examples of reversible causes include: Injury to the brain. Certain medicines. Infection, such as meningitis. Metabolic problems, such as vitamin B12  deficiency or thyroid disease. Pressure on the brain, such as from a tumor, blood clot, or too much fluid in the brain (hydrocephalus). Autoimmune diseases that affect the brain or arteries, such as limbic encephalitis or vasculitis. What are the signs or symptoms? Symptoms of dementia depend on the type of dementia. Common signs of dementia include problems with remembering, thinking, problem solving, decision making, and communicating. These signs develop slowly or get worse with time. This may include: Problems remembering events or people. Having trouble taking a bath or putting clothes on. Forgetting appointments or forgetting to pay bills. Difficulty planning and preparing meals. Having trouble speaking. Getting lost easily. Changes in behavior or mood. How is this diagnosed? This condition is diagnosed by a specialist (neurologist). It is diagnosed based on the history of your symptoms, your medical history, a physical exam, and tests. Tests may include: Tests to evaluate brain function, such as memory tests, cognitive tests, and other tests. Lab tests, such as blood or urine tests. Imaging tests, such as a CT scan, a PET scan, or an MRI. Genetic testing. This may be done if other family members have a diagnosis of certain types of dementia. Your health care provider will talk with you and your family, friends, or caregivers about your history and symptoms. How is this treated? Treatment for this condition depends on the cause of the dementia. Progressive dementias, such as Alzheimer's disease, cannot be cured, but there may be treatments that help to manage symptoms. Treatment might involve taking medicines that may help to: Control the dementia. Slow  down the progression of the dementia. Manage symptoms. In some cases, treating the cause of your dementia can improve symptoms, reverse symptoms, or slow down how quickly your dementia becomes worse. Your health care provider can  direct you to support groups, organizations, and other health care providers who can help with decisions about your care. Follow these instructions at home: Medicines Take over-the-counter and prescription medicines only as told by your health care provider. Use a pill organizer or pill reminder to help you manage your medicines. Avoid taking medicines that can affect thinking, such as pain medicines or sleeping medicines. Lifestyle Make healthy lifestyle choices. Be physically active as told by your health care provider. Do not use any products that contain nicotine or tobacco, such as cigarettes, e-cigarettes, and chewing tobacco. If you need help quitting, ask your health care provider. Do not drink alcohol. Practice stress-management techniques when you get stressed. Spend time with other people. Make sure to get quality sleep. These tips can help you get a good night's rest: Avoid napping during the day. Keep your sleeping area dark and cool. Avoid exercising during the few hours before you go to bed. Avoid caffeine products in the evening. Eating and drinking Drink enough fluid to keep your urine pale yellow. Eat a healthy diet. General instructions  Work with your health care provider to determine what you need help with and what your safety needs are. Talk with your health care provider about whether it is safe for you to drive. If you were given a bracelet that identifies you as a person with memory loss or tracks your location, make sure to wear it at all times. Work with your family to make important decisions, such as advance directives, medical power of attorney, or a living will. Keep all follow-up visits. This is important. Where to find more information Alzheimer's Association: [employer] National Institute on Aging: DVDEnthusiasts.nl World Health Organization: RoleLink.com.br Contact a health care provider if: You have any new or worsening symptoms. You have  problems with choking or swallowing. Get help right away if: You feel depressed or sad, or feel that you want to harm yourself. Your family members become concerned for your safety. If you ever feel like you may hurt yourself or others, or have thoughts about taking your own life, get help right away. Go to your nearest emergency department or: Call your local emergency services (911 in the [place] Call a suicide crisis helpline, such as the [place] at (314)292-2024 or 988 in the [place]. This is open 24 hours a day in the [place] Text the Crisis Text Line at 872-008-9378 (in the [place] Summary Dementia is a condition that affects the way the brain functions. Dementia often affects memory and thinking. Usually, dementia gets worse with time and cannot be reversed (progressive dementia). Treatment for this condition depends on the cause of the dementia. Work with your health care provider to determine what you need help with and what your safety needs are. Your health care provider can direct you to support groups, organizations, and other health care providers who can help with decisions about your care. This information is not intended to replace advice given to you by your health care provider. Make sure you discuss any questions you have with your health care provider. Document Revised: 01/14/2021 Document Reviewed: 11/05/2019 Elsevier Patient Education  [place] Injection What is this medication? LECANEMAB (lek AN e mab) treats Alzheimer disease. It works by decreasing  the buildup of amyloid, a protein that may cause Alzheimer disease. This may slow down the worsening of symptoms. It is a monoclonal antibody. This medicine may be used for other purposes; ask your health care provider or pharmacist if you have questions. COMMON BRAND NAME(S): LEQEMBI What should I tell my care team before I take this medication? They need to know if you have any of  these conditions: An unusual or allergic reaction to lecanemab, other medications, foods, dyes, or preservatives Take medications that treat or prevent blood clots Pregnant or trying to get pregnant Breast-feeding How should I use this medication? This medication is injected into a vein. It is given by your care team in a hospital or clinic setting. A special MedGuide will be given to you before each treatment. Be sure to read this information carefully each time. Talk to your care team about the use of this medication in children. Special care may be needed. Overdosage: If you think you have taken too much of this medicine contact a poison control center or emergency room at once. NOTE: This medicine is only for you. Do not share this medicine with others. What if I miss a dose? Keep appointments for follow-up doses. It is important not to miss your dose. Call your care team if you are unable to keep an appointment. What may interact with this medication? Interactions have not been studied. This list may not describe all possible interactions. Give your health care provider a list of all the medicines, herbs, non-prescription drugs, or dietary supplements you use. Also tell them if you smoke, drink alcohol, or use illegal drugs. Some items may interact with your medicine. What should I watch for while using this medication? Visit your care team for regular checks on your progress. Tell your care team if your symptoms do not start to get better or if they get worse. What side effects may I notice from receiving this medication? Side effects that you should report to your care team as soon as possible: Allergic reactions or angioedema--skin rash, itching or hives, swelling of the face, eyes, lips, tongue, arms, or legs, trouble swallowing or breathing Headache, worsening confusion, dizziness, change in vision, nausea, seizures Infusion reactions--chest pain, shortness of breath or trouble  breathing, feeling faint or lightheaded Side effects that usually do not require medical attention (report these to your care team if they continue or are bothersome): Cough Diarrhea Headache This list may not describe all possible side effects. Call your doctor for medical advice about side effects. You may report side effects to FDA at 1-800-FDA-1088. Where should I keep my medication? This medication is given in a hospital or clinic. It will not be stored at home. NOTE: This sheet is a summary. It may not cover all possible information. If you have questions about this medicine, talk to your doctor, pharmacist, or health care provider.  2023 Elsevier/Gold Standard (2021-07-21 00:00:00)    Sleep Apnea Sleep apnea is a condition in which breathing pauses or becomes shallow during sleep. People with sleep apnea usually snore loudly. They may have times when they gasp and stop breathing for 10 seconds or more during sleep. This may happen many times during the night. Sleep apnea disrupts your sleep and keeps your body from getting the rest that it needs. This condition can increase your risk of certain health problems, including: Heart attack. Stroke. Obesity. Type 2 diabetes. Heart failure. Irregular heartbeat. High blood pressure. The goal of treatment is to help  you breathe normally again. What are the causes?  The most common cause of sleep apnea is a collapsed or blocked airway. There are three kinds of sleep apnea: Obstructive sleep apnea. This kind is caused by a blocked or collapsed airway. Central sleep apnea. This kind happens when the part of the brain that controls breathing does not send the correct signals to the muscles that control breathing. Mixed sleep apnea. This is a combination of obstructive and central sleep apnea. What increases the risk? You are more likely to develop this condition if you: Are overweight. Smoke. Have a smaller than normal airway. Are  older. Are female. Drink alcohol. Take sedatives or tranquilizers. Have a family history of sleep apnea. Have a tongue or tonsils that are larger than normal. What are the signs or symptoms? Symptoms of this condition include: Trouble staying asleep. Loud snoring. Morning headaches. Waking up gasping. Dry mouth or sore throat in the morning. Daytime sleepiness and tiredness. If you have daytime fatigue because of sleep apnea, you may be more likely to have: Trouble concentrating. Forgetfulness. Irritability or mood swings. Personality changes. Feelings of depression. Sexual dysfunction. This may include loss of interest if you are female, or erectile dysfunction if you are female. How is this diagnosed? This condition may be diagnosed with: A medical history. A physical exam. A series of tests that are done while you are sleeping (sleep study). These tests are usually done in a sleep lab, but they may also be done at home. How is this treated? Treatment for this condition aims to restore normal breathing and to ease symptoms during sleep. It may involve managing health issues that can affect breathing, such as high blood pressure or obesity. Treatment may include: Sleeping on your side. Using a decongestant if you have nasal congestion. Avoiding the use of depressants, including alcohol, sedatives, and narcotics. Losing weight if you are overweight. Making changes to your diet. Quitting smoking. Using a device to open your airway while you sleep, such as: An oral appliance. This is a custom-made mouthpiece that shifts your lower jaw forward. A continuous positive airway pressure (CPAP) device. This device blows air through a mask when you breathe out (exhale). A nasal expiratory positive airway pressure (EPAP) device. This device has valves that you put into each nostril. A bi-level positive airway pressure (BIPAP) device. This device blows air through a mask when you breathe in  (inhale) and breathe out (exhale). Having surgery if other treatments do not work. During surgery, excess tissue is removed to create a wider airway. Follow these instructions at home: Lifestyle Make any lifestyle changes that your health care provider recommends. Eat a healthy, well-balanced diet. Take steps to lose weight if you are overweight. Avoid using depressants, including alcohol, sedatives, and narcotics. Do not use any products that contain nicotine or tobacco. These products include cigarettes, chewing tobacco, and vaping devices, such as e-cigarettes. If you need help quitting, ask your health care provider. General instructions Take over-the-counter and prescription medicines only as told by your health care provider. If you were given a device to open your airway while you sleep, use it only as told by your health care provider. If you are having surgery, make sure to tell your health care provider you have sleep apnea. You may need to bring your device with you. Keep all follow-up visits. This is important. Contact a health care provider if: The device that you received to open your airway during sleep is uncomfortable or  does not seem to be working. Your symptoms do not improve. Your symptoms get worse. Get help right away if: You develop: Chest pain. Shortness of breath. Discomfort in your back, arms, or stomach. You have: Trouble speaking. Weakness on one side of your body. Drooping in your face. These symptoms may represent a serious problem that is an emergency. Do not wait to see if the symptoms will go away. Get medical help right away. Call your local emergency services (911 in the [place] Do not drive yourself to the hospital. Summary Sleep apnea is a condition in which breathing pauses or becomes shallow during sleep. The most common cause is a collapsed or blocked airway. The goal of treatment is to restore normal breathing and to ease symptoms during  sleep. This information is not intended to replace advice given to you by your health care provider. Make sure you discuss any questions you have with your health care provider. Document Revised: 01/28/2021 Document Reviewed: 05/30/2020 Elsevier Patient Education  [place].

## 2022-10-04 NOTE — Telephone Encounter (Signed)
Referral sent to Dr. Peter Stewart, phone # [phone]. 

## 2022-10-05 LAB — TSH RFX ON ABNORMAL TO FREE T4: TSH: 2.09 u[IU]/mL (ref 0.450–4.500)

## 2022-10-07 ENCOUNTER — Encounter: Payer: Self-pay | Admitting: Neurology

## 2022-10-11 ENCOUNTER — Other Ambulatory Visit: Payer: Self-pay | Admitting: Neurology

## 2022-10-11 MED ORDER — ALPRAZOLAM 0.25 MG PO TABS: ORAL_TABLET | ORAL | 0 refills | Status: DC

## 2022-10-18 ENCOUNTER — Ambulatory Visit
Admission: RE | Admit: 2022-10-18 | Discharge: 2022-10-18 | Disposition: A | Discharge status: Home / Self Care | Payer: Medicare Other | Source: Ambulatory Visit | Attending: Neurology | Admitting: Neurology

## 2022-10-18 ENCOUNTER — Encounter: Payer: Self-pay | Admitting: Hematology and Oncology

## 2022-10-18 DIAGNOSIS — Z820 Family history of epilepsy and other diseases of the nervous system: Secondary | ICD-10-CM

## 2022-10-18 DIAGNOSIS — R410 Disorientation, unspecified: Secondary | ICD-10-CM

## 2022-10-18 DIAGNOSIS — R4189 Other symptoms and signs involving cognitive functions and awareness: Secondary | ICD-10-CM

## 2022-10-18 DIAGNOSIS — I679 Cerebrovascular disease, unspecified: Secondary | ICD-10-CM

## 2022-10-18 DIAGNOSIS — F09 Unspecified mental disorder due to known physiological condition: Secondary | ICD-10-CM

## 2022-10-18 DIAGNOSIS — R413 Other amnesia: Secondary | ICD-10-CM

## 2022-10-18 MED ORDER — GADOPICLENOL 0.5 MMOL/ML IV SOLN
6.0000 mL | Freq: Once | INTRAVENOUS | Status: AC | PRN
  Administered 2022-10-18: 6 mL via INTRAVENOUS

## 2022-10-19 ENCOUNTER — Encounter: Payer: Self-pay | Admitting: Hematology and Oncology

## 2022-10-19 ENCOUNTER — Ambulatory Visit (INDEPENDENT_AMBULATORY_CARE_PROVIDER_SITE_OTHER): Payer: Medicare Other | Admitting: Neurology

## 2022-10-19 DIAGNOSIS — R410 Disorientation, unspecified: Secondary | ICD-10-CM | POA: Diagnosis not present

## 2022-10-19 DIAGNOSIS — F09 Unspecified mental disorder due to known physiological condition: Secondary | ICD-10-CM

## 2022-10-19 DIAGNOSIS — R413 Other amnesia: Secondary | ICD-10-CM

## 2022-10-19 DIAGNOSIS — Z820 Family history of epilepsy and other diseases of the nervous system: Secondary | ICD-10-CM

## 2022-10-19 DIAGNOSIS — R4189 Other symptoms and signs involving cognitive functions and awareness: Secondary | ICD-10-CM

## 2022-10-19 DIAGNOSIS — I679 Cerebrovascular disease, unspecified: Secondary | ICD-10-CM

## 2022-10-20 NOTE — Procedures (Signed)
    History:  68 year old woman with cognitive impairment   EEG classification: Awake and drowsy  Description of the recording: The background rhythms of this recording consists of a fairly well modulated medium amplitude alpha rhythm of 11 Hz that is reactive to eye opening and closure. Present in the anterior head region is a 15-20 Hz beta activity. Photic stimulation was performed, did not show any abnormalities. Hyperventilation was also performed, did not show any abnormalities. Drowsiness was manifested by background fragmentation. No abnormal epileptiform discharges seen during this recording. There was no focal slowing. There were no electrographic seizure identified.   Abnormality: None   Impression: This is a normal EEG recorded while drowsy and awake. No evidence of interictal epileptiform discharges. Normal EEGs, however, do not rule out epilepsy.    Windell Norfolk, MD [facility]

## 2022-10-29 LAB — ATN PROFILE
A -- Beta-amyloid 42/40 Ratio: 0.104 (ref >0.102)
Beta-amyloid 40: 152.25 pg/mL
Beta-amyloid 42: 15.79 pg/mL
N -- NfL, Plasma: 2.82 pg/mL (ref 0.00–4.61)
T -- p-tau181: 0.75 pg/mL (ref 0.00–0.97)

## 2022-10-29 LAB — COMPREHENSIVE METABOLIC PANEL
ALT: 11 IU/L (ref 0–32)
AST: 15 IU/L (ref 0–40)
Albumin/Globulin Ratio: 2.6 — ABNORMAL HIGH (ref 1.2–2.2)
Albumin: 4.9 g/dL (ref 3.9–4.9)
Alkaline Phosphatase: 47 IU/L (ref 44–121)
BUN/Creatinine Ratio: 14 (ref 12–28)
BUN: 13 mg/dL (ref 8–27)
Bilirubin Total: 0.3 mg/dL (ref 0.0–1.2)
CO2: 27 mmol/L (ref 20–29)
Calcium: 9.8 mg/dL (ref 8.7–10.3)
Chloride: 101 mmol/L (ref 96–106)
Creatinine, Ser: 0.93 mg/dL (ref 0.57–1.00)
Globulin, Total: 1.9 g/dL (ref 1.5–4.5)
Glucose: 90 mg/dL (ref 70–99)
Potassium: 4.4 mmol/L (ref 3.5–5.2)
Sodium: 143 mmol/L (ref 134–144)
Total Protein: 6.8 g/dL (ref 6.0–8.5)
eGFR: 68 mL/min/{1.73_m2} (ref >59)

## 2022-10-29 LAB — CBC WITH DIFFERENTIAL/PLATELET
Basophils Absolute: 0 10*3/uL (ref 0.0–0.2)
Basos: 1 %
EOS (ABSOLUTE): 0.1 10*3/uL (ref 0.0–0.4)
Eos: 1 %
Hematocrit: 43.2 % (ref 34.0–46.6)
Hemoglobin: 14.1 g/dL (ref 11.1–15.9)
Immature Grans (Abs): 0 10*3/uL (ref 0.0–0.1)
Immature Granulocytes: 0 %
Lymphocytes Absolute: 1.5 10*3/uL (ref 0.7–3.1)
Lymphs: 29 %
MCH: 29.4 pg (ref 26.6–33.0)
MCHC: 32.6 g/dL (ref 31.5–35.7)
MCV: 90 fL (ref 79–97)
Monocytes Absolute: 0.5 10*3/uL (ref 0.1–0.9)
Monocytes: 9 %
Neutrophils Absolute: 3.1 10*3/uL (ref 1.4–7.0)
Neutrophils: 60 %
Platelets: 234 10*3/uL (ref 150–450)
RBC: 4.79 x10E6/uL (ref 3.77–5.28)
RDW: 13 % (ref 11.7–15.4)
WBC: 5.3 10*3/uL (ref 3.4–10.8)

## 2022-10-29 LAB — APOE ALZHEIMER'S RISK

## 2022-10-29 LAB — B12 AND FOLATE PANEL
Folate: 14.7 ng/mL (ref >3.0)
Vitamin B-12: 380 pg/mL (ref 232–1245)

## 2022-10-29 LAB — METHYLMALONIC ACID, SERUM: Methylmalonic Acid: 161 nmol/L (ref 0–378)

## 2022-10-29 LAB — HOMOCYSTEINE: Homocysteine: 10.5 umol/L (ref 0.0–17.2)

## 2022-10-29 LAB — VITAMIN B1: Thiamine: 112.6 nmol/L (ref 66.5–200.0)

## 2022-11-11 ENCOUNTER — Telehealth: Payer: Self-pay | Admitting: *Deleted

## 2022-11-11 NOTE — Telephone Encounter (Signed)
I called pt and relayed her results of her labs : per Dr. Lucia Gaskins " You have NO markers of alzheimers in your blood. This is great news. 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 And other labs are fine, Happy week!  "      She will look in mychart for her results.  She verbalized understanding.

## 2022-11-11 NOTE — Telephone Encounter (Signed)
-----   Message from Anson Fret, MD sent at 11/06/2022 11:14 AM EDT ----- She has not read her results please call :  Hi Candace Campbell, You are NO markers of alzheimers in your blood. This is great news. 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 Ans other labs are fine, Happy week! Dr. Lucia Gaskins

## 2022-11-14 ENCOUNTER — Encounter: Payer: Self-pay | Admitting: Neurology

## 2022-12-21 ENCOUNTER — Other Ambulatory Visit: Payer: Self-pay | Admitting: Rheumatology

## 2022-12-21 DIAGNOSIS — M8580 Other specified disorders of bone density and structure, unspecified site: Secondary | ICD-10-CM

## 2023-01-27 ENCOUNTER — Inpatient Hospital Stay: Admission: RE | Admit: 2023-01-27 | Payer: Medicare Other | Source: Ambulatory Visit

## 2023-03-10 ENCOUNTER — Telehealth: Payer: Self-pay | Admitting: Neurology

## 2023-03-10 NOTE — Telephone Encounter (Signed)
..   Pt understands that although there may be some limitations with this type of visit, we will take all precautions to reduce any security or privacy concerns.  Pt understands that this will be treated like an in office visit and we will file with pt's insurance, and there may be a patient responsible charge related to this service. ? ?

## 2023-03-14 ENCOUNTER — Telehealth (INDEPENDENT_AMBULATORY_CARE_PROVIDER_SITE_OTHER): Payer: Medicare Other | Admitting: Neurology

## 2023-03-14 DIAGNOSIS — R413 Other amnesia: Secondary | ICD-10-CM | POA: Diagnosis not present

## 2023-03-14 DIAGNOSIS — Z820 Family history of epilepsy and other diseases of the nervous system: Secondary | ICD-10-CM

## 2023-03-14 NOTE — Progress Notes (Unsigned)
[facility]    Provider:  Dr Lucia Gaskins Requesting Provider: Shon Hale, * Primary Care Provider:  Shon Hale, MD  Virtual Visit via Video Note  I connected with Candace Campbell on 03/16/23 at 11:00 AM EDT by a video enabled telemedicine application and verified that I am speaking with the correct person using two identifiers.  Location: Patient: home Provider: office   I discussed the limitations of evaluation and management by telemedicine and the availability of in person appointments. The patient expressed understanding and agreed to proceed.    I discussed the assessment and treatment plan with the patient. The patient was provided an opportunity to ask questions and all were answered. The patient agreed with the plan and demonstrated an understanding of the instructions.   The patient was advised to call back or seek an in-person evaluation if the symptoms worsen or if the condition fails to improve as anticipated.  I provided 10 minutes of non-face-to-face time during this encounter.   Anson Fret, MD   CC:  Memory concerns  10/04/2022: Worse about losing things and immediately forgetting what she was doing, she missed her last appointment. She will run into someone she knows and forgets her name. Hard time spelling, can't remember her words. She has gone to he work missing things. Hard time with math. Progressive memory loss. Having problems adding. She was testing for ADD and does not have it.  She was tested just for ADD NOT formal memory testing at least 4-5 years ago. Saw her in 2021 also concerned at that time for memory loss. Yesterday she was going to the gym, she goes every morning, she is stronger, feeling very positive about that, anxiety and stress better she has situational anxiety her son and grandson lives with them and there are social problems in her life for years. But her anxiety has improved over the years, her son has  gotten much better and doing better and better dad and things are better at home. Worsened the last year. Mother had dementia and started in her 33s and 64s and in 75s was in a memory care unit. She has episodes she cannot remember at all. Forgets where she is going. She has gotten lost. No other focal neurologic deficits, associated symptoms, inciting events or modifiable factors.  Patient complains of symptoms per HPI as well as the following symptoms: none . Pertinent negatives and positives per HPI. All others negative   Reviewed notes, labs and imaging from outside physicians, which showed:     Latest Ref Rng & Units 10/04/2022   10:12 AM 04/15/2020   10:34 AM 06/09/2018    9:53 AM  CMP  Glucose 70 - 99 mg/dL 90  82  78   BUN 8 - 27 mg/dL 13  16  17    Creatinine 0.57 - 1.00 mg/dL 1.61  0.96  0.45   Sodium 134 - 144 mmol/L 143  140  140   Potassium 3.5 - 5.2 mmol/L 4.4  4.5  4.6   Chloride 96 - 106 mmol/L 101  101  105   CO2 20 - 29 mmol/L 27  28  28    Calcium 8.7 - 10.3 mg/dL 9.8  9.3  9.1   Total Protein 6.0 - 8.5 g/dL 6.8  6.1  6.5   Total Bilirubin 0.0 - 1.2 mg/dL 0.3  0.3  0.4   Alkaline Phos 44 - 121 IU/L 47  48  44   AST 0 -  40 IU/L 15  24  19    ALT 0 - 32 IU/L 11  17  15        Latest Ref Rng & Units 10/04/2022   10:12 AM 04/15/2020   10:34 AM 06/09/2018    9:53 AM  CBC  WBC 3.4 - 10.8 x10E3/uL 5.3  4.2  5.1   Hemoglobin 11.1 - 15.9 g/dL 16.1  09.6  04.5   Hematocrit 34.0 - 46.6 % 43.2  38.9  40.4   Platelets 150 - 450 x10E3/uL 234  239  194      Patient complains of symptoms per HPI as well as the following symptoms: none . Pertinent negatives and positives per HPI. All others negative  HPI 04/15/2020 :  ROSANA DENHART is a 67 y.o. female here as requested by Shon Hale, * for word loss and short-term memory loss. PMHx depression and anxiety. I reviewed Dr. Gloris Manchester notes, last seen for chief complaint "my mother passed away and my anxiety has been terrible".   Patient last reported that her mother passed away and her anxiety had been terrible, medications are working well, occasional depressive symptoms of low energy, lack of motivation, irritability, crying spells due to thinking about the loss of her mother, having difficulty with finding words and short-term memory which she is requesting referral to neurology and therapy, denies suicidal or homicidal ideations, hallucinations or delusions, she was on Strattera for ADHD but asked to be weaned off of that, diagnosed by Dr. 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  She started noticing symptoms "a long time ago", she puts letters in the wrong position, someone will tell her something and she has no recollection of it, seems so much worse this year, her mother had dementia unknown type started around 71 and she dies at the age of 8. 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 She will make mistakes on orders. She sent her invoice in and she will have added it up wrong, getting frustrating. She already had formal neurocognitive testing and diagnosed with anxiety not ADHD. She has been forgetful since a cild, she would forget tings in her locker, short-term memory loss is not new but the last 6-7 years it has worse with short-term memory loss, concentrating, she has problems getting words out and feels really slow she has to really think about it. She forgets the words of things at home. She has sleep apnea and uses a sleep appliance.   Reviewed notes, labs and imaging from outside physicians, which showed: see above  Review of Systems: Patient complains of symptoms per HPI as well as the  following symptoms: anxiety. Pertinent negatives and positives per HPI. All others negative.   Social History   Socioeconomic History   Marital status: Married    Spouse name: Candace Campbell   Number of children: 2   Years of education: Not on file   Highest education level: Bachelor's degree (e.g., BA, AB, BS)  Occupational History   Not on file  Tobacco Use   Smoking status: Never   Smokeless tobacco: Never  Vaping Use   Vaping status: Never Used  Substance and Sexual Activity   Alcohol use: Yes  Alcohol/week: 1.0 standard drink of alcohol    Types: 1 Standard drinks or equivalent per week    Comment: social   Drug use: No   Sexual activity: Yes    Birth control/protection: Surgical  Other Topics Concern   Not on file  Social History Narrative   Lives at home with spouse. Son and grandson live with them.   Right handed   Caffeine: occasional green tea 1 cup   Social Determinants of Health   Financial Resource Strain: Not on file  Food Insecurity: Not on file  Transportation Needs: Not on file  Physical Activity: Not on file  Stress: Not on file  Social Connections: Not on file  Intimate Partner Violence: Not on file    Family History  Problem Relation Age of Onset   Dementia Mother    Heart disease Father    Heart attack Father    Hyperlipidemia Sister    Breast cancer Maternal Grandmother 61    Past Medical History:  Diagnosis Date   Anxiety    Blood dyscrasia    states daughter has Von Willebrand and she was tested 03-19-15 by Dr Pamelia Hoit   Breast cancer [facility]) 03/13/2015   right breast    Coronary atherosclerosis due to calcified coronary lesion    Depression    GERD (gastroesophageal reflux disease)    Headache    HX MIGRAINES   History of kidney stones    Hyperlipidemia    OSA (obstructive sleep apnea)    Osteopenia    per notes from [facility]    Personal history of radiation therapy    PONV (postoperative nausea and vomiting)      Patient Active Problem List   Diagnosis Date Noted   HNP (herniated nucleus pulposus), lumbar 03/03/2016   At high risk for bleeding 03/19/2015   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 female breast (HCC) 03/17/2015    Past Surgical History:  Procedure Laterality Date   ABDOMINAL HYSTERECTOMY     BREAST BIOPSY Right 2016   BREAST LUMPECTOMY Right 2016   BREAST LUMPECTOMY WITH RADIOACTIVE SEED AND SENTINEL LYMPH NODE BIOPSY Right 03/26/2015   Procedure: BREAST LUMPECTOMY WITH RADIOACTIVE SEED AND SENTINEL LYMPH NODE BIOPSY;  Surgeon: Almond Lint, MD;  Location: [facility];  Service: General;  Laterality: Right;   BUNIONECTOMY     BILAT   LUMBAR LAMINECTOMY/DECOMPRESSION MICRODISCECTOMY Right 03/03/2016   Procedure: RIGHT LUMBAR FOUR-FIVE LUMBAR LAMINECTOMYAND MICRODISCECTOMY;  Surgeon: Shirlean Kelly, MD;  Location: [facility];  Service: Neurosurgery;  Laterality: Right;  right    Current Outpatient Medications  Medication Sig Dispense Refill   ALPRAZolam (XANAX) 0.25 MG tablet Take 1-2 tabs (0.25mg -0.50mg ) 30-60 minutes before procedure. May repeat if needed.Do not drive. 4 tablet 0   aspirin EC 81 MG tablet Take 1 tablet (81 mg total) by mouth daily. Swallow whole. 30 tablet 11   atorvastatin (LIPITOR) 10 MG tablet Take 10 mg by mouth daily.     azelastine (ASTELIN) 0.1 % nasal spray U 1 SPR IEN BID     busPIRone (BUSPAR) 15 MG tablet TK 1 AND 1/2 T PO AM AND TK 2 TS QHS FOR ANXIETY PRN. MAY TAKE NOON DOSE IF NEEDED FOR SEVERE ANXIETY     Calcium-Magnesium-Vitamin D (CALCIUM 500 PO) Take 1 tablet by mouth daily.     Denosumab (PROLIA [place]) Inject into the skin every 6 (six) months.     DULoxetine (CYMBALTA) 60 MG capsule Take  60 mg by mouth daily.     lamoTRIgine (LAMICTAL) 200 MG tablet Take 200 mg by mouth at bedtime.      Multiple Vitamin (MULTIVITAMIN WITH MINERALS) TABS tablet Take 1 tablet by mouth daily.     Omega-3 Fatty Acids (FISH OIL PO) Take  by mouth.     polyethylene glycol (MIRALAX / GLYCOLAX) packet Take 17 g by mouth daily as needed.     TRAZODONE HCL PO Take 50 mg by mouth at bedtime as needed. Takes every night     Vitamin D, Cholecalciferol, 1000 units CAPS Take 1 tablet by mouth daily.     Zinc 50 MG TABS Take by mouth.     No current facility-administered medications for this visit.    Allergies as of 03/14/2023 - Review Complete 10/04/2022  Allergen Reaction Noted   Latex Swelling 03/19/2015   Clindamycin/lincomycin Hives 06/24/2015   Peanut-containing drug products Other (See Comments) 03/29/2015   Sulfa antibiotics Other (See Comments) 03/19/2015   Iodine Swelling and Rash 03/29/2015    Vitals: There were no vitals taken for this visit. Last Weight:  Wt Readings from Last 1 Encounters:  10/04/22 127 lb (57.6 kg)   Last Height:   Ht Readings from Last 1 Encounters:  10/04/22 5\' 5"  (1.651 m)    Physical exam: Exam: Gen: NAD, conversant      CV: Denies palpitations or chest pain or SOB. VS: Breathing at a normal rate. Not febrile. Eyes: Conjunctivae clear without exudates or hemorrhage  Neuro: Detailed Neurologic Exam  Speech:    Speech is normal; fluent and spontaneous with normal comprehension.  Cognition:    The patient is oriented to person, place, and time;     recent and remote memory intact;     language fluent;     normal attention, concentration,     fund of knowledge Cranial Nerves:    The pupils are equal, round, and reactive to light. Jill Alexanders fields are full. Extraocular movements are intact.  The face is symmetric with normal sensation. Hearing intact. Voice is normal. Shoulder shrug is normal. The tongue has normal motion without fasciculations.   Coordination: nml  Gait: nml  Motor Observation:   no involuntary movements noted. Tone:    Appears normal  Posture:    Posture is normal. normal erect    Strength:    Strength is anti-gravity and symmetric in the upper  and lower limbs.      Sensation: intact to LT         Assessment/Plan:  66 with Fhx dementia, progressive cognitive decline, even though MMSE is 29 her MoCA is 24. Need a workup, discussed in detail, discussed new drug, teasting, dementia in general.  All workup normal.   She is feeling well. Would just like a year follow up.   All wkup normal/unremarkable:  Address Sleep Apnea or at least ensure it is treated. Formal memory testing - Dr. Clayborn Heron IF ATN Profile comes back abnormal(abnormal alzheimers proteins in the blood) I order PET scan to look for amyloid in the brain Alzheimer's gene EEG MRI brain  Blood work   No orders of the defined types were placed in this encounter.    Cc: Allena Katz, MD  Naomie Dean, MD  [facility] [address] [place], [place] [address]  Phone [phone] Fax [phone]

## 2023-03-16 ENCOUNTER — Telehealth: Payer: Self-pay | Admitting: Neurology

## 2023-03-16 ENCOUNTER — Encounter: Payer: Self-pay | Admitting: Neurology

## 2023-03-16 NOTE — Telephone Encounter (Signed)
1 year with dr Lucia Gaskins 1pm appt one hour, have office call please ty

## 2023-03-17 NOTE — Telephone Encounter (Signed)
LVM and sent mychart msg asking pt to call back to schedule follow up with Dr. Lucia Gaskins

## 2023-06-07 ENCOUNTER — Other Ambulatory Visit: Payer: Self-pay | Admitting: Obstetrics and Gynecology

## 2023-06-07 DIAGNOSIS — Z0000 Encounter for general adult medical examination without abnormal findings: Secondary | ICD-10-CM

## 2023-06-13 ENCOUNTER — Telehealth: Payer: Self-pay | Admitting: Neurology

## 2023-06-13 NOTE — Telephone Encounter (Signed)
Pt confirming next appt with Dr. Lucia Gaskins.

## 2023-06-24 ENCOUNTER — Ambulatory Visit: Payer: Self-pay | Admitting: Cardiology

## 2023-07-14 ENCOUNTER — Ambulatory Visit
Admission: RE | Admit: 2023-07-14 | Discharge: 2023-07-14 | Disposition: A | Discharge status: Home / Self Care | Payer: Medicare Other | Source: Ambulatory Visit | Attending: Obstetrics and Gynecology | Admitting: Obstetrics and Gynecology

## 2023-07-14 DIAGNOSIS — Z0000 Encounter for general adult medical examination without abnormal findings: Secondary | ICD-10-CM

## 2023-07-22 ENCOUNTER — Ambulatory Visit: Payer: Medicare Other | Attending: Cardiology | Admitting: Cardiology

## 2023-07-27 ENCOUNTER — Other Ambulatory Visit: Payer: Medicare Other

## 2023-07-27 ENCOUNTER — Ambulatory Visit
Admission: RE | Admit: 2023-07-27 | Discharge: 2023-07-27 | Disposition: A | Discharge status: Home / Self Care | Payer: Medicare Other | Source: Ambulatory Visit | Attending: Rheumatology | Admitting: Rheumatology

## 2023-07-27 DIAGNOSIS — M8580 Other specified disorders of bone density and structure, unspecified site: Secondary | ICD-10-CM

## 2023-08-31 ENCOUNTER — Ambulatory Visit (INDEPENDENT_AMBULATORY_CARE_PROVIDER_SITE_OTHER): Payer: Medicare Other | Admitting: Otolaryngology

## 2023-08-31 ENCOUNTER — Encounter (INDEPENDENT_AMBULATORY_CARE_PROVIDER_SITE_OTHER): Payer: Self-pay | Admitting: Otolaryngology

## 2023-08-31 VITALS — BP 135/77 | HR 77 | Ht 63.5 in | Wt 132.0 lb

## 2023-08-31 DIAGNOSIS — J3089 Other allergic rhinitis: Secondary | ICD-10-CM

## 2023-08-31 DIAGNOSIS — Z789 Other specified health status: Secondary | ICD-10-CM

## 2023-08-31 DIAGNOSIS — R0981 Nasal congestion: Secondary | ICD-10-CM

## 2023-08-31 DIAGNOSIS — G4733 Obstructive sleep apnea (adult) (pediatric): Secondary | ICD-10-CM

## 2023-08-31 DIAGNOSIS — R0982 Postnasal drip: Secondary | ICD-10-CM

## 2023-08-31 DIAGNOSIS — Z91198 Patient's noncompliance with other medical treatment and regimen for other reason: Secondary | ICD-10-CM

## 2023-08-31 NOTE — Progress Notes (Unsigned)
 ENT CONSULT:  Reason for Consult: OSA CPAP intolerance    Ref: Dr Dwyane Luo Sleep   HPI: Discussed the use of AI scribe software for clinical note transcription with the patient, who gave verbal consent to proceed.  History of Present Illness   Candace Campbell is a 68 year old female with obstructive sleep apnea who presents with interest in alternatives to CPAP therapy. She was referred by Dr. Ermalinda Memos for [facility] implant evaluation.  She has a history of obstructive sleep apnea, initially diagnosed in 2019, characterized by persistent snoring. Initially, she used a mouthpiece, which became unaffordable to replace after wearing out. She attempted CPAP therapy but faced difficulties with mask fitting, resulting in discomfort and bloating from air intake. Despite trying various masks, she was unable to find a suitable fit, experiencing significant discomfort, including bruising and bloating, which prevented effective use of the CPAP.  In June 2024, she underwent a home sleep study, which included one night with an oral appliance and one night without and reports AHI of ~ 37 (we don't have records for review). This was prompted by memory problems and a recommendation following dementia testing, which she does not currently have but has a genetic predisposition for. The study was suggested to assess if her sleep quality was contributing to her memory issues.  She has a history of insomnia and takes trazodone, though she has reduced her dose since starting clonidine. Without medication, she has difficulty falling and staying asleep, often waking up hourly. Her sleep quality was better when using the oral appliance previously.  Her BMI was 21 in 2019, and she weighed 130 pounds at that time. She has experienced some weight fluctuation, currently weighing 132 pounds after gaining weight over Christmas, though she was 126 pounds for a period. No history of stroke or heart attack. No history of being on  blood thinners.       Past Medical History:  Diagnosis Date   Anxiety    Blood dyscrasia    states daughter has Von Willebrand and she was tested 03-19-15 by Dr Pamelia Hoit   Breast cancer [facility] 03/13/2015   right breast    Coronary atherosclerosis due to calcified coronary lesion    Depression    GERD (gastroesophageal reflux disease)    Headache    HX MIGRAINES   History of kidney stones    Hyperlipidemia    OSA (obstructive sleep apnea)    Osteopenia    per notes from [facility]    Personal history of radiation therapy    PONV (postoperative nausea and vomiting)     Past Surgical History:  Procedure Laterality Date   ABDOMINAL HYSTERECTOMY     BREAST BIOPSY Right 2016   BREAST LUMPECTOMY Right 2016   BREAST LUMPECTOMY WITH RADIOACTIVE SEED AND SENTINEL LYMPH NODE BIOPSY Right 03/26/2015   Procedure: BREAST LUMPECTOMY WITH RADIOACTIVE SEED AND SENTINEL LYMPH NODE BIOPSY;  Surgeon: Almond Lint, MD;  Location: [facility];  Service: General;  Laterality: Right;   BUNIONECTOMY     BILAT   LUMBAR LAMINECTOMY/DECOMPRESSION MICRODISCECTOMY Right 03/03/2016   Procedure: RIGHT LUMBAR FOUR-FIVE LUMBAR LAMINECTOMYAND MICRODISCECTOMY;  Surgeon: Shirlean Kelly, MD;  Location: [facility];  Service: Neurosurgery;  Laterality: Right;  right    Family History  Problem Relation Age of Onset   Dementia Mother    Heart disease Father    Heart attack Father    Hyperlipidemia Sister    Breast cancer Maternal Grandmother  42    Social History:  reports that she has never smoked. She has never used smokeless tobacco. She reports current alcohol use of about 1.0 standard drink of alcohol per week. She reports that she does not use drugs.  Allergies:  Allergies  Allergen Reactions   Latex Swelling   Clindamycin/Lincomycin Hives   Peanut-Containing Drug Products Other (See Comments)    Headache/migraine   Sulfa Antibiotics Other (See Comments)     UNSPECIFIED REACTION  Pt does not remember the symptoms "They told me I should not take it anymore".   Zolpidem Other (See Comments)    Other Reaction(s): nightmares, extreme sedation   Iodine Swelling and Rash    Including Betadine     Medications: I have reviewed the patient's current medications.  The PMH, PSH, Medications, Allergies, and SH were reviewed and updated.  ROS: Constitutional: Negative for fever, weight loss and weight gain. Cardiovascular: Negative for chest pain and dyspnea on exertion. Respiratory: Is not experiencing shortness of breath at rest. Gastrointestinal: Negative for nausea and vomiting. Neurological: Negative for headaches. Psychiatric: The patient is not nervous/anxious  Blood pressure 135/77, pulse 77, height 5' 3.5" (1.613 m), weight 132 lb (59.9 kg), SpO2 97%. Body mass index is 23.02 kg/m.  PHYSICAL EXAM:  Exam: General: Well-developed, well-nourished Respiratory Respiratory effort: Equal inspiration and expiration without stridor Cardiovascular Peripheral Vascular: Warm extremities with equal color/perfusion Eyes: No nystagmus with equal extraocular motion bilaterally Neuro/Psych/Balance: Patient oriented to person, place, and time; Appropriate mood and affect; Gait is intact with no imbalance; Cranial nerves I-XII are intact Head and Face Inspection: Normocephalic and atraumatic without mass or lesion Palpation: Facial skeleton intact without bony stepoffs Salivary Glands: No mass or tenderness Facial Strength: Facial motility symmetric and full bilaterally ENT Pinna: External ear intact and fully developed External canal: Canal is patent with intact skin Tympanic Membrane: Clear and mobile External Nose: No scar or anatomic deformity Internal Nose: Septum is deviated to the left. No polyp, or purulence. Mucosal edema and erythema present.  Bilateral inferior turbinate hypertrophy.  Lips, Teeth, and gums: Mucosa and teeth intact and  viable TMJ: No pain to palpation with full mobility Oral cavity/oropharynx: No erythema or exudate, no lesions present Nasopharynx: No mass or lesion with intact mucosa Hypopharynx: Intact mucosa without pooling of secretions Larynx Glottic: Full true vocal cord mobility without lesion or mass Supraglottic: Normal appearing epiglottis and AE folds Interarytenoid Space: Moderate pachydermia&edema Subglottic Space: Patent without lesion or edema Neck Neck and Trachea: Midline trachea without mass or lesion Thyroid: No mass or nodularity Lymphatics: No lymphadenopathy  Procedure: Preoperative diagnosis: OSA CPAP intolerance moderate  Postoperative diagnosis:   Same + GERD LPR  Procedure: Flexible fiberoptic laryngoscopy  Surgeon: Ashok Croon, MD  Anesthesia: Topical lidocaine and Afrin Complications: None Condition is stable throughout exam  Indications and consent:  The patient presents to the clinic with above symptoms. Indirect laryngoscopy view was incomplete. Thus it was recommended that they undergo a flexible fiberoptic laryngoscopy. All of the risks, benefits, and potential complications were reviewed with the patient preoperatively and verbal informed consent was obtained.  Procedure: The patient was seated upright in the clinic. Topical lidocaine and Afrin were applied to the nasal cavity. After adequate anesthesia had occurred, I then proceeded to pass the flexible telescope into the nasal cavity. The nasal cavity was patent without rhinorrhea or polyp. The nasopharynx was also patent without mass or lesion. The base of tongue was visualized and was normal. There were  no signs of pooling of secretions in the piriform sinuses. The true vocal folds were mobile bilaterally. There were no signs of glottic or supraglottic mucosal lesion or mass. There was moderate interarytenoid pachydermia and post cricoid edema. The telescope was then slowly withdrawn and the patient tolerated  the procedure throughout.      Studies Reviewed: Home Sleep Study 12/19/2017  BMI 21.7 AHI 12.8 no mixed or central apneas   Home Sleep test 12/2022 [facility] - need to request report  Assessment/Plan: Encounter Diagnoses  Name Primary?   OSA (obstructive sleep apnea) Yes   Intolerance of continuous positive airway pressure (CPAP) ventilation     Assessment and Plan    Obstructive Sleep Apnea (OSA) OSA diagnosed in 2019. CPAP and oral appliance therapy were ineffective due to mask fit issues, side effects. Recent home sleep test (June 2024) indicated AHI of 37 per patient report. Need to request records of the most recent sleep study. No central or mixed events on the study done in 2019. BMI is not a concern for insurance approval (BMI of 23 today). Insomnia managed with trazodone and clonidine, but slept better when oral appliance was in place. Discussed Inspire implant: stimulates hypoglossal nerve to reduce obstruction. Procedure involves two incisions, device placement, and wire tunneling. Advised to avoid strenuous activities for one month post-procedure. Risks and benefits discussed and she would like to proceed.  OSA, moderate to severe, without multilevel collapse, with failure to tolerate PAP therapy and/or more conservative measures. Presence of smaller/absent tonsils and larger tongue position (Friedman tongue position or modified Mallampati) suggests that hypopharyngeal/retrolingual collapse is contributing to the patient's OSA. Zachery Conch, M et al. Staging of obstructive sleep apnea/hypopnea syndrome: a guide to appropriate treatment. Laryngoscope, 2004 Mar, 114(3):454-9. PMID: [id number]) Options including positional therapy, weight loss, oral appliances, PAP and surgical correction discussed. Pt is not ideal candidate for oral appliance due to severity of OSA on most recent sleep study. Pt could be a candidate for Hypoglossal nerve stimulation (Inspire therapy) pending DISE    - Request sleep study results from [facility] - Schedule drug-induced sleep endoscopy (DISE) to assess for complete concentric collapse - Proceed with insurance preapproval process for Inspire implant - Schedule Inspire implant procedure if DISE is favorable - Advised to avoid strenuous activities for one month post-procedure - Provide Inspire implant brochure  Insomnia Chronic difficulty falling and staying asleep. Managed with trazodone and clonidine. Insomnia may contribute to poor sleep quality but is not the primary issue given OSA diagnosis. - Continue trazodone and clonidine  Breast Cancer Right-sided lumpectomy with hematoma formation. No current issues. Known bleeder. Discussed potential for hematoma formation post-Inspire implant surgery. - Monitor for hematoma formation post-Inspire implant surgery  Chronic nasal congestion and Environmental Allergies  Allergy management with azelastine spray. - Continue azelastine spray - unable to tolerate antihistamines in the past, will hold off for now  Follow-up - Follow up with surgery schedulers to set up DISE    Thank you for allowing me to participate in the care of this patient. Please do not hesitate to contact me with any questions or concerns.   Ashok Croon, MD Otolaryngology [facility] Phone: [phone] Fax: [phone]    09/01/2023, 5:17 AM

## 2023-08-31 NOTE — H&P (View-Only) (Signed)
 ENT CONSULT:  Reason for Consult: OSA CPAP intolerance    Ref: Dr Dwyane Luo Sleep   HPI: Discussed the use of AI scribe software for clinical note transcription with the patient, who gave verbal consent to proceed.  History of Present Illness   Candace Campbell is a 68 year old female with obstructive sleep apnea who presents with interest in alternatives to CPAP therapy. She was referred by Dr. Ermalinda Memos for [facility] implant evaluation.  She has a history of obstructive sleep apnea, initially diagnosed in 2019, characterized by persistent snoring. Initially, she used a mouthpiece, which became unaffordable to replace after wearing out. She attempted CPAP therapy but faced difficulties with mask fitting, resulting in discomfort and bloating from air intake. Despite trying various masks, she was unable to find a suitable fit, experiencing significant discomfort, including bruising and bloating, which prevented effective use of the CPAP.  In June 2024, she underwent a home sleep study, which included one night with an oral appliance and one night without and reports AHI of ~ 37 (we don't have records for review). This was prompted by memory problems and a recommendation following dementia testing, which she does not currently have but has a genetic predisposition for. The study was suggested to assess if her sleep quality was contributing to her memory issues.  She has a history of insomnia and takes trazodone, though she has reduced her dose since starting clonidine. Without medication, she has difficulty falling and staying asleep, often waking up hourly. Her sleep quality was better when using the oral appliance previously.  Her BMI was 21 in 2019, and she weighed 130 pounds at that time. She has experienced some weight fluctuation, currently weighing 132 pounds after gaining weight over Christmas, though she was 126 pounds for a period. No history of stroke or heart attack. No history of being on  blood thinners.       Past Medical History:  Diagnosis Date   Anxiety    Blood dyscrasia    states daughter has Von Willebrand and she was tested 03-19-15 by Dr Pamelia Hoit   Breast cancer [facility] 03/13/2015   right breast    Coronary atherosclerosis due to calcified coronary lesion    Depression    GERD (gastroesophageal reflux disease)    Headache    HX MIGRAINES   History of kidney stones    Hyperlipidemia    OSA (obstructive sleep apnea)    Osteopenia    per notes from [facility]    Personal history of radiation therapy    PONV (postoperative nausea and vomiting)     Past Surgical History:  Procedure Laterality Date   ABDOMINAL HYSTERECTOMY     BREAST BIOPSY Right 2016   BREAST LUMPECTOMY Right 2016   BREAST LUMPECTOMY WITH RADIOACTIVE SEED AND SENTINEL LYMPH NODE BIOPSY Right 03/26/2015   Procedure: BREAST LUMPECTOMY WITH RADIOACTIVE SEED AND SENTINEL LYMPH NODE BIOPSY;  Surgeon: Almond Lint, MD;  Location: [facility];  Service: General;  Laterality: Right;   BUNIONECTOMY     BILAT   LUMBAR LAMINECTOMY/DECOMPRESSION MICRODISCECTOMY Right 03/03/2016   Procedure: RIGHT LUMBAR FOUR-FIVE LUMBAR LAMINECTOMYAND MICRODISCECTOMY;  Surgeon: Shirlean Kelly, MD;  Location: [facility];  Service: Neurosurgery;  Laterality: Right;  right    Family History  Problem Relation Age of Onset   Dementia Mother    Heart disease Father    Heart attack Father    Hyperlipidemia Sister    Breast cancer Maternal Grandmother  42    Social History:  reports that she has never smoked. She has never used smokeless tobacco. She reports current alcohol use of about 1.0 standard drink of alcohol per week. She reports that she does not use drugs.  Allergies:  Allergies  Allergen Reactions   Latex Swelling   Clindamycin/Lincomycin Hives   Peanut-Containing Drug Products Other (See Comments)    Headache/migraine   Sulfa Antibiotics Other (See Comments)     UNSPECIFIED REACTION  Pt does not remember the symptoms "They told me I should not take it anymore".   Zolpidem Other (See Comments)    Other Reaction(s): nightmares, extreme sedation   Iodine Swelling and Rash    Including Betadine     Medications: I have reviewed the patient's current medications.  The PMH, PSH, Medications, Allergies, and SH were reviewed and updated.  ROS: Constitutional: Negative for fever, weight loss and weight gain. Cardiovascular: Negative for chest pain and dyspnea on exertion. Respiratory: Is not experiencing shortness of breath at rest. Gastrointestinal: Negative for nausea and vomiting. Neurological: Negative for headaches. Psychiatric: The patient is not nervous/anxious  Blood pressure 135/77, pulse 77, height 5' 3.5" (1.613 m), weight 132 lb (59.9 kg), SpO2 97%. Body mass index is 23.02 kg/m.  PHYSICAL EXAM:  Exam: General: Well-developed, well-nourished Respiratory Respiratory effort: Equal inspiration and expiration without stridor Cardiovascular Peripheral Vascular: Warm extremities with equal color/perfusion Eyes: No nystagmus with equal extraocular motion bilaterally Neuro/Psych/Balance: Patient oriented to person, place, and time; Appropriate mood and affect; Gait is intact with no imbalance; Cranial nerves I-XII are intact Head and Face Inspection: Normocephalic and atraumatic without mass or lesion Palpation: Facial skeleton intact without bony stepoffs Salivary Glands: No mass or tenderness Facial Strength: Facial motility symmetric and full bilaterally ENT Pinna: External ear intact and fully developed External canal: Canal is patent with intact skin Tympanic Membrane: Clear and mobile External Nose: No scar or anatomic deformity Internal Nose: Septum is deviated to the left. No polyp, or purulence. Mucosal edema and erythema present.  Bilateral inferior turbinate hypertrophy.  Lips, Teeth, and gums: Mucosa and teeth intact and  viable TMJ: No pain to palpation with full mobility Oral cavity/oropharynx: No erythema or exudate, no lesions present Nasopharynx: No mass or lesion with intact mucosa Hypopharynx: Intact mucosa without pooling of secretions Larynx Glottic: Full true vocal cord mobility without lesion or mass Supraglottic: Normal appearing epiglottis and AE folds Interarytenoid Space: Moderate pachydermia&edema Subglottic Space: Patent without lesion or edema Neck Neck and Trachea: Midline trachea without mass or lesion Thyroid: No mass or nodularity Lymphatics: No lymphadenopathy  Procedure: Preoperative diagnosis: OSA CPAP intolerance moderate  Postoperative diagnosis:   Same + GERD LPR  Procedure: Flexible fiberoptic laryngoscopy  Surgeon: Ashok Croon, MD  Anesthesia: Topical lidocaine and Afrin Complications: None Condition is stable throughout exam  Indications and consent:  The patient presents to the clinic with above symptoms. Indirect laryngoscopy view was incomplete. Thus it was recommended that they undergo a flexible fiberoptic laryngoscopy. All of the risks, benefits, and potential complications were reviewed with the patient preoperatively and verbal informed consent was obtained.  Procedure: The patient was seated upright in the clinic. Topical lidocaine and Afrin were applied to the nasal cavity. After adequate anesthesia had occurred, I then proceeded to pass the flexible telescope into the nasal cavity. The nasal cavity was patent without rhinorrhea or polyp. The nasopharynx was also patent without mass or lesion. The base of tongue was visualized and was normal. There were  no signs of pooling of secretions in the piriform sinuses. The true vocal folds were mobile bilaterally. There were no signs of glottic or supraglottic mucosal lesion or mass. There was moderate interarytenoid pachydermia and post cricoid edema. The telescope was then slowly withdrawn and the patient tolerated  the procedure throughout.      Studies Reviewed: Home Sleep Study 12/19/2017  BMI 21.7 AHI 12.8 no mixed or central apneas   Home Sleep test 12/2022 [facility] - need to request report  Assessment/Plan: Encounter Diagnoses  Name Primary?   OSA (obstructive sleep apnea) Yes   Intolerance of continuous positive airway pressure (CPAP) ventilation     Assessment and Plan    Obstructive Sleep Apnea (OSA) OSA diagnosed in 2019. CPAP and oral appliance therapy were ineffective due to mask fit issues, side effects. Recent home sleep test (June 2024) indicated AHI of 37 per patient report. Need to request records of the most recent sleep study. No central or mixed events on the study done in 2019. BMI is not a concern for insurance approval (BMI of 23 today). Insomnia managed with trazodone and clonidine, but slept better when oral appliance was in place. Discussed Inspire implant: stimulates hypoglossal nerve to reduce obstruction. Procedure involves two incisions, device placement, and wire tunneling. Advised to avoid strenuous activities for one month post-procedure. Risks and benefits discussed and she would like to proceed.  OSA, moderate to severe, without multilevel collapse, with failure to tolerate PAP therapy and/or more conservative measures. Presence of smaller/absent tonsils and larger tongue position (Friedman tongue position or modified Mallampati) suggests that hypopharyngeal/retrolingual collapse is contributing to the patient's OSA. Zachery Conch, M et al. Staging of obstructive sleep apnea/hypopnea syndrome: a guide to appropriate treatment. Laryngoscope, 2004 Mar, 114(3):454-9. PMID: [id number]) Options including positional therapy, weight loss, oral appliances, PAP and surgical correction discussed. Pt is not ideal candidate for oral appliance due to severity of OSA on most recent sleep study. Pt could be a candidate for Hypoglossal nerve stimulation (Inspire therapy) pending DISE    - Request sleep study results from [facility] - Schedule drug-induced sleep endoscopy (DISE) to assess for complete concentric collapse - Proceed with insurance preapproval process for Inspire implant - Schedule Inspire implant procedure if DISE is favorable - Advised to avoid strenuous activities for one month post-procedure - Provide Inspire implant brochure  Insomnia Chronic difficulty falling and staying asleep. Managed with trazodone and clonidine. Insomnia may contribute to poor sleep quality but is not the primary issue given OSA diagnosis. - Continue trazodone and clonidine  Breast Cancer Right-sided lumpectomy with hematoma formation. No current issues. Known bleeder. Discussed potential for hematoma formation post-Inspire implant surgery. - Monitor for hematoma formation post-Inspire implant surgery  Chronic nasal congestion and Environmental Allergies  Allergy management with azelastine spray. - Continue azelastine spray - unable to tolerate antihistamines in the past, will hold off for now  Follow-up - Follow up with surgery schedulers to set up DISE    Thank you for allowing me to participate in the care of this patient. Please do not hesitate to contact me with any questions or concerns.   Ashok Croon, MD Otolaryngology [facility] Phone: [phone] Fax: [phone]    09/01/2023, 5:17 AM

## 2023-09-09 ENCOUNTER — Other Ambulatory Visit: Payer: Self-pay

## 2023-09-09 ENCOUNTER — Encounter (HOSPITAL_BASED_OUTPATIENT_CLINIC_OR_DEPARTMENT_OTHER): Payer: Self-pay

## 2023-09-14 ENCOUNTER — Ambulatory Visit: Payer: Medicare Other | Attending: Cardiology | Admitting: Cardiology

## 2023-09-14 ENCOUNTER — Encounter: Payer: Self-pay | Admitting: Cardiology

## 2023-09-14 VITALS — BP 126/84 | HR 79 | Ht 63.5 in | Wt 129.9 lb

## 2023-09-14 DIAGNOSIS — R931 Abnormal findings on diagnostic imaging of heart and coronary circulation: Secondary | ICD-10-CM | POA: Diagnosis present

## 2023-09-14 DIAGNOSIS — G4733 Obstructive sleep apnea (adult) (pediatric): Secondary | ICD-10-CM | POA: Diagnosis not present

## 2023-09-14 DIAGNOSIS — I2584 Coronary atherosclerosis due to calcified coronary lesion: Secondary | ICD-10-CM | POA: Diagnosis not present

## 2023-09-14 DIAGNOSIS — I2510 Atherosclerotic heart disease of native coronary artery without angina pectoris: Secondary | ICD-10-CM | POA: Insufficient documentation

## 2023-09-14 DIAGNOSIS — E7800 Pure hypercholesterolemia, unspecified: Secondary | ICD-10-CM | POA: Insufficient documentation

## 2023-09-14 NOTE — Patient Instructions (Signed)
 Medication Instructions:  Your physician recommends that you continue on your current medications as directed. Please refer to the Current Medication list given to you today.  *If you need a refill on your cardiac medications before your next appointment, please call your pharmacy*  Lab Work: None ordered today. If you have labs (blood work) drawn today and your tests are completely normal, you will receive your results only by: MyChart Message (if you have MyChart) OR A paper copy in the mail If you have any lab test that is abnormal or we need to change your treatment, we will call you to review the results.  Testing/Procedures: None ordered today.  Follow-Up: At [facility], you and your health needs are our priority.  As part of our continuing mission to provide you with exceptional heart care, we have created designated Provider Care Teams.  These Care Teams include your primary Cardiologist (physician) and Advanced Practice Providers (APPs -  Physician Assistants and Nurse Practitioners) who all work together to provide you with the care you need, when you need it.  We recommend signing up for the patient portal called "MyChart".  Sign up information is provided on this After Visit Summary.  MyChart is used to connect with patients for Virtual Visits (Telemedicine).  Patients are able to view lab/test results, encounter notes, upcoming appointments, etc.  Non-urgent messages can be sent to your provider as well.   To learn more about what you can do with MyChart, go to [email or web address].    Your next appointment:   As needed  The format for your next appointment:   In Person  Provider:   Tessa Lerner, DO {

## 2023-09-14 NOTE — Progress Notes (Signed)
 Cardiology Office Note:  .   Date:  09/14/2023  ID:  Candace Campbell, DOB September 28, 1955, MRN [id number] PCP:  Shon Hale, MD  Former Cardiology Providers: N/A [facility] Providers Cardiologist:  Tessa Lerner, DO , [facility] (established care November 2022) Electrophysiologist:  None  Click to update primary MD,subspecialty MD or APP then REFRESH:1}    Chief Complaint  Patient presents with   Coronary atherosclerosis due to calcified coronary lesion   Follow-up    1 year    History of Present Illness: .   Candace Campbell is a 68 y.o. Caucasian female whose past medical history and cardiovascular risk factors includes: History of right-sided breast cancer treated with lumpectomy/radiation, OSA w/ dental appliance, hyperlipidemia, severe coronary artery calcification, postmenopausal female.   In the past patient was referred to practice by PCP for severe coronary artery calcification management.  Patient has undergone appropriate cardiovascular/ischemia workup in the past as mentioned below.  She presents today for 1 year follow-up visit.  Over the last 1 year patient states that she is doing well from a cardiovascular standpoint.  He denies anginal chest pain or heart failure symptoms.  Overall functional capacity remains stable.  She enjoys walking her dog and going to the gym daily.  She participates in Pilates, barre, significant exercise classes and feels great.  Patient also informs me that based on the most recent lipid profile from December 2024 her PCP has increased her atorvastatin from 10 mg to 20 mg p.o. daily.  No repeat lab work available for review after making the change.  Patient informs me that she will be undergoing possible implant of inspire device given her sleep apnea.   Review of Systems: .   Review of Systems  Cardiovascular:  Negative for chest pain, claudication, irregular heartbeat, leg swelling, near-syncope, orthopnea, palpitations, paroxysmal nocturnal  dyspnea and syncope.  Respiratory:  Negative for shortness of breath.   Hematologic/Lymphatic: Negative for bleeding problem.    Studies Reviewed:   EKG: EKG Interpretation Date/Time:  Wednesday September 14 2023 15:05:39 EDT Ventricular Rate:  79 PR Interval:  126 QRS Duration:  76 QT Interval:  382 QTC Calculation: 438 R Axis:   -22  Text Interpretation: Normal sinus rhythm Nonspecific ST and T wave abnormality When compared with ECG of 02-Mar-2007 13:01, Non-specific change in ST segment in Anterior leads T wave inversion now evident in Anterior leads Confirmed by Tessa Lerner [phone]) on 09/14/2023 3:33:19 PM  Echocardiogram: 05/26/2021: LVEF 55 to 60%, normal diastolic function, no significant valvular heart disease.  Stress Testing: Exercise nuclear stress test November 2022: Low risk study.   CT Cardiac Scoring:  04/21/2021 Left main: 0. LAD 390. LCx: 30. RCA 0 Total CAC 420AU, 95 th percentile for patient's age, sex, and race.  RADIOLOGY: NA  Risk Assessment/Calculations:   NA  Labs:    External Labs: Collected: 04/06/2022 Hemoglobin 12.8 g/dL, hematocrit 30.8%. BUN 14, creatinine 0.89. Sodium 140, potassium 4.3, chloride 101, bicarb 34. AST 15, alkaline phosphatase 46, ALT 14. Total cholesterol 160, triglycerides 54, HDL 70, LDL 79, non-HDL 90  External Labs: Collected: 06/07/2023 [facility] database. Total cholesterol 203, triglycerides 47, HDL 92, LDL 102  Physical Exam:    Today's Vitals   09/14/23 1501  BP: 126/84  Pulse: 79  SpO2: 96%  Weight: 129 lb 14.4 oz (58.9 kg)  Height: 5' 3.5" (1.613 m)   Body mass index is 22.65 kg/m. Wt Readings from Last 3 Encounters:  09/14/23 129 lb  14.4 oz (58.9 kg)  08/31/23 132 lb (59.9 kg)  10/04/22 127 lb (57.6 kg)    Physical Exam  Constitutional: No distress.  hemodynamically stable  Neck: No JVD present.  Cardiovascular: Normal rate, regular rhythm, S1 normal and S2 normal. Exam reveals no gallop, no S3 and  no S4.  No murmur heard. Pulmonary/Chest: Effort normal and breath sounds normal. No stridor. She has no wheezes. She has no rales.  Musculoskeletal:        General: No edema.     Cervical back: Neck supple.  Skin: Skin is warm.   Impression & Recommendation(s):  Impression:   ICD-10-CM   1. Coronary atherosclerosis due to calcified coronary lesion  I25.10 EKG 12-Lead   I25.84     2. Agatston coronary artery calcium score greater than 400  R93.1     3. Pure hypercholesterolemia  E78.00     4. OSA (obstructive sleep apnea)  G47.33        Recommendation(s):  Coronary atherosclerosis due to calcified coronary lesion Agatston coronary artery calcium score greater than 400 Coronary calcium score October 2022: 420 AU, 95th percentile Continue aspirin 81 mg p.o. daily. Agree with the up titration of Lipitor to 20 mg p.o. daily.  Recommend having follow-up labs including lipids with PCP to reevaluate therapy. Overall functional capacity is excellent for her age. No chest pain or anginal discomfort since last office visit EKG illustrates sinus rhythm with subtle changes.  Since the patient is asymptomatic and she has had relatively stable cardiovascular workup in the past no additional testing is warranted at this time.  Patient agreeable with the plan of care. Patient is considered to be acceptable candidate for her upcoming procedure for the implant of inspire device from a cardiovascular standpoint  Pure hypercholesterolemia Currently on atorvastatin 20 mg p.o. daily.   She denies myalgia or other side effects. Most recent lipids dated 06/07/2023, independently reviewed as noted above.  LDL is 102 mg/dL.  Cardiology following peripherally, managed by primary care provider.  OSA (obstructive sleep apnea) In the past was compliant with her dental appliance. Currently being worked up for inspire  Discussed management of at least 2 chronic comorbid conditions, EKG ordered and  independently reviewed, prior echocardiogram and stress test results reviewed, medical decision making, discussed the need for uptitration of pharmacological therapy, discussed preprocedure risk assessment, and coordination of care.  Orders Placed:  Orders Placed This Encounter  Procedures   EKG 12-Lead    Final Medication List:   No orders of the defined types were placed in this encounter.   Medications Discontinued During This Encounter  Medication Reason   ALPRAZolam (XANAX) 0.25 MG tablet Completed Course     Current Outpatient Medications:    aspirin EC 81 MG tablet, Take 1 tablet (81 mg total) by mouth daily. Swallow whole., Disp: 30 tablet, Rfl: 11   atorvastatin (LIPITOR) 20 MG tablet, Take 20 mg by mouth daily., Disp: , Rfl:    azelastine (ASTELIN) 0.1 % nasal spray, U 1 SPR IEN BID, Disp: , Rfl:    busPIRone (BUSPAR) 15 MG tablet, TK 1 AND 1/2 T PO AM AND TK 2 TS QHS FOR ANXIETY PRN. MAY TAKE NOON DOSE IF NEEDED FOR SEVERE ANXIETY, Disp: , Rfl:    Calcium-Magnesium-Vitamin D (CALCIUM 500 PO), Take 1 tablet by mouth daily., Disp: , Rfl:    Denosumab (PROLIA [place]), Inject into the skin every 6 (six) months., Disp: , Rfl:    DULoxetine (CYMBALTA) 60  MG capsule, Take 60 mg by mouth daily., Disp: , Rfl:    lamoTRIgine (LAMICTAL) 200 MG tablet, Take 200 mg by mouth at bedtime. , Disp: , Rfl:    Multiple Vitamin (MULTIVITAMIN WITH MINERALS) TABS tablet, Take 1 tablet by mouth daily., Disp: , Rfl:    Omega-3 Fatty Acids (FISH OIL PO), Take by mouth., Disp: , Rfl:    polyethylene glycol (MIRALAX / GLYCOLAX) packet, Take 17 g by mouth daily as needed., Disp: , Rfl:    TRAZODONE HCL PO, Take 50 mg by mouth at bedtime as needed. Takes every night, Disp: , Rfl:    Vitamin D, Cholecalciferol, 1000 units CAPS, Take 1 tablet by mouth daily., Disp: , Rfl:    Zinc 50 MG TABS, Take by mouth., Disp: , Rfl:    CLONIDINE ER PO, Take by mouth., Disp: , Rfl:   Consent:    NA  Disposition:   As  needed basis.  Will defer her back to her PCP for management of her chronic comorbid conditions.  She is more than welcome to reach out to cardiology if there is change in clinical status or questions or concerns arise.  Patient is agreeable with the plan of care.  Her questions and concerns were addressed to her satisfaction. She voices understanding of the recommendations provided during this encounter.    Signed, Tessa Lerner, DO, [facility]  [facility]  [address] [place], [place] [address] 09/14/2023 6:43 PM

## 2023-09-16 ENCOUNTER — Ambulatory Visit (HOSPITAL_BASED_OUTPATIENT_CLINIC_OR_DEPARTMENT_OTHER): Admitting: Certified Registered Nurse Anesthetist

## 2023-09-16 ENCOUNTER — Other Ambulatory Visit (INDEPENDENT_AMBULATORY_CARE_PROVIDER_SITE_OTHER): Payer: Self-pay | Admitting: Otolaryngology

## 2023-09-16 ENCOUNTER — Other Ambulatory Visit: Payer: Self-pay

## 2023-09-16 ENCOUNTER — Encounter (HOSPITAL_BASED_OUTPATIENT_CLINIC_OR_DEPARTMENT_OTHER)
Admission: RE | Disposition: A | Discharge status: Home / Self Care | Payer: Self-pay | Source: Home / Self Care | Attending: Otolaryngology

## 2023-09-16 ENCOUNTER — Encounter (HOSPITAL_BASED_OUTPATIENT_CLINIC_OR_DEPARTMENT_OTHER): Payer: Self-pay

## 2023-09-16 ENCOUNTER — Ambulatory Visit (HOSPITAL_BASED_OUTPATIENT_CLINIC_OR_DEPARTMENT_OTHER)
Admission: RE | Admit: 2023-09-16 | Discharge: 2023-09-16 | Disposition: A | Discharge status: Home / Self Care | Payer: Medicare Other | Attending: Otolaryngology | Admitting: Otolaryngology

## 2023-09-16 DIAGNOSIS — I2510 Atherosclerotic heart disease of native coronary artery without angina pectoris: Secondary | ICD-10-CM | POA: Insufficient documentation

## 2023-09-16 DIAGNOSIS — F418 Other specified anxiety disorders: Secondary | ICD-10-CM

## 2023-09-16 DIAGNOSIS — Z01818 Encounter for other preprocedural examination: Secondary | ICD-10-CM

## 2023-09-16 DIAGNOSIS — Z8249 Family history of ischemic heart disease and other diseases of the circulatory system: Secondary | ICD-10-CM | POA: Insufficient documentation

## 2023-09-16 DIAGNOSIS — K219 Gastro-esophageal reflux disease without esophagitis: Secondary | ICD-10-CM | POA: Diagnosis not present

## 2023-09-16 DIAGNOSIS — Z79899 Other long term (current) drug therapy: Secondary | ICD-10-CM | POA: Diagnosis not present

## 2023-09-16 DIAGNOSIS — Z853 Personal history of malignant neoplasm of breast: Secondary | ICD-10-CM | POA: Diagnosis not present

## 2023-09-16 DIAGNOSIS — G4700 Insomnia, unspecified: Secondary | ICD-10-CM | POA: Insufficient documentation

## 2023-09-16 DIAGNOSIS — Z789 Other specified health status: Secondary | ICD-10-CM

## 2023-09-16 DIAGNOSIS — G4733 Obstructive sleep apnea (adult) (pediatric): Secondary | ICD-10-CM

## 2023-09-16 HISTORY — PX: DRUG INDUCED ENDOSCOPY: SHX6808

## 2023-09-16 SURGERY — DRUG INDUCED SLEEP ENDOSCOPY
Anesthesia: Monitor Anesthesia Care | Site: Nose

## 2023-09-16 MED ORDER — OXYMETAZOLINE HCL 0.05 % NA SOLN
NASAL | Status: AC
  Filled 2023-09-16: qty 30

## 2023-09-16 MED ORDER — PROPOFOL 10 MG/ML IV BOLUS
INTRAVENOUS | Status: DC | PRN
  Administered 2023-09-16: 20 mg via INTRAVENOUS
  Administered 2023-09-16: 10 mg via INTRAVENOUS

## 2023-09-16 MED ORDER — PROPOFOL 500 MG/50ML IV EMUL
INTRAVENOUS | Status: DC | PRN
Start: 2023-09-16 — End: 2023-09-16
  Administered 2023-09-16: 100 ug/kg/min via INTRAVENOUS

## 2023-09-16 MED ORDER — ACETAMINOPHEN 500 MG PO TABS
ORAL_TABLET | ORAL | Status: AC
Start: 2023-09-16 — End: ?
  Filled 2023-09-16: qty 2

## 2023-09-16 MED ORDER — ACETAMINOPHEN 500 MG PO TABS
1000.0000 mg | ORAL_TABLET | Freq: Once | ORAL | Status: AC
  Administered 2023-09-16: 1000 mg via ORAL

## 2023-09-16 MED ORDER — ONDANSETRON HCL 4 MG/2ML IJ SOLN
INTRAMUSCULAR | Status: DC | PRN
  Administered 2023-09-16: 4 mg via INTRAVENOUS

## 2023-09-16 MED ORDER — LIDOCAINE-EPINEPHRINE 1 %-1:100000 IJ SOLN
INTRAMUSCULAR | Status: AC
Start: 2023-09-16 — End: ?
  Filled 2023-09-16: qty 1

## 2023-09-16 MED ORDER — LIDOCAINE-EPINEPHRINE 1 %-1:100000 IJ SOLN
INTRAMUSCULAR | Status: DC | PRN
  Administered 2023-09-16: 6 mL

## 2023-09-16 MED ORDER — LACTATED RINGERS IV SOLN: INTRAVENOUS | Status: DC

## 2023-09-16 MED ORDER — OXYMETAZOLINE HCL 0.05 % NA SOLN
NASAL | Status: DC | PRN
  Administered 2023-09-16: 1 via TOPICAL

## 2023-09-16 SURGICAL SUPPLY — 16 items
ANTIFOG SOL W/FOAM PAD STRL (MISCELLANEOUS) IMPLANT
BRONCHOSCOPE PED SLIM DISP (MISCELLANEOUS) ×1 IMPLANT
CANISTER SUCT 1200ML W/VALVE (MISCELLANEOUS) IMPLANT
GLOVE BIO SURGEON STRL SZ 6 (GLOVE) IMPLANT
GLOVE BIOGEL PI IND STRL 7.5 (GLOVE) IMPLANT
GLOVE SURG SS PI 6.0 STRL IVOR (GLOVE) IMPLANT
KIT CLEAN ENDO (MISCELLANEOUS) IMPLANT
NDL HYPO 18GX1.5 BLUNT FILL (NEEDLE) ×2 IMPLANT
NEEDLE HYPO 18GX1.5 BLUNT FILL (NEEDLE) ×1 IMPLANT
PATTIES SURGICAL .5 X3 (DISPOSABLE) ×1 IMPLANT
SHEET MEDIUM DRAPE 40X70 STRL (DRAPES) IMPLANT
SOLUTION ANTFG W/FOAM PAD STRL (MISCELLANEOUS) ×1 IMPLANT
SURGILUBE 2OZ TUBE FLIPTOP (MISCELLANEOUS) ×1 IMPLANT
SYR 10ML LL (SYRINGE) ×2 IMPLANT
TOWEL GREEN STERILE FF (TOWEL DISPOSABLE) ×2 IMPLANT
TUBE CONNECTING 20X1/4 (TUBING) IMPLANT

## 2023-09-16 NOTE — Progress Notes (Signed)
 Candidate for Inspire based on DISE - order to schedule Inspire Implant

## 2023-09-16 NOTE — Discharge Instructions (Addendum)
 DRUG-INDUCED SLEEP ENDOSCOPY POST-OPERATIVE INSTRUCTIONS:  Based on the drug-induced sleep endoscopy today, you were deemed a candidate for Inspire Therapy.  Please review post-operative and recovery instructions below that you will need to be aware of after Inspire Implant surgery.  Please restart all of your home medications if you take anything on a daily basis.  You can resume regular diet after this procedure.  You will be scheduled for pre-operative appointment with Dr. Irene Pap to review details about surgery and to discuss the next steps.   DIET: Resume normal diet HYGIENE: Please wait until 48 hours after surgery before getting incisions on neck, chest, and torso wet. In the first 48 hours after surgery, will likely need to take sponge baths. WOUND CARE: Please leave pressure dressing on for 48 hours after surgery. Gently place antibiotic ointment over incisions 2 times per day; use clean q-tip. May place a clean bandage over incisions as needed. After 48 hours, you may get incisions wet with warm soap and water, but do not soak the incisions.  Pat area dry gently.  Immediately place antibiotic ointment. Take oral antibiotics as prescribed If skin around incision starts to get red (> 1cm), swollen, and/or more painful, please call the office ACTIVITY: Try to avoid sleeping on the side of your surgery, to the extent possible.   You may walk for exercise starting the day after surgery. For 2 weeks: Do not pick up anything greater than 5 pounds with the hand/arm that's on the same side as the surgery.  After 2 weeks, you may increase weight to 10 pounds.   Consider performing neck rolls 10 clockwise and 10 counterclockwise 3x/day. For 4 weeks, no strenuous activity (running, jogging, lifting weights, gardening, sports) or until cleared by physician.   PAIN MEDICATIONS: You will be prescribed xxx for pain.   If pain is not severe, consider taking Tylenol 650mg  every 6 hours Avoid  aspirin for 7 days after surgery Avoid direct heat (such as heating pads) to incision sites.   May gently place ice over surgery sites as needed.  Please place a thin clean towel over skin first and then place ice bag over towel.  Ice for 10 minutes at a time only.  POST-OPERATIVE CLINIC APPOINTMENTS: 1 week: suture removal and wound check in the office.  1 month: device activation and wound check in the office. 2.5 months: check in visit to assess usage. 3-4 months: titration sleep study based on usage of >4 hrs/night.  4 months: final wound check in the office.  Yearly: device check at office.  SCAR CARE: After incisions have healed, you will have a scar, which will continue to evolve over the course of 12 months.  Caring for your incision scars will help them to be as minimal as possible. If you are out in the sun with incision exposure, please remember to place sunscreen over the incision and surrounding skin.   You may use vitamin E or "Scar ointment/cream" to help soften scar.  Please wait one month after surgery before starting this.    Post Anesthesia Home Care Instructions  Activity: Get plenty of rest for the remainder of the day. A responsible individual must stay with you for 24 hours following the procedure.  For the next 24 hours, DO NOT: -Drive a car -Advertising copywriter -Drink alcoholic beverages -Take any medication unless instructed by your physician -Make any legal decisions or sign important papers.  Meals: Start with liquid foods such as gelatin or soup. Progress  to regular foods as tolerated. Avoid greasy, spicy, heavy foods. If nausea and/or vomiting occur, drink only clear liquids until the nausea and/or vomiting subsides. Call your physician if vomiting continues.  Special Instructions/Symptoms: Your throat may feel dry or sore from the anesthesia or the breathing tube placed in your throat during surgery. If this causes discomfort, gargle with warm salt water.  The discomfort should disappear within 24 hours.  If you had a scopolamine patch placed behind your ear for the management of post- operative nausea and/or vomiting:  1. The medication in the patch is effective for 72 hours, after which it should be removed.  Wrap patch in a tissue and discard in the trash. Wash hands thoroughly with soap and water. 2. You may remove the patch earlier than 72 hours if you experience unpleasant side effects which may include dry mouth, dizziness or visual disturbances. 3. Avoid touching the patch. Wash your hands with soap and water after contact with the patch.    No tylenol until 6 pm

## 2023-09-16 NOTE — Anesthesia Preprocedure Evaluation (Addendum)
 Anesthesia Evaluation  Patient identified by MRN, date of birth, ID band Patient awake    Reviewed: Allergy & Precautions, NPO status , Patient's Chart, lab work & pertinent test results  History of Anesthesia Complications (+) PONV  Airway Mallampati: II  TM Distance: >3 FB Neck ROM: Full    Dental  (+) Teeth Intact, Dental Advisory Given   Pulmonary sleep apnea (uses dental appliance)    breath sounds clear to auscultation       Cardiovascular (-) angina + CAD (coronary calcification)   Rhythm:Regular Rate:Normal  '22 ECHO: EF 55-60%. LV is normal in size. Normal left ventricular wall thickness. Normal global wall motion. Normal diastolic filling pattern, normal LAP.  Mild TR. No evidence of pulmonary hypertension.   '22 stress: Myocardial perfusion is normal. Overall LV systolic function is normal without regional wall motion abnormalities. Stress LV EF: 45%. Visually appears normal.  Low risk.      Neuro/Psych  Headaches  Anxiety Depression       GI/Hepatic Neg liver ROS,GERD  Medicated and Controlled,,  Endo/Other  negative endocrine ROS    Renal/GU negative Renal ROS     Musculoskeletal   Abdominal   Peds  Hematology negative hematology ROS (+)   Anesthesia Other Findings H/o breast cancer  Reproductive/Obstetrics                             Anesthesia Physical Anesthesia Plan  ASA: 3  Anesthesia Plan: MAC   Post-op Pain Management: Tylenol PO (pre-op)*   Induction:   PONV Risk Score and Plan: 3 and Ondansetron, Dexamethasone and Treatment may vary due to age or medical condition  Airway Management Planned: Natural Airway  Additional Equipment: None  Intra-op Plan:   Post-operative Plan:   Informed Consent: I have reviewed the patients History and Physical, chart, labs and discussed the procedure including the risks, benefits and alternatives for the proposed  anesthesia with the patient or authorized representative who has indicated his/her understanding and acceptance.     Dental advisory given  Plan Discussed with: CRNA and Surgeon  Anesthesia Plan Comments:         Anesthesia Quick Evaluation

## 2023-09-16 NOTE — Op Note (Addendum)
 Operative note  Preoperative diagnosis: OSA Postoperative diagnosis:   Same  Procedure:DISE ( Drug induced sleep endoscopy)  Anesthesia: Topical lidocaine gel Complications: None Condition is stable throughout exam  Indications and consent:   The patient presents to my otolaryngology clinic with a chief complaint of OSA  Because of pt's OSA and desire to be a candidate for Inspire therapy, it was recommended that they undergo a flexible fiberoptic laryngoscopy under sedation (DISE).   All the risks, benefits, and potential complications were reviewed with the patient preoperatively and informed consent was obtained.  Procedure: Pt was brought back to the OR and laid supine on the table. Propofol anesthesia was administered per protocol until pt reached optimal level of sedation. DISE exam showed the following anatomic collapse pattern.  VOTE classification Velopharynx- 1 A-P Oropharynx- 1 Tongue base- 2 Epiglottis- 1   Based on the DISE findings, pt is a candidate for Hypoglossal nerve stimulation (Inspire therapy) based on the above anatomy and no evidence of any concentric collapse and the soft palate.

## 2023-09-16 NOTE — Interval H&P Note (Signed)
 History and Physical Interval Note:  09/16/2023 12:21 PM  Candace Campbell  has presented today for surgery, with the diagnosis of OBSTRUCTIVE SLEEP APNEA.  The various methods of treatment have been discussed with the patient and family. After consideration of risks, benefits and other options for treatment, the patient has consented to  Procedure(s): DRUG INDUCED SLEEP ENDOSCOPY (N/A) as a surgical intervention.  The patient's history has been reviewed, patient examined, no change in status, stable for surgery.  I have reviewed the patient's chart and labs.  Questions were answered to the patient's satisfaction.     Ashok Croon

## 2023-09-16 NOTE — Transfer of Care (Signed)
 Immediate Anesthesia Transfer of Care Note  Patient: Candace Campbell  Procedure(s) Performed: DRUG INDUCED SLEEP ENDOSCOPY (Nose)  Patient Location: PACU  Anesthesia Type:MAC  Level of Consciousness: awake, alert , and oriented  Airway & Oxygen Therapy: Patient Spontanous Breathing  Post-op Assessment: Report given to RN and Post -op Vital signs reviewed and stable  Post vital signs: Reviewed and stable  Last Vitals:  Vitals Value Taken Time  BP 152/86 09/16/23 1251  Temp    Pulse 72 09/16/23 1252  Resp    SpO2 96 % 09/16/23 1252  Vitals shown include unfiled device data.  Last Pain:  Vitals:   09/16/23 1156  TempSrc: Temporal  PainSc: 0-No pain         Complications: No notable events documented.

## 2023-09-16 NOTE — Anesthesia Postprocedure Evaluation (Signed)
 Anesthesia Post Note  Patient: Candace Campbell  Procedure(s) Performed: DRUG INDUCED SLEEP ENDOSCOPY (Nose)     Patient location during evaluation: PACU Anesthesia Type: MAC Level of consciousness: awake and alert Pain management: pain level controlled Vital Signs Assessment: post-procedure vital signs reviewed and stable Respiratory status: spontaneous breathing, nonlabored ventilation and respiratory function stable Cardiovascular status: stable and blood pressure returned to baseline Postop Assessment: no apparent nausea or vomiting Anesthetic complications: no  No notable events documented.  Last Vitals:  Vitals:   09/16/23 1255 09/16/23 1300  BP: (!) 140/92 (!) 154/80  Pulse: 70 68  Resp:  20  Temp:  (!) 36.1 C  SpO2: 95% 96%    Last Pain:  Vitals:   09/16/23 1300  TempSrc:   PainSc: 0-No pain                 Kyla Duffy,W. EDMOND

## 2023-09-17 ENCOUNTER — Encounter (HOSPITAL_BASED_OUTPATIENT_CLINIC_OR_DEPARTMENT_OTHER): Payer: Self-pay | Admitting: Otolaryngology

## 2023-10-11 ENCOUNTER — Other Ambulatory Visit: Payer: Self-pay

## 2023-10-11 ENCOUNTER — Encounter (HOSPITAL_BASED_OUTPATIENT_CLINIC_OR_DEPARTMENT_OTHER): Payer: Self-pay | Admitting: *Deleted

## 2023-10-12 ENCOUNTER — Ambulatory Visit (INDEPENDENT_AMBULATORY_CARE_PROVIDER_SITE_OTHER): Admitting: Otolaryngology

## 2023-10-12 ENCOUNTER — Encounter (INDEPENDENT_AMBULATORY_CARE_PROVIDER_SITE_OTHER): Payer: Self-pay | Admitting: Otolaryngology

## 2023-10-12 VITALS — BP 154/83 | HR 77 | Wt 127.0 lb

## 2023-10-12 DIAGNOSIS — Z91198 Patient's noncompliance with other medical treatment and regimen for other reason: Secondary | ICD-10-CM

## 2023-10-12 DIAGNOSIS — G4733 Obstructive sleep apnea (adult) (pediatric): Secondary | ICD-10-CM | POA: Diagnosis not present

## 2023-10-12 DIAGNOSIS — K219 Gastro-esophageal reflux disease without esophagitis: Secondary | ICD-10-CM | POA: Diagnosis not present

## 2023-10-12 DIAGNOSIS — Z789 Other specified health status: Secondary | ICD-10-CM

## 2023-10-12 MED ORDER — IBUPROFEN 600 MG PO TABS: 600.0000 mg | ORAL_TABLET | Freq: Four times a day (QID) | ORAL | 0 refills | Status: DC

## 2023-10-12 MED ORDER — AMOXICILLIN-POT CLAVULANATE 875-125 MG PO TABS: 1.0000 | ORAL_TABLET | Freq: Two times a day (BID) | ORAL | 0 refills | Status: DC

## 2023-10-12 MED ORDER — ACETAMINOPHEN 500 MG PO TABS: 500.0000 mg | ORAL_TABLET | Freq: Four times a day (QID) | ORAL | 1 refills | Status: DC | PRN

## 2023-10-12 MED ORDER — OXYCODONE HCL 5 MG PO TABS: 5.0000 mg | ORAL_TABLET | Freq: Four times a day (QID) | ORAL | 0 refills | Status: DC | PRN

## 2023-10-12 NOTE — Patient Instructions (Signed)

## 2023-10-12 NOTE — Progress Notes (Unsigned)
 ENT Progress Note:   Update 10/12/2023  Discussed the use of AI scribe software for clinical note transcription with the patient, who gave verbal consent to proceed.  History of Present Illness Candace Campbell is a 68 year old female who presents for pre-operative counseling for an upcoming surgical procedure (Inspire).   She is concerned about potential bleeding during the surgery, referencing past experiences with hematomas following a lumpectomy/breast surgery and other surgeries. She has stopped taking aspirin and fish oil to mitigate bleeding risks.  She has a history of significant bleeding following a lumpectomy and uterine surgery, where she experienced a hematoma and perforation, respectively. Despite these events, she has not been diagnosed with any hematologic disorders, and previous lab work did not indicate any bleeding disorders.   She has a history of gastroesophageal reflux disease (GERD) and acid reflux, for which she takes PPI in the morning and famotidine at night. She also uses Tums as needed. She is trying to reduce her medication intake and manage her condition through dietary adjustments.  She has a known allergy to clindamycin and has been prescribed   Records Reviewed:  Initial Evaluation  Reason for Consult: OSA CPAP intolerance    Ref: Dr Dwyane Luo Sleep   HPI: Discussed the use of AI scribe software for clinical note transcription with the patient, who gave verbal consent to proceed.  History of Present Illness   Candace Campbell is a 68 year old female with obstructive sleep apnea who presents with interest in alternatives to CPAP therapy. She was referred by Dr. Ermalinda Memos for [facility] implant evaluation.  She has a history of obstructive sleep apnea, initially diagnosed in 2019, characterized by persistent snoring. Initially, she used a mouthpiece, which became unaffordable to replace after wearing out. She attempted CPAP therapy but faced difficulties with mask  fitting, resulting in discomfort and bloating from air intake. Despite trying various masks, she was unable to find a suitable fit, experiencing significant discomfort, including bruising and bloating, which prevented effective use of the CPAP.  In June 2024, she underwent a home sleep study, which included one night with an oral appliance and one night without and reports AHI of ~ 37 (we don't have records for review). This was prompted by memory problems and a recommendation following dementia testing, which she does not currently have but has a genetic predisposition for. The study was suggested to assess if her sleep quality was contributing to her memory issues.  She has a history of insomnia and takes trazodone, though she has reduced her dose since starting clonidine. Without medication, she has difficulty falling and staying asleep, often waking up hourly. Her sleep quality was better when using the oral appliance previously.  Her BMI was 21 in 2019, and she weighed 130 pounds at that time. She has experienced some weight fluctuation, currently weighing 132 pounds after gaining weight over Christmas, though she was 126 pounds for a period. No history of stroke or heart attack. No history of being on blood thinners.       Past Medical History:  Diagnosis Date   Anxiety    Blood dyscrasia    states daughter has Von Willebrand and she was tested 03-19-15 by Dr Pamelia Hoit   Breast cancer [facility]) 03/13/2015   right breast    Coronary atherosclerosis due to calcified coronary lesion    Depression    GERD (gastroesophageal reflux disease)    Headache    HX MIGRAINES   History of kidney  stones    Hyperlipidemia    OSA (obstructive sleep apnea)    Osteopenia    per notes from [facility]    Personal history of radiation therapy    PONV (postoperative nausea and vomiting)     Past Surgical History:  Procedure Laterality Date   ABDOMINAL HYSTERECTOMY     BREAST BIOPSY  Right 2016   BREAST LUMPECTOMY Right 2016   BREAST LUMPECTOMY WITH RADIOACTIVE SEED AND SENTINEL LYMPH NODE BIOPSY Right 03/26/2015   Procedure: BREAST LUMPECTOMY WITH RADIOACTIVE SEED AND SENTINEL LYMPH NODE BIOPSY;  Surgeon: Almond Lint, MD;  Location: [facility];  Service: General;  Laterality: Right;   BUNIONECTOMY     BILAT   DRUG INDUCED ENDOSCOPY N/A 09/16/2023   Procedure: DRUG INDUCED SLEEP ENDOSCOPY;  Surgeon: Ashok Croon, MD;  Location: [facility];  Service: ENT;  Laterality: N/A;   LUMBAR LAMINECTOMY/DECOMPRESSION MICRODISCECTOMY Right 03/03/2016   Procedure: RIGHT LUMBAR FOUR-FIVE LUMBAR LAMINECTOMYAND MICRODISCECTOMY;  Surgeon: Shirlean Kelly, MD;  Location: [facility];  Service: Neurosurgery;  Laterality: Right;  right    Family History  Problem Relation Age of Onset   Dementia Mother    Heart disease Father    Heart attack Father    Hyperlipidemia Sister    Breast cancer Maternal Grandmother 24    Social History:  reports that she has never smoked. She has never used smokeless tobacco. She reports current alcohol use of about 1.0 standard drink of alcohol per week. She reports that she does not use drugs.  Allergies:  Allergies  Allergen Reactions   Latex Swelling   Clindamycin/Lincomycin Hives   Peanut-Containing Drug Products Other (See Comments)    Headache/migraine   Sulfa Antibiotics Other (See Comments)    UNSPECIFIED REACTION  Pt does not remember the symptoms "They told me I should not take it anymore".   Zolpidem Other (See Comments)    Other Reaction(s): nightmares, extreme sedation   Iodine Swelling and Rash    Including Betadine     Medications: I have reviewed the patient's current medications.  The PMH, PSH, Medications, Allergies, and SH were reviewed and updated.  ROS: Constitutional: Negative for fever, weight loss and weight gain. Cardiovascular: Negative for chest pain and dyspnea on  exertion. Respiratory: Is not experiencing shortness of breath at rest. Gastrointestinal: Negative for nausea and vomiting. Neurological: Negative for headaches. Psychiatric: The patient is not nervous/anxious  Blood pressure (!) 154/83, pulse 77, weight 127 lb (57.6 kg), SpO2 97%. Body mass index is 22.14 kg/m.  PHYSICAL EXAM:  Exam: General: Well-developed, well-nourished Respiratory Respiratory effort: Equal inspiration and expiration without stridor Cardiovascular Peripheral Vascular: Warm extremities with equal color/perfusion  Cranial nerves I-XII are intact Head and Face Inspection: Normocephalic and atraumatic without mass or lesion Facial Strength: Facial motility symmetric and full bilaterally ENT Pinna: External ear intact and fully developed External canal: Canal is patent with intact skin Tympanic Membrane: Clear and mobile External Nose: No scar or anatomic deformity Lips, Teeth, and gums: Mucosa and teeth intact and viable Neck Neck and Trachea: Midline trachea without mass or lesion Thyroid: No mass or nodularity Lymphatics: No lymphadenopathy  Studies Reviewed: Home Sleep Study 12/19/2017  BMI 21.7 AHI 12.8 no mixed or central apneas   Home Sleep test 12/2022 [facility] - need to request report  Assessment/Plan: Encounter Diagnoses  Name Primary?   OSA (obstructive sleep apnea) Yes   Intolerance of continuous positive airway pressure (CPAP) ventilation  Chronic GERD      Assessment and Plan    Obstructive Sleep Apnea (OSA) OSA diagnosed in 2019. CPAP and oral appliance therapy were ineffective due to mask fit issues, side effects. Recent home sleep test (June 2024) indicated AHI of 37 per patient report. Need to request records of the most recent sleep study. No central or mixed events on the study done in 2019. BMI is not a concern for insurance approval (BMI of 23 today). Insomnia managed with trazodone and clonidine, but slept better when oral  appliance was in place. Discussed Inspire implant: stimulates hypoglossal nerve to reduce obstruction. Procedure involves two incisions, device placement, and wire tunneling. Advised to avoid strenuous activities for one month post-procedure. Risks and benefits discussed and she would like to proceed.  OSA, moderate to severe, without multilevel collapse, with failure to tolerate PAP therapy and/or more conservative measures. Presence of smaller/absent tonsils and larger tongue position (Friedman tongue position or modified Mallampati) suggests that hypopharyngeal/retrolingual collapse is contributing to the patient's OSA. Zachery Conch, M et al. Staging of obstructive sleep apnea/hypopnea syndrome: a guide to appropriate treatment. Laryngoscope, 2004 Mar, 114(3):454-9. PMID: [id number]) Options including positional therapy, weight loss, oral appliances, PAP and surgical correction discussed. Pt is not ideal candidate for oral appliance due to severity of OSA on most recent sleep study. Pt could be a candidate for Hypoglossal nerve stimulation (Inspire therapy) pending DISE   - Request sleep study results from [facility] - Schedule drug-induced sleep endoscopy (DISE) to assess for complete concentric collapse - Proceed with insurance preapproval process for Inspire implant - Schedule Inspire implant procedure if DISE is favorable - Advised to avoid strenuous activities for one month post-procedure - Provide Inspire implant brochure  Insomnia Chronic difficulty falling and staying asleep. Managed with trazodone and clonidine. Insomnia may contribute to poor sleep quality but is not the primary issue given OSA diagnosis. - Continue trazodone and clonidine  Breast Cancer Right-sided lumpectomy with hematoma formation. No current issues. Known bleeder. Discussed potential for hematoma formation post-Inspire implant surgery. - Monitor for hematoma formation post-Inspire implant surgery  Chronic  nasal congestion and Environmental Allergies  Allergy management with azelastine spray. - Continue azelastine spray - unable to tolerate antihistamines in the past, will hold off for now  Follow-up - Follow up with surgery schedulers to set up DISE    Update 10/12/23 Assessment & Plan Obstructive Sleep Apnea Scheduled for Inspire implant due to CPAP non-tolerance. Procedure involves device placement to synchronize tongue movement with breathing, performed under general anesthesia. Risks include pain, swelling, hematoma, seroma, damage to local structures and anesthesia-related complications. Device activation in 4-6 weeks. - Prescribed acetaminophen, ibuprofen, and oxycodone for pain management. - Prescribe Augmentin for infection prophylaxis. - Advised against strenuous activities until device activation in 4-6 weeks.   Gastroesophageal Reflux Disease (GERD) GERD managed with omeprazole/sodium bicarbonate/TUMS and famotidine. Frequent calcium carbonate use. Recommended seaweed supplement for reflux management. - Recommend seaweed supplement Reflux Gourmet post-meals to manage reflux. - Provide information on reflux management and dietary recommendations.    Ashok Croon, MD Otolaryngology [facility] Phone: [phone] Fax: [phone]    10/13/2023, 9:55 AM

## 2023-10-12 NOTE — H&P (View-Only) (Signed)
 ENT Progress Note:   Update 10/12/2023  Discussed the use of AI scribe software for clinical note transcription with the patient, who gave verbal consent to proceed.  History of Present Illness Candace Campbell is a 68 year old female who presents for pre-operative counseling for an upcoming surgical procedure (Inspire).   She is concerned about potential bleeding during the surgery, referencing past experiences with hematomas following a lumpectomy/breast surgery and other surgeries. She has stopped taking aspirin and fish oil to mitigate bleeding risks.  She has a history of significant bleeding following a lumpectomy and uterine surgery, where she experienced a hematoma and perforation, respectively. Despite these events, she has not been diagnosed with any hematologic disorders, and previous lab work did not indicate any bleeding disorders.   She has a history of gastroesophageal reflux disease (GERD) and acid reflux, for which she takes PPI in the morning and famotidine at night. She also uses Tums as needed. She is trying to reduce her medication intake and manage her condition through dietary adjustments.  She has a known allergy to clindamycin and has been prescribed   Records Reviewed:  Initial Evaluation  Reason for Consult: OSA CPAP intolerance    Ref: Dr Robbin [facility]   HPI: Discussed the use of AI scribe software for clinical note transcription with the patient, who gave verbal consent to proceed.  History of Present Illness   Candace Campbell is a 68 year old female with obstructive sleep apnea who presents with interest in alternatives to CPAP therapy. She was referred by Dr. Vicie Grain for [facility] implant evaluation.  She has a history of obstructive sleep apnea, initially diagnosed in 2019, characterized by persistent snoring. Initially, she used a mouthpiece, which became unaffordable to replace after wearing out. She attempted CPAP therapy but faced difficulties with mask  fitting, resulting in discomfort and bloating from air intake. Despite trying various masks, she was unable to find a suitable fit, experiencing significant discomfort, including bruising and bloating, which prevented effective use of the CPAP.  In June 2024, she underwent a home sleep study, which included one night with an oral appliance and one night without and reports AHI of ~ 37 (we don't have records for review). This was prompted by memory problems and a recommendation following dementia testing, which she does not currently have but has a genetic predisposition for. The study was suggested to assess if her sleep quality was contributing to her memory issues.  She has a history of insomnia and takes trazodone, though she has reduced her dose since starting clonidine. Without medication, she has difficulty falling and staying asleep, often waking up hourly. Her sleep quality was better when using the oral appliance previously.  Her BMI was 21 in 2019, and she weighed 130 pounds at that time. She has experienced some weight fluctuation, currently weighing 132 pounds after gaining weight over Christmas, though she was 126 pounds for a period. No history of stroke or heart attack. No history of being on blood thinners.       Past Medical History:  Diagnosis Date   Anxiety    Blood dyscrasia    states daughter has Von Willebrand and she was tested 03-19-15 by Dr Lee Public   Breast cancer [facility]) 03/13/2015   right breast    Coronary atherosclerosis due to calcified coronary lesion    Depression    GERD (gastroesophageal reflux disease)    Headache    HX MIGRAINES   History of kidney  stones    Hyperlipidemia    OSA (obstructive sleep apnea)    Osteopenia    per notes from [facility]    Personal history of radiation therapy    PONV (postoperative nausea and vomiting)     Past Surgical History:  Procedure Laterality Date   ABDOMINAL HYSTERECTOMY     BREAST BIOPSY  Right 2016   BREAST LUMPECTOMY Right 2016   BREAST LUMPECTOMY WITH RADIOACTIVE SEED AND SENTINEL LYMPH NODE BIOPSY Right 03/26/2015   Procedure: BREAST LUMPECTOMY WITH RADIOACTIVE SEED AND SENTINEL LYMPH NODE BIOPSY;  Surgeon: Lockie Rima, MD;  Location: [facility];  Service: General;  Laterality: Right;   BUNIONECTOMY     BILAT   DRUG INDUCED ENDOSCOPY N/A 09/16/2023   Procedure: DRUG INDUCED SLEEP ENDOSCOPY;  Surgeon: Artice Last, MD;  Location: [facility];  Service: ENT;  Laterality: N/A;   LUMBAR LAMINECTOMY/DECOMPRESSION MICRODISCECTOMY Right 03/03/2016   Procedure: RIGHT LUMBAR FOUR-FIVE LUMBAR LAMINECTOMYAND MICRODISCECTOMY;  Surgeon: Yvonna Herder, MD;  Location: [facility];  Service: Neurosurgery;  Laterality: Right;  right    Family History  Problem Relation Age of Onset   Dementia Mother    Heart disease Father    Heart attack Father    Hyperlipidemia Sister    Breast cancer Maternal Grandmother 74    Social History:  reports that she has never smoked. She has never used smokeless tobacco. She reports current alcohol use of about 1.0 standard drink of alcohol per week. She reports that she does not use drugs.  Allergies:  Allergies  Allergen Reactions   Latex Swelling   Clindamycin/Lincomycin Hives   Peanut-Containing Drug Products Other (See Comments)    Headache/migraine   Sulfa Antibiotics Other (See Comments)    UNSPECIFIED REACTION  Pt does not remember the symptoms "They told me I should not take it anymore".   Zolpidem Other (See Comments)    Other Reaction(s): nightmares, extreme sedation   Iodine Swelling and Rash    Including Betadine     Medications: I have reviewed the patient's current medications.  The PMH, PSH, Medications, Allergies, and SH were reviewed and updated.  ROS: Constitutional: Negative for fever, weight loss and weight gain. Cardiovascular: Negative for chest pain and dyspnea on  exertion. Respiratory: Is not experiencing shortness of breath at rest. Gastrointestinal: Negative for nausea and vomiting. Neurological: Negative for headaches. Psychiatric: The patient is not nervous/anxious  Blood pressure (!) 154/83, pulse 77, weight 127 lb (57.6 kg), SpO2 97%. Body mass index is 22.14 kg/m.  PHYSICAL EXAM:  Exam: General: Well-developed, well-nourished Respiratory Respiratory effort: Equal inspiration and expiration without stridor Cardiovascular Peripheral Vascular: Warm extremities with equal color/perfusion  Cranial nerves I-XII are intact Head and Face Inspection: Normocephalic and atraumatic without mass or lesion Facial Strength: Facial motility symmetric and full bilaterally ENT Pinna: External ear intact and fully developed External canal: Canal is patent with intact skin Tympanic Membrane: Clear and mobile External Nose: No scar or anatomic deformity Lips, Teeth, and gums: Mucosa and teeth intact and viable Neck Neck and Trachea: Midline trachea without mass or lesion Thyroid: No mass or nodularity Lymphatics: No lymphadenopathy  Studies Reviewed: Home Sleep Study 12/19/2017  BMI 21.7 AHI 12.8 no mixed or central apneas   Home Sleep test 12/2022 [facility] - need to request report  Assessment/Plan: Encounter Diagnoses  Name Primary?   OSA (obstructive sleep apnea) Yes   Intolerance of continuous positive airway pressure (CPAP) ventilation  Chronic GERD      Assessment and Plan    Obstructive Sleep Apnea (OSA) OSA diagnosed in 2019. CPAP and oral appliance therapy were ineffective due to mask fit issues, side effects. Recent home sleep test (June 2024) indicated AHI of 37 per patient report. Need to request records of the most recent sleep study. No central or mixed events on the study done in 2019. BMI is not a concern for insurance approval (BMI of 23 today). Insomnia managed with trazodone and clonidine, but slept better when oral  appliance was in place. Discussed Inspire implant: stimulates hypoglossal nerve to reduce obstruction. Procedure involves two incisions, device placement, and wire tunneling. Advised to avoid strenuous activities for one month post-procedure. Risks and benefits discussed and she would like to proceed.  OSA, moderate to severe, without multilevel collapse, with failure to tolerate PAP therapy and/or more conservative measures. Presence of smaller/absent tonsils and larger tongue position (Friedman tongue position or modified Mallampati) suggests that hypopharyngeal/retrolingual collapse is contributing to the patient's OSA. Reather Campbell, M et al. Staging of obstructive sleep apnea/hypopnea syndrome: a guide to appropriate treatment. Laryngoscope, 2004 Mar, 114(3):454-9. PMID: [id number]) Options including positional therapy, weight loss, oral appliances, PAP and surgical correction discussed. Pt is not ideal candidate for oral appliance due to severity of OSA on most recent sleep study. Pt could be a candidate for Hypoglossal nerve stimulation (Inspire therapy) pending DISE   - Request sleep study results from [facility] - Schedule drug-induced sleep endoscopy (DISE) to assess for complete concentric collapse - Proceed with insurance preapproval process for Inspire implant - Schedule Inspire implant procedure if DISE is favorable - Advised to avoid strenuous activities for one month post-procedure - Provide Inspire implant brochure  Insomnia Chronic difficulty falling and staying asleep. Managed with trazodone and clonidine. Insomnia may contribute to poor sleep quality but is not the primary issue given OSA diagnosis. - Continue trazodone and clonidine  Breast Cancer Right-sided lumpectomy with hematoma formation. No current issues. Known bleeder. Discussed potential for hematoma formation post-Inspire implant surgery. - Monitor for hematoma formation post-Inspire implant surgery  Chronic  nasal congestion and Environmental Allergies  Allergy management with azelastine spray. - Continue azelastine spray - unable to tolerate antihistamines in the past, will hold off for now  Follow-up - Follow up with surgery schedulers to set up DISE    Update 10/12/23 Assessment & Plan Obstructive Sleep Apnea Scheduled for Inspire implant due to CPAP non-tolerance. Procedure involves device placement to synchronize tongue movement with breathing, performed under general anesthesia. Risks include pain, swelling, hematoma, seroma, damage to local structures and anesthesia-related complications. Device activation in 4-6 weeks. - Prescribed acetaminophen, ibuprofen, and oxycodone for pain management. - Prescribe Augmentin for infection prophylaxis. - Advised against strenuous activities until device activation in 4-6 weeks.   Gastroesophageal Reflux Disease (GERD) GERD managed with omeprazole/sodium bicarbonate/TUMS and famotidine. Frequent calcium carbonate use. Recommended seaweed supplement for reflux management. - Recommend seaweed supplement Reflux Gourmet post-meals to manage reflux. - Provide information on reflux management and dietary recommendations.    Artice Last, MD Otolaryngology [facility] Phone: [phone] Fax: [phone]    10/13/2023, 9:55 AM

## 2023-10-17 NOTE — Anesthesia Preprocedure Evaluation (Signed)
 Anesthesia Evaluation  Patient identified by MRN, date of birth, ID band Patient awake    Reviewed: Allergy & Precautions, NPO status , Patient's Chart, lab work & pertinent test results  History of Anesthesia Complications (+) PONV and history of anesthetic complications  Airway Mallampati: I  TM Distance: >3 FB Neck ROM: Full   Comment: Previous grade I view with Miller 2, easy mask Dental  (+) Dental Advisory Given   Pulmonary neg shortness of breath, sleep apnea , neg COPD, neg recent URI   Pulmonary exam normal breath sounds clear to auscultation       Cardiovascular (-) hypertension(-) angina + CAD  (-) Past MI, (-) Cardiac Stents and (-) CABG (-) dysrhythmias  Rhythm:Regular Rate:Normal  HLD  Echocardiogram 05/26/2021:  Normal LV systolic function with visual EF 55-60%. Left ventricle cavity  is normal in size. Normal left ventricular wall thickness. Normal global  wall motion. Normal diastolic filling pattern, normal LAP.  Mild tricuspid regurgitation. No evidence of pulmonary hypertension.  No prior study for comparison.   Exercise nuclear stress test  05/25/2021:  Normal ECG stress. The patient exercised for 7 minutes and 20 seconds of a Bruce protocol, achieving approximately 7.05 METs. Normal BP response. No chest pain or arrhythmias noted.  Myocardial perfusion is normal. Overall LV systolic function is normal without regional wall motion abnormalities. Stress LV EF: 45%. Visually appears normal.  No previous exam available for comparison. Low risk.     Neuro/Psych  Headaches, neg Seizures PSYCHIATRIC DISORDERS Anxiety Depression       GI/Hepatic Neg liver ROS,GERD  Medicated,,  Endo/Other  negative endocrine ROS    Renal/GU negative Renal ROS     Musculoskeletal Osteopenia    Abdominal   Peds  Hematology negative hematology ROS (+)   Anesthesia Other Findings   Reproductive/Obstetrics H/o right  breast cancer                             Anesthesia Physical Anesthesia Plan  ASA: 2  Anesthesia Plan: General   Post-op Pain Management: Tylenol PO (pre-op)*   Induction: Intravenous  PONV Risk Score and Plan: 4 or greater and Ondansetron, Dexamethasone, Midazolam and Treatment may vary due to age or medical condition  Airway Management Planned: Oral ETT  Additional Equipment:   Intra-op Plan:   Post-operative Plan: Extubation in OR  Informed Consent: I have reviewed the patients History and Physical, chart, labs and discussed the procedure including the risks, benefits and alternatives for the proposed anesthesia with the patient or authorized representative who has indicated his/her understanding and acceptance.     Dental advisory given  Plan Discussed with: CRNA and Anesthesiologist  Anesthesia Plan Comments: (Risks of general anesthesia discussed including, but not limited to, sore throat, hoarse voice, chipped/damaged teeth, injury to vocal cords, nausea and vomiting, allergic reactions, lung infection, heart attack, stroke, and death. All questions answered. )        Anesthesia Quick Evaluation

## 2023-10-18 ENCOUNTER — Other Ambulatory Visit: Payer: Self-pay

## 2023-10-18 ENCOUNTER — Ambulatory Visit (HOSPITAL_BASED_OUTPATIENT_CLINIC_OR_DEPARTMENT_OTHER): Payer: Self-pay | Admitting: Anesthesiology

## 2023-10-18 ENCOUNTER — Encounter (HOSPITAL_BASED_OUTPATIENT_CLINIC_OR_DEPARTMENT_OTHER): Payer: Self-pay | Admitting: Anesthesiology

## 2023-10-18 ENCOUNTER — Ambulatory Visit (HOSPITAL_BASED_OUTPATIENT_CLINIC_OR_DEPARTMENT_OTHER)
Admission: RE | Admit: 2023-10-18 | Discharge: 2023-10-18 | Disposition: A | Discharge status: Home / Self Care | Attending: Otolaryngology | Admitting: Otolaryngology

## 2023-10-18 ENCOUNTER — Encounter (HOSPITAL_BASED_OUTPATIENT_CLINIC_OR_DEPARTMENT_OTHER)
Admission: RE | Disposition: A | Discharge status: Home / Self Care | Payer: Self-pay | Source: Home / Self Care | Attending: Otolaryngology

## 2023-10-18 ENCOUNTER — Ambulatory Visit (HOSPITAL_COMMUNITY)

## 2023-10-18 DIAGNOSIS — I2584 Coronary atherosclerosis due to calcified coronary lesion: Secondary | ICD-10-CM | POA: Insufficient documentation

## 2023-10-18 DIAGNOSIS — R519 Headache, unspecified: Secondary | ICD-10-CM | POA: Insufficient documentation

## 2023-10-18 DIAGNOSIS — Z6822 Body mass index (BMI) 22.0-22.9, adult: Secondary | ICD-10-CM

## 2023-10-18 DIAGNOSIS — G4733 Obstructive sleep apnea (adult) (pediatric): Secondary | ICD-10-CM | POA: Insufficient documentation

## 2023-10-18 DIAGNOSIS — R0683 Snoring: Secondary | ICD-10-CM | POA: Insufficient documentation

## 2023-10-18 DIAGNOSIS — Z789 Other specified health status: Secondary | ICD-10-CM | POA: Diagnosis not present

## 2023-10-18 DIAGNOSIS — Z79899 Other long term (current) drug therapy: Secondary | ICD-10-CM | POA: Insufficient documentation

## 2023-10-18 DIAGNOSIS — K219 Gastro-esophageal reflux disease without esophagitis: Secondary | ICD-10-CM | POA: Insufficient documentation

## 2023-10-18 DIAGNOSIS — G4700 Insomnia, unspecified: Secondary | ICD-10-CM | POA: Diagnosis not present

## 2023-10-18 DIAGNOSIS — Z853 Personal history of malignant neoplasm of breast: Secondary | ICD-10-CM | POA: Diagnosis not present

## 2023-10-18 DIAGNOSIS — F32A Depression, unspecified: Secondary | ICD-10-CM | POA: Diagnosis not present

## 2023-10-18 DIAGNOSIS — F419 Anxiety disorder, unspecified: Secondary | ICD-10-CM | POA: Insufficient documentation

## 2023-10-18 DIAGNOSIS — Z01818 Encounter for other preprocedural examination: Secondary | ICD-10-CM

## 2023-10-18 DIAGNOSIS — I071 Rheumatic tricuspid insufficiency: Secondary | ICD-10-CM | POA: Insufficient documentation

## 2023-10-18 HISTORY — PX: IMPLANTATION OF HYPOGLOSSAL NERVE STIMULATOR: SHX6827

## 2023-10-18 SURGERY — INSERTION, HYPOGLOSSAL NERVE STIMULATOR
Anesthesia: General | Site: Chest | Laterality: Right

## 2023-10-18 MED ORDER — ACETAMINOPHEN 500 MG PO TABS
1000.0000 mg | ORAL_TABLET | Freq: Once | ORAL | Status: AC
  Administered 2023-10-18: 1000 mg via ORAL

## 2023-10-18 MED ORDER — MIDAZOLAM HCL 5 MG/5ML IJ SOLN
INTRAMUSCULAR | Status: DC | PRN
  Administered 2023-10-18: 2 mg via INTRAVENOUS

## 2023-10-18 MED ORDER — OXYCODONE HCL 5 MG/5ML PO SOLN: 5.0000 mg | Freq: Once | ORAL | Status: AC | PRN

## 2023-10-18 MED ORDER — FENTANYL CITRATE (PF) 100 MCG/2ML IJ SOLN
INTRAMUSCULAR | Status: AC
  Filled 2023-10-18: qty 2

## 2023-10-18 MED ORDER — 0.9 % SODIUM CHLORIDE (POUR BTL) OPTIME
TOPICAL | Status: DC | PRN
  Administered 2023-10-18: 120 mL

## 2023-10-18 MED ORDER — OXYCODONE HCL 5 MG PO TABS
ORAL_TABLET | ORAL | Status: AC
Start: 2023-10-18 — End: ?
  Filled 2023-10-18: qty 1

## 2023-10-18 MED ORDER — PHENYLEPHRINE HCL (PRESSORS) 10 MG/ML IV SOLN
INTRAVENOUS | Status: DC | PRN
  Administered 2023-10-18: 80 ug via INTRAVENOUS

## 2023-10-18 MED ORDER — LIDOCAINE-EPINEPHRINE 1 %-1:100000 IJ SOLN
INTRAMUSCULAR | Status: AC
Start: 2023-10-18 — End: ?
  Filled 2023-10-18: qty 3

## 2023-10-18 MED ORDER — DEXAMETHASONE SODIUM PHOSPHATE 4 MG/ML IJ SOLN
INTRAMUSCULAR | Status: DC | PRN
  Administered 2023-10-18: 10 mg via INTRAVENOUS

## 2023-10-18 MED ORDER — ONDANSETRON HCL 4 MG/2ML IJ SOLN
INTRAMUSCULAR | Status: AC
  Filled 2023-10-18: qty 2

## 2023-10-18 MED ORDER — EPHEDRINE SULFATE-NACL 50-0.9 MG/10ML-% IV SOSY
PREFILLED_SYRINGE | INTRAVENOUS | Status: DC | PRN
  Administered 2023-10-18: 10 mg via INTRAVENOUS

## 2023-10-18 MED ORDER — PROPOFOL 10 MG/ML IV BOLUS
INTRAVENOUS | Status: DC | PRN
  Administered 2023-10-18: 30 mg via INTRAVENOUS
  Administered 2023-10-18: 120 mg via INTRAVENOUS

## 2023-10-18 MED ORDER — OXYCODONE HCL 5 MG PO TABS
5.0000 mg | ORAL_TABLET | Freq: Once | ORAL | Status: AC | PRN
  Administered 2023-10-18: 5 mg via ORAL

## 2023-10-18 MED ORDER — ACETAMINOPHEN 500 MG PO TABS
ORAL_TABLET | ORAL | Status: AC
  Filled 2023-10-18: qty 2

## 2023-10-18 MED ORDER — CEFAZOLIN SODIUM-DEXTROSE 2-4 GM/100ML-% IV SOLN
INTRAVENOUS | Status: AC
  Filled 2023-10-18: qty 100

## 2023-10-18 MED ORDER — PROPOFOL 500 MG/50ML IV EMUL
INTRAVENOUS | Status: AC
  Filled 2023-10-18: qty 50

## 2023-10-18 MED ORDER — MIDAZOLAM HCL 2 MG/2ML IJ SOLN
INTRAMUSCULAR | Status: AC
Start: 2023-10-18 — End: ?
  Filled 2023-10-18: qty 2

## 2023-10-18 MED ORDER — SUCCINYLCHOLINE CHLORIDE 200 MG/10ML IV SOSY
PREFILLED_SYRINGE | INTRAVENOUS | Status: AC
  Filled 2023-10-18: qty 10

## 2023-10-18 MED ORDER — PROPOFOL 500 MG/50ML IV EMUL
INTRAVENOUS | Status: DC | PRN
  Administered 2023-10-18: 50 ug/kg/min via INTRAVENOUS

## 2023-10-18 MED ORDER — SCOPOLAMINE 1 MG/3DAYS TD PT72
MEDICATED_PATCH | TRANSDERMAL | Status: AC
  Filled 2023-10-18: qty 1

## 2023-10-18 MED ORDER — CEFAZOLIN SODIUM-DEXTROSE 2-3 GM-%(50ML) IV SOLR
INTRAVENOUS | Status: DC | PRN
  Administered 2023-10-18: 2 g via INTRAVENOUS

## 2023-10-18 MED ORDER — LIDOCAINE 2% (20 MG/ML) 5 ML SYRINGE
INTRAMUSCULAR | Status: AC
  Filled 2023-10-18: qty 5

## 2023-10-18 MED ORDER — DEXAMETHASONE SODIUM PHOSPHATE 10 MG/ML IJ SOLN
INTRAMUSCULAR | Status: AC
  Filled 2023-10-18: qty 1

## 2023-10-18 MED ORDER — FENTANYL CITRATE (PF) 100 MCG/2ML IJ SOLN
INTRAMUSCULAR | Status: DC | PRN
  Administered 2023-10-18 (×2): 50 ug via INTRAVENOUS

## 2023-10-18 MED ORDER — LACTATED RINGERS IV SOLN: INTRAVENOUS | Status: DC

## 2023-10-18 MED ORDER — PHENYLEPHRINE HCL-NACL 20-0.9 MG/250ML-% IV SOLN
INTRAVENOUS | Status: DC | PRN
  Administered 2023-10-18: 35 ug/min via INTRAVENOUS

## 2023-10-18 MED ORDER — LIDOCAINE-EPINEPHRINE 1 %-1:100000 IJ SOLN
INTRAMUSCULAR | Status: DC | PRN
  Administered 2023-10-18: 8 mL

## 2023-10-18 MED ORDER — ONDANSETRON HCL 4 MG/2ML IJ SOLN
INTRAMUSCULAR | Status: DC | PRN
  Administered 2023-10-18: 4 mg via INTRAVENOUS

## 2023-10-18 MED ORDER — LABETALOL HCL 5 MG/ML IV SOLN
INTRAVENOUS | Status: DC | PRN
  Administered 2023-10-18: 5 mg via INTRAVENOUS

## 2023-10-18 MED ORDER — AMISULPRIDE (ANTIEMETIC) 5 MG/2ML IV SOLN: 10.0000 mg | Freq: Once | INTRAVENOUS | Status: DC | PRN

## 2023-10-18 MED ORDER — LIDOCAINE 2% (20 MG/ML) 5 ML SYRINGE
INTRAMUSCULAR | Status: DC | PRN
  Administered 2023-10-18: 60 mg via INTRAVENOUS

## 2023-10-18 MED ORDER — FENTANYL CITRATE (PF) 100 MCG/2ML IJ SOLN
25.0000 ug | INTRAMUSCULAR | Status: DC | PRN
  Administered 2023-10-18: 50 ug via INTRAVENOUS

## 2023-10-18 MED ORDER — SUCCINYLCHOLINE CHLORIDE 200 MG/10ML IV SOSY
PREFILLED_SYRINGE | INTRAVENOUS | Status: DC | PRN
  Administered 2023-10-18: 60 mg via INTRAVENOUS

## 2023-10-18 SURGICAL SUPPLY — 68 items
BAG DECANTER FOR FLEXI CONT (MISCELLANEOUS) ×1 IMPLANT
BLADE CLIPPER SURG (BLADE) IMPLANT
BLADE SURG 15 STRL LF DISP TIS (BLADE) ×2 IMPLANT
CANISTER SUCT 1200ML W/VALVE (MISCELLANEOUS) ×1 IMPLANT
CORD BIPOLAR FORCEPS 12FT (ELECTRODE) ×2 IMPLANT
COVER PROBE CYLINDRICAL 5X96 (MISCELLANEOUS) ×2 IMPLANT
DERMABOND ADVANCED .7 DNX12 (GAUZE/BANDAGES/DRESSINGS) ×4 IMPLANT
DRAPE C-ARM 35X43 STRL (DRAPES) ×2 IMPLANT
DRAPE HEAD BAR (DRAPES) IMPLANT
DRAPE INCISE 23X17 STRL (DRAPES) IMPLANT
DRAPE INCISE IOBAN 23X17 STRL (DRAPES) ×1 IMPLANT
DRAPE INCISE IOBAN 66X45 STRL (DRAPES) ×2 IMPLANT
DRAPE MICROSCOPE WILD 40.5X102 (DRAPES) ×1 IMPLANT
DRAPE UTILITY XL STRL (DRAPES) IMPLANT
DRSG TEGADERM 2-3/8X2-3/4 SM (GAUZE/BANDAGES/DRESSINGS) ×4 IMPLANT
ELECT COATED BLADE 2.86 ST (ELECTRODE) ×1 IMPLANT
ELECT EMG 18 NIMS (NEUROSURGERY SUPPLIES) ×1 IMPLANT
ELECT REM PT RETURN 9FT ADLT (ELECTROSURGICAL) ×1 IMPLANT
ELECTRODE EMG 18 NIMS (NEUROSURGERY SUPPLIES) ×2 IMPLANT
ELECTRODE REM PT RTRN 9FT ADLT (ELECTROSURGICAL) ×2 IMPLANT
FORCEPS BIPOLAR SPETZLER 8 1.0 (NEUROSURGERY SUPPLIES) IMPLANT
GAUZE 4X4 16PLY ~~LOC~~+RFID DBL (SPONGE) ×2 IMPLANT
GAUZE SPONGE 4X4 12PLY STRL (GAUZE/BANDAGES/DRESSINGS) ×2 IMPLANT
GENERATOR PULSE INSPIRE (Generator) ×1 IMPLANT
GENERATOR PULSE INSPIRE IV (Generator) ×2 IMPLANT
GLOVE BIO SURGEON STRL SZ 6 (GLOVE) ×2 IMPLANT
GLOVE BIO SURGEON STRL SZ7.5 (GLOVE) ×2 IMPLANT
GLOVE SURG SS PI 6.5 STRL IVOR (GLOVE) IMPLANT
GLOVE SURG SS PI 7.0 STRL IVOR (GLOVE) IMPLANT
GOWN STRL REUS W/ TWL LRG LVL3 (GOWN DISPOSABLE) ×6 IMPLANT
GOWN STRL REUS W/ TWL XL LVL3 (GOWN DISPOSABLE) IMPLANT
IV CATH 18G SAFETY (IV SOLUTION) ×2 IMPLANT
KIT CVR 48X5XPRB PLUP LF (MISCELLANEOUS) IMPLANT
KIT NEURO ACCESSORY W/WRENCH (MISCELLANEOUS) IMPLANT
LEAD SENSING RESP INSPIRE (Lead) ×1 IMPLANT
LEAD SENSING RESP INSPIRE IV (Lead) ×1 IMPLANT
LEAD SLEEP STIM INSPIRE IV/V (Lead) ×2 IMPLANT
LEAD SLEEP STIMULATION INSPIRE (Lead) ×1 IMPLANT
LOOP VASCLR MAXI BLUE 18IN ST (MISCELLANEOUS) ×1 IMPLANT
LOOP VESSEL MINI RED (MISCELLANEOUS) ×1 IMPLANT
LOOPS VASCLR MAXI BLUE 18IN ST (MISCELLANEOUS) ×1 IMPLANT
MARKER SKIN DUAL TIP RULER LAB (MISCELLANEOUS) ×2 IMPLANT
NDL HYPO 25X1 1.5 SAFETY (NEEDLE) ×2 IMPLANT
NEEDLE HYPO 25X1 1.5 SAFETY (NEEDLE) ×1 IMPLANT
NS IRRIG 1000ML POUR BTL (IV SOLUTION) ×2 IMPLANT
PACK BASIN DAY SURGERY FS (CUSTOM PROCEDURE TRAY) ×2 IMPLANT
PACK ENT DAY SURGERY (CUSTOM PROCEDURE TRAY) ×2 IMPLANT
PASSER CATH 36 CODMAN DISP (NEUROSURGERY SUPPLIES) IMPLANT
PASSER CATH 38CM DISP (INSTRUMENTS) IMPLANT
PENCIL SMOKE EVACUATOR (MISCELLANEOUS) ×1 IMPLANT
PROBE NERVE STIMULATOR (NEUROSURGERY SUPPLIES) ×2 IMPLANT
REMOTE CONTROL SLEEP INSPIRE (MISCELLANEOUS) ×2 IMPLANT
SET WALTER ACTIVATION W/DRAPE (SET/KITS/TRAYS/PACK) ×1 IMPLANT
SHEARS HARMONIC 9CM CVD (BLADE) ×2 IMPLANT
SLEEVE SCD COMPRESS KNEE MED (STOCKING) ×2 IMPLANT
SPONGE INTESTINAL PEANUT (DISPOSABLE) ×4 IMPLANT
SUT MON AB 4-0 PS1 27 (SUTURE) ×4 IMPLANT
SUT SILK 2 0 SH (SUTURE) ×2 IMPLANT
SUT SILK 3 0 RB1 (SUTURE) ×2 IMPLANT
SUT SILK 3 0 REEL (SUTURE) IMPLANT
SUT SILK 3 0 SH 30 (SUTURE) IMPLANT
SUT SILK 3-0 18XBRD TIE BLK (SUTURE) IMPLANT
SUT VIC AB 3-0 SH 27X BRD (SUTURE) ×2 IMPLANT
SYR 10ML LL (SYRINGE) ×2 IMPLANT
SYR BULB EAR ULCER 3OZ GRN STR (SYRINGE) ×1 IMPLANT
TAPE CLOTH 3X10 WHT NS LF (GAUZE/BANDAGES/DRESSINGS) ×2 IMPLANT
TIE VASCULAR MAXI BLUE 18IN ST (MISCELLANEOUS) ×1 IMPLANT
TOWEL GREEN STERILE FF (TOWEL DISPOSABLE) ×2 IMPLANT

## 2023-10-18 NOTE — Op Note (Signed)
 Operative Report                                                            SURGEON: Ashok Croon, MD  ASSISTANT: Rockwell Germany, RN First Assist  PREOPERATIVE DIAGNOSIS: Obstructive sleep apnea.  POSTOPERATIVE DIAGNOSIS: Obstructive sleep apnea.  ANESTHESIA: General endotracheal.  ESTIMATED BLOOD LOSS: Less than 15 ml.  SPECIMENS: None.  DRAINS: None.  COMPLICATIONS: None.  Procedure: 12th cranial nerve (hypoglossal) stimulation implant along with placement of right pleural respiration sensor.  Electronic analysis of the implanted neurostimulator pulse generator system  Pre-Op Diagnosis: Moderate /Severe obstructive sleep apnea with positive airway pressure intolerance (ICD-10 G47.33). Post-Op Diagnosis: Moderate /Severe obstructive sleep apnea with positive pressure airway intolerance (ICD-10 G47.33).   Anesthesia: General endotracheal  Complications: None Brief Clinical History:     This is a 68 year old patient with a history of moderate /severe obstructive sleep apnea, who is intolerant and unable to achieve benefit from positive pressure therapy. Patient has passed the clinical, polysomnographic, and endoscopic screening criteria and presents today for the implant.    Procedure Description: The patient was brought to the Operating Room and was anesthetized via general endotracheal anesthesia without complication.  A shoulder roll was placed and the patient was prepped and draped in usual sterile fashion with the head turned to the left. Prior to prepping and draping, electrodes were placed in the genioglossus and styloglossus muscle and connected to the NIM box for intraoperative nerve monitoring.  A submental incision was made in the right upper neck approximately 2 cm below the mandible in the natural skin crease. Dissection was carried down through the subcutaneous tissue and platysma. The inferior border of the submandibular gland was identified as well as the  digastric tendon. The submandibular gland and the overlying fascia with the marginal mandibular nerve were retracted superiorly.  The digastric was retracted inferiorly.  Dissection was carried down into the digastric triangle where the hypoglossal nerve was identified in its usual fashion.   The mylohyoid muscle was retracted anteriorally, and the hypoglossal nerve was dissected up towards the floor of the mouth.  The lateral branches to retrusor muscles were identified, and tested intra-operatively using the NIM stimulator.  The cuff electrode for the hypoglossal nerve stimulator was placed distally to these branches on the medial nerve branch to the genioglossus muscle. Diagnostic evaluation confirmed activation of the genioglossus nerve, resulting in genioglossal activation and tongue protrusion, confirmed both visually and on NIM monitor. The stimulation electrode was then secured to the digastric tendon on its lateral surface with the provided anchor.  A second 5 cm incision was made in the right upper chest approximately 3 cm below the clavicle.  Dissection was carried down to the pectoralis muscle. An inferior pocket was created deep to the subcutaneous layer and superficial to the pectoralis major muscle.  In the upper portion of our chest pocket, pectoralis muscle fibers were then divided until we encountered anterior chest wall. External intercostal muscle was visualized and dissected until internal intercostal was visualized. Our respiratory sense lead was then tunneled in the fascial plane between external and internal intercostals. The distal anchor was secured to the anterior chest wall using 3-2.0 silk sutures and the proximal anchor was secured to the pec major facscia.  The stimulation lead was  then tunneled in a subplatysmal plane and brought out into the sub-clavicular pocket.  Pulse generator was then brought onto the field and the sense and stimulation pins were both connected to the  appropriate headers and secured using a torque screwdriver. The implantable pulse generator was placed in the subclavicular pocket and secured loosely to the pectoralis fascia using non-resorbable sutures.    Diagnostic evaluation of the system was run, confirming good respiratory wave from the sensing lead, good anterior tongue protrusion with stimulation, and normal impedence measurements.  All the wounds were thoroughly irrigated with bacitracin irrigation.  The wounds were then closed in multiple layers with deep Vicryl sutures and a 5-0 biosyn.  Dermabond was then applied to the skin.    The patient was then awakened, extubated, and transferred to Recovery Room in stable condition. All counts correct x2.  I was present for and performed the entire procedure.  The First Assist assisted throughout the case. It was essential in retraction and counter traction when needed to make the case progress smoothly. This retraction and assistance made it possible to see the tissue planes for the procedure. The assistance was needed for blood control, tissue re-approximation and assisted with closure of the incision site

## 2023-10-18 NOTE — Transfer of Care (Signed)
 Immediate Anesthesia Transfer of Care Note  Patient: Candace Campbell  Procedure(s) Performed: INSERTION HYPOGLOSSAL NERVE STIMULATOR (Right: Chest)  Patient Location: PACU  Anesthesia Type:General  Level of Consciousness: sedated  Airway & Oxygen Therapy: Patient Spontanous Breathing and Patient connected to face mask oxygen  Post-op Assessment: Report given to RN and Post -op Vital signs reviewed and stable  Post vital signs: Reviewed and stable  Last Vitals:  Vitals Value Taken Time  BP 125/76 10/18/23 1010  Temp    Pulse 73 10/18/23 1012  Resp 16 10/18/23 1012  SpO2 97 % 10/18/23 1012  Vitals shown include unfiled device data.  Last Pain:  Vitals:   10/18/23 0719  TempSrc: Temporal  PainSc: 0-No pain      Patients Stated Pain Goal: 1 (10/18/23 0719)  Complications: No notable events documented.

## 2023-10-18 NOTE — Discharge Instructions (Addendum)
 POST-OPERATIVE INSTRUCTIONS:   Please restart all of your home medications if you take anything on a daily basis.  You can resume regular diet after this procedure.   DIET: Resume normal diet HYGIENE: Please wait until 48 hours after surgery before getting incisions on neck, chest, and torso wet. In the first 48 hours after surgery, will likely need to take sponge baths. WOUND CARE: Please leave pressure dressing on for 48 hours after surgery. Gently place antibiotic ointment over incisions 2 times per day; use clean q-tip. May place a clean bandage over incisions as needed. After 48 hours, you may get incisions wet with warm soap and water, but do not soak the incisions.  Pat area dry gently.  Immediately place antibiotic ointment. Take oral antibiotics as prescribed If skin around incision starts to get red (> 1cm), swollen, and/or more painful, please call the office ACTIVITY: Try to avoid sleeping on the side of your surgery, to the extent possible.   You may walk for exercise starting the day after surgery. For 2 weeks: Do not pick up anything greater than 5 pounds with the hand/arm that's on the same side as the surgery.  After 2 weeks, you may increase weight to 10 pounds.   Consider performing neck rolls 10 clockwise and 10 counterclockwise 3x/day. For 4 weeks, no strenuous activity (running, jogging, lifting weights, gardening, sports) or until cleared by physician.   PAIN MEDICATIONS: You will be prescribed Oxycodone for pain.   Take Tylenol and Motrin extra strength every 6 hrs staggered 3 hrs apart from each other Avoid aspirin for 7 days after surgery Avoid direct heat (such as heating pads) to incision sites.   May gently place ice over surgery sites as needed.  Please place a thin clean towel over skin first and then place ice bag over towel.  Ice for 10 minutes at a time only.  POST-OPERATIVE CLINIC APPOINTMENTS: 1 week: suture removal and wound check in the office.  1  month: device activation and wound check in the office. 2.5 months: check in visit to assess usage. 3-4 months: titration sleep study based on usage of >4 hrs/night.  4 months: final wound check in the office.  Yearly: device check at office.  SCAR CARE: After incisions have healed, you will have a scar, which will continue to evolve over the course of 12 months.  Caring for your incision scars will help them to be as minimal as possible. If you are out in the sun with incision exposure, please remember to place sunscreen over the incision and surrounding skin.   You may use vitamin E or "Scar ointment/cream" to help soften scar.  Please wait one month after surgery before starting this.      Post Anesthesia Home Care Instructions  Activity: Get plenty of rest for the remainder of the day. A responsible individual must stay with you for 24 hours following the procedure.  For the next 24 hours, DO NOT: -Drive a car -Advertising copywriter -Drink alcoholic beverages -Take any medication unless instructed by your physician -Make any legal decisions or sign important papers.  Meals: Start with liquid foods such as gelatin or soup. Progress to regular foods as tolerated. Avoid greasy, spicy, heavy foods. If nausea and/or vomiting occur, drink only clear liquids until the nausea and/or vomiting subsides. Call your physician if vomiting continues.  Special Instructions/Symptoms: Your throat may feel dry or sore from the anesthesia or the breathing tube placed in your throat during surgery.  If this causes discomfort, gargle with warm salt water. The discomfort should disappear within 24 hours.  If you had a scopolamine patch placed behind your ear for the management of post- operative nausea and/or vomiting:  1. The medication in the patch is effective for 72 hours, after which it should be removed.  Wrap patch in a tissue and discard in the trash. Wash hands thoroughly with soap and water. 2. You  may remove the patch earlier than 72 hours if you experience unpleasant side effects which may include dry mouth, dizziness or visual disturbances. 3. Avoid touching the patch. Wash your hands with soap and water after contact with the patch.     Last received tylenol at 730am

## 2023-10-18 NOTE — Anesthesia Postprocedure Evaluation (Signed)
 Anesthesia Post Note  Patient: Candace Campbell  Procedure(s) Performed: INSERTION HYPOGLOSSAL NERVE STIMULATOR (Right: Chest)     Patient location during evaluation: PACU Anesthesia Type: General Level of consciousness: awake Pain management: pain level controlled Vital Signs Assessment: post-procedure vital signs reviewed and stable Respiratory status: spontaneous breathing, nonlabored ventilation and respiratory function stable Cardiovascular status: blood pressure returned to baseline and stable Postop Assessment: no apparent nausea or vomiting Anesthetic complications: no   No notable events documented.  Last Vitals:  Vitals:   10/18/23 1130 10/18/23 1150  BP: 137/82 (!) 140/81  Pulse: 74 76  Resp: 13 14  Temp:  (!) 36.2 C  SpO2: 92% 96%    Last Pain:  Vitals:   10/18/23 1150  TempSrc:   PainSc: 4                  Conard Decent

## 2023-10-18 NOTE — Interval H&P Note (Signed)
 History and Physical Interval Note:  10/18/2023 7:15 AM  Candace Campbell  has presented today for surgery, with the diagnosis of OBSTRUCTIVE SLEEP APNEA.  The various methods of treatment have been discussed with the patient and family. After consideration of risks, benefits and other options for treatment, the patient has consented to  Procedure(s): INSERTION, HYPOGLOSSAL NERVE STIMULATOR (N/A) as a surgical intervention.  The patient's history has been reviewed, patient examined, no change in status, stable for surgery.  I have reviewed the patient's chart and labs.  Questions were answered to the patient's satisfaction.     Aymar Whitfill

## 2023-10-18 NOTE — Anesthesia Procedure Notes (Signed)
 Procedure Name: Intubation Date/Time: 10/18/2023 7:50 AM  Performed by: Arvilla Birmingham, CRNAPre-anesthesia Checklist: Patient identified, Emergency Drugs available, Suction available and Patient being monitored Patient Re-evaluated:Patient Re-evaluated prior to induction Oxygen Delivery Method: Circle system utilized Preoxygenation: Pre-oxygenation with 100% oxygen Induction Type: IV induction Ventilation: Mask ventilation without difficulty Laryngoscope Size: Mac and 3 Grade View: Grade I Tube type: Oral Tube size: 7.0 mm Number of attempts: 1 Airway Equipment and Method: Stylet and Oral airway Placement Confirmation: ETT inserted through vocal cords under direct vision, positive ETCO2 and breath sounds checked- equal and bilateral Secured at: 20 cm Tube secured with: Tape Dental Injury: Teeth and Oropharynx as per pre-operative assessment

## 2023-10-20 ENCOUNTER — Telehealth (INDEPENDENT_AMBULATORY_CARE_PROVIDER_SITE_OTHER): Payer: Self-pay

## 2023-10-20 NOTE — Telephone Encounter (Signed)
 Patient was concerned about redness around incision. Per dr. Soldatova, she should continue her antibiotics and watch for any worsening of the redness. If it gets any worse she should give us  a call and we will schedule her an appointment.

## 2023-10-20 NOTE — Telephone Encounter (Signed)
 Patient left a message with concerns of redness at surgical site,   I left her a message that its normal as long as there is no drainage or its hot to the touch or she is running a fever.  Advised in message to call us  back with additional questions

## 2023-10-21 ENCOUNTER — Telehealth (INDEPENDENT_AMBULATORY_CARE_PROVIDER_SITE_OTHER): Payer: Self-pay | Admitting: Otolaryngology

## 2023-10-21 ENCOUNTER — Encounter (HOSPITAL_BASED_OUTPATIENT_CLINIC_OR_DEPARTMENT_OTHER): Payer: Self-pay | Admitting: Otolaryngology

## 2023-10-21 NOTE — Telephone Encounter (Signed)
 Patient LVM concerned and wanted someone to call her back.  She states that one of the incision sites from her INSPIRE surgery (10/18/2023) is red and inflamed.  Please call [phone]

## 2023-10-31 ENCOUNTER — Encounter (INDEPENDENT_AMBULATORY_CARE_PROVIDER_SITE_OTHER): Admitting: Otolaryngology

## 2023-11-01 ENCOUNTER — Ambulatory Visit (INDEPENDENT_AMBULATORY_CARE_PROVIDER_SITE_OTHER): Admitting: Otolaryngology

## 2023-11-01 VITALS — BP 136/85 | HR 71

## 2023-11-01 DIAGNOSIS — G4733 Obstructive sleep apnea (adult) (pediatric): Secondary | ICD-10-CM

## 2023-11-01 DIAGNOSIS — Z789 Other specified health status: Secondary | ICD-10-CM

## 2023-11-01 NOTE — Progress Notes (Signed)
 ENT Progress Note:   Update 11/01/2023  Discussed the use of AI scribe software for clinical note transcription with the patient, who gave verbal consent to proceed.  History of Present Illness  Candace Campbell is a 68 year old female who presents for postoperative follow-up after Inspire implant two weeks ago.  She initially developed redness around the chest wall incision. The redness around the incision has subsided, though some redness persists. Previously, the area was more erythematous but has shown improvement over time. There are no signs of infection such as worsening pain or drainage.  For her post-op pain, she took ibuprofen  for pain management but discontinued it due to exacerbation of her GERD symptoms. The area feels ' tight' and 'hard.'  She has a history of falling and breaking her jaw 25 years ago, resulting in a persistent numb feeling in the corner of her mouth due to scar tissue. Tongue movement is intact, as she can move it left and right without difficulty.  She experienced a reaction with redness on the other side of her chest as well (left side)  after the surgery, but it resolved.   She is currently walking for exercise and has refrained from strenuous activities. She has paused her Pilates routine due to the surgery.  Records Reviewed:  Initial Evaluation  Reason for Consult: OSA CPAP intolerance    Ref: Dr Robbin [facility]   HPI: Discussed the use of AI scribe software for clinical note transcription with the patient, who gave verbal consent to proceed.  History of Present Illness   Candace Campbell is a 68 year old female with obstructive sleep apnea who presents with interest in alternatives to CPAP therapy. She was referred by Dr. Vicie Grain for [facility] implant evaluation.  She has a history of obstructive sleep apnea, initially diagnosed in 2019, characterized by persistent snoring. Initially, she used a mouthpiece, which became unaffordable to replace after  wearing out. She attempted CPAP therapy but faced difficulties with mask fitting, resulting in discomfort and bloating from air intake. Despite trying various masks, she was unable to find a suitable fit, experiencing significant discomfort, including bruising and bloating, which prevented effective use of the CPAP.  In June 2024, she underwent a home sleep study, which included one night with an oral appliance and one night without and reports AHI of ~ 37 (we don't have records for review). This was prompted by memory problems and a recommendation following dementia testing, which she does not currently have but has a genetic predisposition for. The study was suggested to assess if her sleep quality was contributing to her memory issues.  She has a history of insomnia and takes trazodone , though she has reduced her dose since starting clonidine. Without medication, she has difficulty falling and staying asleep, often waking up hourly. Her sleep quality was better when using the oral appliance previously.  Her BMI was 21 in 2019, and she weighed 130 pounds at that time. She has experienced some weight fluctuation, currently weighing 132 pounds after gaining weight over Christmas, though she was 126 pounds for a period. No history of stroke or heart attack. No history of being on blood thinners.       Past Medical History:  Diagnosis Date   Anxiety    Blood dyscrasia    states daughter has Von Willebrand and she was tested 03-19-15 by Dr Lee Public   Breast cancer [facility]) 03/13/2015   right breast    Coronary atherosclerosis due to  calcified coronary lesion    Depression    GERD (gastroesophageal reflux disease)    Headache    HX MIGRAINES   History of kidney stones    Hyperlipidemia    OSA (obstructive sleep apnea)    Osteopenia    per notes from [facility]    Personal history of radiation therapy    PONV (postoperative nausea and vomiting)     Past Surgical History:   Procedure Laterality Date   ABDOMINAL HYSTERECTOMY     BREAST BIOPSY Right 2016   BREAST LUMPECTOMY Right 2016   BREAST LUMPECTOMY WITH RADIOACTIVE SEED AND SENTINEL LYMPH NODE BIOPSY Right 03/26/2015   Procedure: BREAST LUMPECTOMY WITH RADIOACTIVE SEED AND SENTINEL LYMPH NODE BIOPSY;  Surgeon: Lockie Rima, MD;  Location: [facility];  Service: General;  Laterality: Right;   BUNIONECTOMY     BILAT   DRUG INDUCED ENDOSCOPY N/A 09/16/2023   Procedure: DRUG INDUCED SLEEP ENDOSCOPY;  Surgeon: Artice Last, MD;  Location: [facility];  Service: ENT;  Laterality: N/A;   IMPLANTATION OF HYPOGLOSSAL NERVE STIMULATOR Right 10/18/2023   Procedure: INSERTION HYPOGLOSSAL NERVE STIMULATOR;  Surgeon: Artice Last, MD;  Location: [facility];  Service: ENT;  Laterality: Right;   LUMBAR LAMINECTOMY/DECOMPRESSION MICRODISCECTOMY Right 03/03/2016   Procedure: RIGHT LUMBAR FOUR-FIVE LUMBAR LAMINECTOMYAND MICRODISCECTOMY;  Surgeon: Yvonna Herder, MD;  Location: [facility];  Service: Neurosurgery;  Laterality: Right;  right    Family History  Problem Relation Age of Onset   Dementia Mother    Heart disease Father    Heart attack Father    Hyperlipidemia Sister    Breast cancer Maternal Grandmother 30    Social History:  reports that she has never smoked. She has never used smokeless tobacco. She reports current alcohol use of about 1.0 standard drink of alcohol per week. She reports that she does not use drugs.  Allergies:  Allergies  Allergen Reactions   Latex Swelling   Clindamycin/Lincomycin Hives   Peanut-Containing Drug Products Other (See Comments)    Headache/migraine   Sulfa Antibiotics Other (See Comments)    UNSPECIFIED REACTION  Pt does not remember the symptoms "They told me I should not take it anymore".   Zolpidem  Other (See Comments)    Other Reaction(s): nightmares, extreme sedation   Iodine Swelling and Rash    Including  Betadine     Medications: I have reviewed the patient's current medications.  The PMH, PSH, Medications, Allergies, and SH were reviewed and updated.  ROS: Constitutional: Negative for fever, weight loss and weight gain. Cardiovascular: Negative for chest pain and dyspnea on exertion. Respiratory: Is not experiencing shortness of breath at rest. Gastrointestinal: Negative for nausea and vomiting. Neurological: Negative for headaches. Psychiatric: The patient is not nervous/anxious  Blood pressure 136/85, pulse 71, SpO2 94%. There is no height or weight on file to calculate BMI.  PHYSICAL EXAM:  Exam: General: Well-developed, well-nourished Respiratory Respiratory effort: Equal inspiration and expiration without stridor Cardiovascular Peripheral Vascular: Warm extremities with equal color/perfusion  Cranial nerves I-XII are intact Head and Face Inspection: Normocephalic and atraumatic without mass or lesion Facial Strength: Facial motility symmetric and full bilaterally CN XII intact  ENT Pinna: External ear intact and fully developed External canal: Canal is patent with intact skin Tympanic Membrane: Clear and mobile External Nose: No scar or anatomic deformity Lips, Teeth, and gums: Mucosa and teeth intact and viable Neck Neck and Trachea: Midline trachea without mass or  lesion Thyroid : No mass or nodularity Lymphatics: No lymphadenopathy Chest wall incision with minimal erythema around the incision, no fluctuance or significant swelling noted   Studies Reviewed: Home Sleep Study 12/19/2017  BMI 21.7 AHI 12.8 no mixed or central apneas   Home Sleep test 12/2022 [facility]   Assessment/Plan: Encounter Diagnoses  Name Primary?   OSA (obstructive sleep apnea) Yes   Intolerance of continuous positive airway pressure (CPAP) ventilation       Assessment and Plan    Obstructive Sleep Apnea (OSA) OSA diagnosed in 2019. CPAP and oral appliance therapy were  ineffective due to mask fit issues, side effects. Recent home sleep test (June 2024) indicated AHI of 37 per patient report. Need to request records of the most recent sleep study. No central or mixed events on the study done in 2019. BMI is not a concern for insurance approval (BMI of 23 today). Insomnia managed with trazodone  and clonidine, but slept better when oral appliance was in place. Discussed Inspire implant: stimulates hypoglossal nerve to reduce obstruction. Procedure involves two incisions, device placement, and wire tunneling. Advised to avoid strenuous activities for one month post-procedure. Risks and benefits discussed and she would like to proceed.  OSA, moderate to severe, without multilevel collapse, with failure to tolerate PAP therapy and/or more conservative measures. Presence of smaller/absent tonsils and larger tongue position (Friedman tongue position or modified Mallampati) suggests that hypopharyngeal/retrolingual collapse is contributing to the patient's OSA. Reather Campbell, M et al. Staging of obstructive sleep apnea/hypopnea syndrome: a guide to appropriate treatment. Laryngoscope, 2004 Mar, 114(3):454-9. PMID: [id number]) Options including positional therapy, weight loss, oral appliances, PAP and surgical correction discussed. Pt is not ideal candidate for oral appliance due to severity of OSA on most recent sleep study. Pt could be a candidate for Hypoglossal nerve stimulation (Inspire therapy) pending DISE   - Request sleep study results from [facility] - Schedule drug-induced sleep endoscopy (DISE) to assess for complete concentric collapse - Proceed with insurance preapproval process for Inspire implant - Schedule Inspire implant procedure if DISE is favorable - Advised to avoid strenuous activities for one month post-procedure - Provide Inspire implant brochure  Insomnia Chronic difficulty falling and staying asleep. Managed with trazodone  and clonidine. Insomnia  may contribute to poor sleep quality but is not the primary issue given OSA diagnosis. - Continue trazodone  and clonidine  Breast Cancer Right-sided lumpectomy with hematoma formation. No current issues. Known bleeder. Discussed potential for hematoma formation post-Inspire implant surgery. - Monitor for hematoma formation post-Inspire implant surgery  Chronic nasal congestion and Environmental Allergies  Allergy management with azelastine  spray. - Continue azelastine  spray - unable to tolerate antihistamines in the past, will hold off for now  Follow-up - Follow up with surgery schedulers to set up DISE    Update 11/01/23 Assessment & Plan Obstructive Sleep Apnea  s/p Inspire implant 2 weeks ago Healing as expected at this point in recover, no signs of infection on exam, redness around chest wall incision was likely 2/2 allergic reaction to tape or betadine, was mild overall and self-resolved - Advise gentle neck stretching exercises. - Refrain from strenuous activities for 2-3 weeks. - Encouraged walking as exercise.  Allergic reaction to surgical materials Mild allergic reaction suspected from Betadine or dressing/tape. No infection signs on exam, mild redness and irritation. - Advise avoiding sun exposure to incision site, apply sunscreen and limit sun exposure - Instruct to return if incision issues or other concerns arise.  Gastroesophageal Reflux Disease (GERD)  GERD managed with omeprazole /sodium bicarbonate/TUMS and famotidine. Frequent calcium carbonate use. Recommended seaweed supplement for reflux management. -  Reflux Gourmet post-meals to manage reflux. - Provided information on reflux management and dietary recommendations  Has activation appt with Dr Vicie Grain in 2 weeks - proceed as scheduled  RTC if any issues with incision or healing or for any other ENT related issues   Artice Last, MD Otolaryngology [facility] Phone: [phone] Fax:  [phone]    11/01/2023, 4:14 PM

## 2024-03-13 ENCOUNTER — Ambulatory Visit (INDEPENDENT_AMBULATORY_CARE_PROVIDER_SITE_OTHER): Payer: Medicare Other | Admitting: Neurology

## 2024-03-13 VITALS — BP 144/86 | HR 80 | Ht 63.5 in | Wt 131.2 lb

## 2024-03-13 DIAGNOSIS — G3184 Mild cognitive impairment, so stated: Secondary | ICD-10-CM | POA: Diagnosis not present

## 2024-03-13 NOTE — Progress Notes (Unsigned)
 [facility]    Provider:  Dr Candace Campbell, * Primary Care Provider:  Chrystal Lamarr RAMAN, MD  CC:  Memory concerns  03/13/2024: Patient was last seen in April 2024.  Being followed for memory problems.  Labs reviewed from that date included a normal thyroid , normal CBC, normal homocystine, normal B1, unremarkable CMP, normal B1, normal MMA and B12 and folate, genetic testing showed E3 E4 or 1 copy of the APO E4 variant for Alzheimer's risk, ATN profile was negative for amyloid biomarkers in the blood; completely normal.  MRI of the brain showed mild to moderate chronic microvascular ischemic change and a small chronic lacunar infarct in the right midbrain.  Mild generalized cortical atrophy without lobar predominance.    She had inspire surgery and it works very well. She is also taking o2 overnight for hypoxemia. She is feeling better. MoCA is improved today from 24/30 a year ago to 28/30. She started on inspire in May and now only about 3 weeks, s not long enough to see maximum improvement yet she feels tremendously better in the morning. Untreated sleep disorder for 20 years, reassured her it may take longer and her improvement in such a short time s excellent.   No other focal neurologic deficits, associated symptoms, inciting events or modifiable factors. Patient complains of symptoms per HPI as well as the following symptoms: none . Pertinent negatives and positives per HPI. All others negative   12/30/2022: Taken together, Candace Campbell's cognitive profile is grossly intact with the exception of a few subtle cognitive inefficiencies, which may reflect some of the problems that she reported noticing in her daily life, but do not rise to the level of a diagnosable disorder at this time. Based on all available information, it seems possible that these inefficiencies may reflect the consequences of chronic stress, sleep disruption, possible  untreated/less than optimally treated OSA, and mild to moderate chronic cerebrovascular compromise noted on her recent MRI. There is no overt evidence to suggest the presence of a neurodegenerative process. The possibility of ADHD also seems less likely, given the reported history of significant anxiety and migraines which were present from a very young age and occurred at the same time as her inattentive symptoms. She also did not report significant disruption in academic or vocational performance and her cognitive profile is not particularly convincing for such a disorder   10/04/2022: Worse about losing things and immediately forgetting what she was doing, she missed her last appointment. She will run into someone she knows and forgets her name. Hard time spelling, can't remember her words. She has gone to he work missing things. Hard time with math. Progressive memory loss. Having problems adding. She was testing for ADD and does not have it.  She was tested just for ADD NOT formal memory testing at least 4-5 years ago. Saw her in 2021 also concerned at that time for memory loss. Yesterday she was going to the gym, she goes every morning, she is stronger, feeling very positive about that, anxiety and stress better she has situational anxiety her son and grandson lives with them and there are social problems in her life for years. But her anxiety has improved over the years, her son has gotten much better and doing better and better dad and things are better at home. Worsened the last year. Mother had dementia and started in her 19s and 73s and in 39s was in a memory care unit. She has  episodes she cannot remember at all. Forgets where she is going. She has gotten lost. No other focal neurologic deficits, associated symptoms, inciting events or modifiable factors.  Patient complains of symptoms per HPI as well as the following symptoms: none . Pertinent negatives and positives per HPI. All others  negative   Reviewed notes, labs and imaging from outside physicians, which showed:     Latest Ref Rng & Units 10/04/2022   10:12 AM 04/15/2020   10:34 AM 06/09/2018    9:53 AM  CMP  Glucose 70 - 99 mg/dL 90  82  78   BUN 8 - 27 mg/dL 13  16  17    Creatinine 0.57 - 1.00 mg/dL 9.06  9.24  9.13   Sodium 134 - 144 mmol/L 143  140  140   Potassium 3.5 - 5.2 mmol/L 4.4  4.5  4.6   Chloride 96 - 106 mmol/L 101  101  105   CO2 20 - 29 mmol/L 27  28  28    Calcium 8.7 - 10.3 mg/dL 9.8  9.3  9.1   Total Protein 6.0 - 8.5 g/dL 6.8  6.1  6.5   Total Bilirubin 0.0 - 1.2 mg/dL 0.3  0.3  0.4   Alkaline Phos 44 - 121 IU/L 47  48  44   AST 0 - 40 IU/L 15  24  19    ALT 0 - 32 IU/L 11  17  15        Latest Ref Rng & Units 10/04/2022   10:12 AM 04/15/2020   10:34 AM 06/09/2018    9:53 AM  CBC  WBC 3.4 - 10.8 x10E3/uL 5.3  4.2  5.1   Hemoglobin 11.1 - 15.9 g/dL 85.8  87.3  87.0   Hematocrit 34.0 - 46.6 % 43.2  38.9  40.4   Platelets 150 - 450 x10E3/uL 234  239  194      Patient complains of symptoms per HPI as well as the following symptoms: none . Pertinent negatives and positives per HPI. All others negative  HPI 04/15/2020 :  Candace Campbell is a 68 y.o. female here as requested by Candace Campbell, * for word loss and short-term memory loss. PMHx depression and anxiety. I reviewed Candace. Dionne notes, last seen for chief complaint my mother passed away and my anxiety has been terrible.  Patient last reported that her mother passed away and her anxiety had been terrible, medications are working well, occasional depressive symptoms of low energy, lack of motivation, irritability, crying spells due to thinking about the loss of her mother, having difficulty with finding words and short-term memory which she is requesting referral to neurology and therapy, denies suicidal or homicidal ideations, hallucinations or delusions, she was on Strattera  for ADHD but asked to be weaned off of that, diagnosed by  Candace. 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  She started noticing symptoms a long time ago, she puts letters in the wrong position, someone will tell her something and she has no recollection of it, seems so much worse this year, her mother had dementia unknown type started around 20 and she dies at the age of 17. She has extreme anxiety she is going to counseling, she is being treated, but still struggling, if she tries to take inventory she cannot get the inventory right has to count it multiple times and then gets frustrated.  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 She will make mistakes on orders. She sent her invoice in and she will have added it up wrong, getting frustrating. She already had formal neurocognitive testing and diagnosed with anxiety not ADHD. She has been forgetful since a cild, she would forget tings in her locker, short-term memory loss is not new but the last 6-7 years it has worse with short-term memory loss, concentrating, she has problems getting words out and feels really slow she has to really think about it. She forgets the words of things at home. She has sleep apnea and uses a sleep appliance.   Reviewed notes, labs and imaging from outside physicians, which showed: see above  Review of Systems: Patient complains of symptoms per HPI as well as the following symptoms: anxiety. Pertinent negatives and positives per HPI. All others negative.   Social History   Socioeconomic History   Marital status: Married    Spouse name: Signe   Number of children: 2   Years of education: Not on file   Highest education level: Bachelor's degree (e.g., BA, AB, BS)  Occupational History   Not on file  Tobacco Use   Smoking status: Never   Smokeless tobacco: Never  Vaping Use   Vaping status: Never Used  Substance and Sexual Activity   Alcohol use: Yes     Alcohol/week: 1.0 standard drink of alcohol    Types: 1 Standard drinks or equivalent per week    Comment: social   Drug use: No   Sexual activity: Yes    Birth control/protection: Surgical    Comment: hyst  Other Topics Concern   Not on file  Social History Narrative   Lives at home with spouse. Son and grandson live with them.   Right handed   Caffeine: occasional green tea 1 cup   Social Drivers of Corporate investment banker Strain: Not on file  Food Insecurity: Not on file  Transportation Needs: Not on file  Physical Activity: Not on file  Stress: Not on file  Social Connections: Not on file  Intimate Partner Violence: Not on file    Family History  Problem Relation Age of Onset   Dementia Mother    Heart disease Father    Heart attack Father    Hyperlipidemia Sister    Breast cancer Maternal Grandmother 65    Past Medical History:  Diagnosis Date   Anxiety    Blood dyscrasia    states daughter has Von Willebrand and she was tested 03-19-15 by Candace Odean   Breast cancer [facility]) 03/13/2015   right breast    Coronary atherosclerosis due to calcified coronary lesion    Depression    GERD (gastroesophageal reflux disease)    Headache    HX MIGRAINES   History of kidney stones    Hyperlipidemia    OSA (obstructive sleep apnea)    Osteopenia    per notes from [facility]    Personal history of radiation therapy    PONV (postoperative nausea and vomiting)     Patient Active Problem List   Diagnosis Date Noted   OSA (obstructive sleep apnea) 09/16/2023   Intolerance of continuous positive airway pressure (CPAP) ventilation 09/16/2023   HNP (herniated nucleus pulposus), lumbar 03/03/2016   At high risk for bleeding 03/19/2015   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 female breast (HCC) 03/17/2015    Past Surgical History:  Procedure Laterality Date   ABDOMINAL HYSTERECTOMY  BREAST BIOPSY Right 2016   BREAST LUMPECTOMY Right  2016   BREAST LUMPECTOMY WITH RADIOACTIVE SEED AND SENTINEL LYMPH NODE BIOPSY Right 03/26/2015   Procedure: BREAST LUMPECTOMY WITH RADIOACTIVE SEED AND SENTINEL LYMPH NODE BIOPSY;  Surgeon: Jina Nephew, MD;  Location: [facility];  Service: General;  Laterality: Right;   BUNIONECTOMY     BILAT   DRUG INDUCED ENDOSCOPY N/A 09/16/2023   Procedure: DRUG INDUCED SLEEP ENDOSCOPY;  Surgeon: Okey Burns, MD;  Location: [facility];  Service: ENT;  Laterality: N/A;   IMPLANTATION OF HYPOGLOSSAL NERVE STIMULATOR Right 10/18/2023   Procedure: INSERTION HYPOGLOSSAL NERVE STIMULATOR;  Surgeon: Okey Burns, MD;  Location: [facility];  Service: ENT;  Laterality: Right;   LUMBAR LAMINECTOMY/DECOMPRESSION MICRODISCECTOMY Right 03/03/2016   Procedure: RIGHT LUMBAR FOUR-FIVE LUMBAR LAMINECTOMYAND MICRODISCECTOMY;  Surgeon: Lamar Peaches, MD;  Location: [facility];  Service: Neurosurgery;  Laterality: Right;  right    Current Outpatient Medications  Medication Sig Dispense Refill   acetaminophen  (TYLENOL ) 500 MG tablet Take 1 tablet (500 mg total) by mouth every 6 (six) hours as needed. Please take every 6 hrs and stagger with Motrin  3 hrs apart from each other. Please do not exceed maximum daily dose to avoid liver damage 30 tablet 1   aspirin  EC 81 MG tablet Take 1 tablet (81 mg total) by mouth daily. Swallow whole. 30 tablet 11   atorvastatin (LIPITOR) 20 MG tablet Take 20 mg by mouth daily.     azelastine  (ASTELIN ) 0.1 % nasal spray U 1 SPR IEN BID     busPIRone  (BUSPAR ) 15 MG tablet TK 1 AND 1/2 T PO AM AND TK 2 TS QHS FOR ANXIETY PRN. MAY TAKE NOON DOSE IF NEEDED FOR SEVERE ANXIETY     Calcium-Magnesium -Vitamin D (CALCIUM 500 PO) Take 1 tablet by mouth daily.     Denosumab  (PROLIA  ) Inject into the skin every 6 (six) months.     DULoxetine (CYMBALTA) 60 MG capsule Take 60 mg by mouth daily.     famotidine (PEPCID) 20 MG tablet Take 20 mg by mouth 2  (two) times daily.     lamoTRIgine  (LAMICTAL ) 200 MG tablet Take 150 mg by mouth at bedtime.     MAGNESIUM  GLYCINATE ADVANCED PO Take by mouth.     Multiple Vitamin (MULTIVITAMIN WITH MINERALS) TABS tablet Take 1 tablet by mouth daily.     Omega-3 Fatty Acids (FISH OIL PO) Take by mouth.     omeprazole -sodium bicarbonate (ZEGERID) 40-1100 MG capsule Take 1 capsule by mouth daily before breakfast.     polyethylene glycol (MIRALAX  / GLYCOLAX ) packet Take 17 g by mouth daily as needed.     TRAZODONE  HCL PO Take 50 mg by mouth at bedtime as needed. Takes every night     Vitamin D, Cholecalciferol, 1000 units CAPS Take 1 tablet by mouth daily.     Zinc 50 MG TABS Take by mouth.     amoxicillin -clavulanate (AUGMENTIN ) 875-125 MG tablet Take 1 tablet by mouth 2 (two) times daily. (Patient not taking: Reported on 03/13/2024) 20 tablet 0   ibuprofen  (ADVIL ) 600 MG tablet Take 1 tablet (600 mg total) by mouth every 6 (six) hours. Please take every 6 hrs, and stagger this medication 3 hrs apart from Tylenol  (Patient not taking: Reported on 03/13/2024) 30 tablet 0   oxyCODONE  (ROXICODONE ) 5 MG immediate release tablet Take 1 tablet (5 mg total) by mouth every 6 (six) hours as needed for  severe pain (pain score 7-10). (Patient not taking: Reported on 03/13/2024) 25 tablet 0   No current facility-administered medications for this visit.    Allergies as of 03/13/2024 - Review Complete 11/01/2023  Allergen Reaction Noted   Latex Swelling 03/19/2015   Clindamycin/lincomycin Hives 06/24/2015   Peanut-containing drug products Other (See Comments) 03/29/2015   Sulfa antibiotics Other (See Comments) 03/19/2015   Zolpidem  Other (See Comments) 08/31/2023   Iodine Swelling and Rash 03/29/2015    Vitals: BP (!) 144/86 (Cuff Size: Normal)   Pulse 80   Ht 5' 3.5 (1.613 m)   Wt 131 lb 3.2 oz (59.5 kg)   BMI 22.88 kg/m  Last Weight:  Wt Readings from Last 1 Encounters:  03/13/24 131 lb 3.2 oz (59.5 kg)   Last  Height:   Ht Readings from Last 1 Encounters:  03/13/24 5' 3.5 (1.613 m)     Physical exam: Exam: Gen: NAD, conversant      CV: No palpitations or chest pain or SOB. VS: Breathing at a normal rate. Weight appears within normal limits. Not febrile. Eyes: Conjunctivae clear without exudates or hemorrhage  Neuro: Detailed Neurologic Exam  Speech:    Speech is normal; fluent and spontaneous with normal comprehension.  Cognition:    The patient is oriented to person, place, and time;     recent and remote memory intact;     language fluent;     normal attention, concentration, fund of knowledge Cranial Nerves:     03/13/2024    1:15 PM 10/04/2022   10:40 AM  Montreal Cognitive Assessment   Visuospatial/ Executive (0/5) 5 4  Naming (0/3) 3 3  Attention: Read list of digits (0/2) 2 2  Attention: Read list of letters (0/1) 1 1  Attention: Serial 7 subtraction starting at 100 (0/3) 3 3  Language: Repeat phrase (0/2) 2 1  Language : Fluency (0/1) 1 1  Abstraction (0/2) 2 2  Delayed Recall (0/5) 3 1  Orientation (0/6) 6 6  Total 28 24  Adjusted Score (based on education)  24      10/04/2022    8:39 AM 04/15/2020    9:43 AM  MMSE - Mini Mental State Exam  Orientation to time 5 5  Orientation to Place 5 4  Registration 3 3  Attention/ Calculation 5 4  Recall 2 2  Language- name 2 objects 2 2  Language- repeat 1 1  Language- follow 3 step command 3 3  Language- read & follow direction 1 1  Write a sentence 1 1  Copy design 1 1  Total score 29 27       The pupils are equal, round, and reactive to light. Visual fields are full Extraocular movements are intact.  The face is symmetric with normal sensation. The palate elevates in the midline. Hearing intact. Voice is normal. Shoulder shrug is normal. The tongue has normal motion without fasciculations.   Coordination: normal  Gait:    No abnormalities noted or reported  Motor Observation:   no involuntary movements  noted. Tone:    Appears normal  Posture:    Posture is normal. normal erect    Strength:    Strength is anti-gravity and symmetric in the upper and lower limbs.      Sensation: intact to LT, no reports of numbness or tingling or paresthesias          Assessment/Plan:  68 year old woman with family history of dementia, presenting for cognitive follow-up.  Cognition: Current MoCA 28/30 (improved from 24/30 one year ago). Formal neuropsychological testing shows overall intact cognition with only subtle inefficiencies in visuospatial judgment, complex attention, and initial encoding of new visual/unstructured verbal material. No evidence of a neurodegenerative process at this time. ADHD is unlikely given history and cognitive profile.  Risk factors: One copy of APOE ?4 allele; MRI shows mild-moderate chronic microvascular ischemic change, small chronic lacunar infarct in right midbrain, and mild generalized cortical atrophy without lobar predominance.  Comorbidities: History of severe obstructive sleep apnea, now status post Inspire implant (initiated May, in use for ~3 weeks) with significant symptomatic improvement. Also on nocturnal oxygen for hypoxemia. Longstanding sleep disorder likely contributed to prior cognitive inefficiencies. Anxiety and migraines also may play a role.  Labs: Broad metabolic and nutritional work-up normal, including thyroid , CBC, CMP, homocysteine, B1, MMA, B12, and folate. ATN profile negative (no amyloid biomarkers).  Overall picture most consistent with vascular cognitive risk and sleep-related cognitive inefficiencies, rather than Alzheimer's disease or another primary neurodegenerative process.  Plan  Cognitive Health  Reassurance provided: no evidence of Alzheimer's disease or other neurodegenerative dementia.  Encourage continued management of vascular risk factors (BP, cholesterol, glucose control, smoking avoidance,d exercise).  Consider cognitive  behavioral therapy (CBT) to address subtle attentional/encoding inefficiencies, especially given history of anxiety and stress.  Sleep/OSA Management  Continue with Inspire device nightly; excellent early response noted.  Continue supplemental O? overnight as prescribed.  Reinforced sleep hygiene and regular follow-up with sleep medicine.  Vascular & Neurologic Monitoring  Educated patient about the contribution of chronic microvascular ischemic changes to cognition.  Optimize risk factor modification in coordination with PCP (hypertension, lipids, diabetes prevention).  No current indication for dementia-specific pharmacotherapy.  Behavioral Health  Address underlying anxiety and stress, which may exacerbate cognitive inefficiencies. Referral to therapy/CBT discussed.  Follow-Up  Cognitive re-evaluation as needed, or sooner if new memory/cognitive symptoms emerge.  Routine follow-up with primary care for vascular and sleep comorbidities.    No orders of the defined types were placed in this encounter.    Cc: Candace Lamarr GORMAN DEWAINE Candace Lamarr GORMAN, MD  Candace Epp, MD  [facility] [address] [place], [place] [address]  Phone [phone] Fax [phone]  I spent 56 minutes of face-to-face and non-face-to-face time with patient on the No diagnosis found. diagnosis.  This included previsit chart review, lab review, study review, order entry, electronic health record documentation, patient education on the different diagnostic and therapeutic options, counseling and coordination of care, risks and benefits of management, compliance, or risk factor reduction

## 2024-03-13 NOTE — Patient Instructions (Addendum)
 ADHD / Attention difficulties: CBT is often used as an adjunct to medication in adults with ADHD. It doesn't directly increase attention span like stimulants do, but it helps patients develop practical skills -- such as time management, task organization, breaking down large tasks, and reducing procrastination -- which indirectly improve focus and productivity. It can also address the frustration, self-criticism, and low motivation that often accompany inattentiveness.  Anxiety / Depression: Inattentiveness can also stem from anxiety or depression, where the brain is preoccupied or slowed down. CBT helps by restructuring unhelpful thought patterns, teaching coping skills, and reducing symptoms, which in turn improves attention and concentration.  Functional / Stress-related inattentiveness: For patients without a primary attention disorder, CBT can help reduce distraction, improve mindfulness, and retrain habits that interfere with sustained focus.  Cognitive Behavioral Therapy (CBT) and Inattention  What is CBT? Cognitive Behavioral Therapy (CBT) is a type of talk therapy that focuses on the connection between thoughts, feelings, and behaviors. It teaches practical skills to help you better manage everyday challenges.  How can CBT help with inattention? Many people experience difficulty staying focused, being organized, or following through on tasks. This can happen with conditions such as ADHD, anxiety, depression, or simply from stress and fatigue. CBT does not "cure" inattentiveness, but it can give you tools to manage it more effectively:  Organization & Planning Skills: Breaking big tasks into smaller steps, using checklists, and setting reminders.  Time Management: Learning strategies to stay on schedule and reduce procrastination.  Improved Focus: Techniques like mindfulness and attention training to strengthen concentration.  Reducing Stress & Worry: Addressing anxious or negative  thoughts that can make it harder to focus.  Building Healthy Routines: Developing consistent sleep, exercise, and work habits that support attention.

## 2024-03-20 ENCOUNTER — Encounter: Payer: Self-pay | Admitting: Cardiology

## 2024-03-20 ENCOUNTER — Ambulatory Visit: Attending: Cardiology | Admitting: Cardiology

## 2024-03-20 VITALS — BP 135/85 | HR 69 | Ht 63.5 in | Wt 130.0 lb

## 2024-03-20 DIAGNOSIS — R931 Abnormal findings on diagnostic imaging of heart and coronary circulation: Secondary | ICD-10-CM | POA: Insufficient documentation

## 2024-03-20 DIAGNOSIS — G4734 Idiopathic sleep related nonobstructive alveolar hypoventilation: Secondary | ICD-10-CM | POA: Diagnosis present

## 2024-03-20 DIAGNOSIS — E7800 Pure hypercholesterolemia, unspecified: Secondary | ICD-10-CM | POA: Diagnosis present

## 2024-03-20 DIAGNOSIS — R9431 Abnormal electrocardiogram [ECG] [EKG]: Secondary | ICD-10-CM | POA: Diagnosis not present

## 2024-03-20 DIAGNOSIS — I2510 Atherosclerotic heart disease of native coronary artery without angina pectoris: Secondary | ICD-10-CM | POA: Diagnosis not present

## 2024-03-20 DIAGNOSIS — G4733 Obstructive sleep apnea (adult) (pediatric): Secondary | ICD-10-CM | POA: Insufficient documentation

## 2024-03-20 DIAGNOSIS — I2584 Coronary atherosclerosis due to calcified coronary lesion: Secondary | ICD-10-CM | POA: Diagnosis present

## 2024-03-20 NOTE — Patient Instructions (Signed)
 Medication Instructions:  Your physician recommends that you continue on your current medications as directed. Please refer to the Current Medication list given to you today.  *If you need a refill on your cardiac medications before your next appointment, please call your pharmacy*  Lab Work: NONE  If you have labs (blood work) drawn today and your tests are completely normal, you will receive your results only by: MyChart Message (if you have MyChart) OR A paper copy in the mail If you have any lab test that is abnormal or we need to change your treatment, we will call you to review the results.  Testing/Procedures: Your physician has requested that you have an echocardiogram. Echocardiography is a painless test that uses sound waves to create images of your heart. It provides your doctor with information about the size and shape of your heart and how well your heart's chambers and valves are working. This procedure takes approximately one hour. There are no restrictions for this procedure. Please do NOT wear cologne, perfume, aftershave, or lotions (deodorant is allowed). Please arrive 15 minutes prior to your appointment time.  Please note: We ask at that you not bring children with you during ultrasound (echo/ vascular) testing. Due to room size and safety concerns, children are not allowed in the ultrasound rooms during exams. Our front office staff cannot provide observation of children in our lobby area while testing is being conducted. An adult accompanying a patient to their appointment will only be allowed in the ultrasound room at the discretion of the ultrasound technician under special circumstances. We apologize for any inconvenience.  Your physician has requested that you have en exercise stress myoview. For further information please visit [email or web address] Please follow instructions as given.    You are scheduled for a Myocardial Perfusion Imaging Study. Please arrive 15  minutes prior to your appointment time for registration and insurance purposes.   The test will take approximately 3 to 4 hours to complete; you may bring reading material.  If someone comes with you to your appointment, they will need to remain in the main lobby due to limited space in the testing area.    How to prepare for your Myocardial Perfusion Test: Do not eat or drink 3 hours prior to your test, except you may have water. Do not consume products containing caffeine (regular or decaffeinated) 12 hours prior to your test. (ex: coffee, chocolate, sodas, tea). Do bring a list of your current medications with you.  If not listed below, you may take your medications as normal. Do wear comfortable clothes (no dresses or overalls) and walking shoes, tennis shoes preferred (No heels or open toe shoes are allowed). Do NOT wear cologne, perfume, aftershave, or lotions (deodorant is allowed). If these instructions are not followed, your test will have to be rescheduled.  If you cannot keep your appointment, please provide 24 hours notification to the Nuclear Lab, to avoid a possible $50 charge to your account.        Follow-Up: At [facility], you and your health needs are our priority.  As part of our continuing mission to provide you with exceptional heart care, our providers are all part of one team.  This team includes your primary Cardiologist (physician) and Advanced Practice Providers or APPs (Physician Assistants and Nurse Practitioners) who all work together to provide you with the care you need, when you need it.  Your next appointment:   1 year(s)  Provider:   Madonna Large,  DO

## 2024-03-20 NOTE — Progress Notes (Signed)
 Cardiology Office Note:  .   Date:  03/20/2024  ID:  Jenkins LITTIE Eke, DOB 04-Apr-1956, MRN [id number] PCP:  Chrystal Lamarr RAMAN, MD  Former Cardiology Providers: N/A [facility] Providers Cardiologist:  Madonna Large, DO , [facility] (established care November 2022) Electrophysiologist:  None  Click to update primary MD,subspecialty MD or APP then REFRESH:1}    Chief Complaint  Patient presents with   Follow-up    Nocturnal hypoxia    History of Present Illness: .   Candace Campbell is a 68 y.o. Caucasian female whose past medical history and cardiovascular risk factors includes: History of right-sided breast cancer treated with lumpectomy/radiation, OSA status post inspire, nocturnal hypoxia on supplemental oxygen, hyperlipidemia, severe coronary artery calcification, postmenopausal female.   In the past patient was referred to practice by PCP for severe coronary artery calcification management.  Patient has undergone appropriate cardiovascular/ischemia workup in the past as mentioned below.  During her follow-up back in March 2025 patient had a routine EKG which noted T WI which were more evident in the anterior leads when compared to prior tracings.  Since she was asymptomatic no additional workup was warranted at that time as part of shared medical decision making with the patient.  Patient was asked to follow-up on appearing basis.    Since last office visit, she underwent inspire implant and has done well.  However her sleep apnea reports of noted nocturnal hypoxia.  Outside records from [facility] reviewed which are available in the EMR.  She was noted to have nocturnal hypoxia for approximately 140 minutes.  Patient states that she has had nocturnal hypoxia on prior sleep studies.  She was referred to cardiology for further evaluation.  If cardiovascular workup is unremarkable they will proceed with pulmonary evaluation.  Clinically denies anginal chest pain Denies heart failure  symptoms Patient walks every day and throughout the week she does Pilates, yoga, and senior fit programs.  She has not noticed any change in physical endurance.   Review of Systems: .   Review of Systems  Cardiovascular:  Negative for chest pain, claudication, irregular heartbeat, leg swelling, near-syncope, orthopnea, palpitations, paroxysmal nocturnal dyspnea and syncope.  Respiratory:  Negative for shortness of breath.   Hematologic/Lymphatic: Negative for bleeding problem.    Studies Reviewed:   EKG Interpretation Date/Time:                  Wednesday September 14 2023 15:05:39 EDT Normal sinus rhythm Nonspecific ST and T wave abnormality When compared with ECG of 02-Mar-2007 13:01, Non-specific change in ST segment in Anterior leads T wave inversion now evident in Anterior leads  Echocardiogram: 05/26/2021: LVEF 55 to 60%, normal diastolic function, no significant valvular heart disease.  Stress Testing: Exercise nuclear stress test November 2022: Low risk study.   CT Cardiac Scoring:  04/21/2021 Left main: 0. LAD 390. LCx: 30. RCA 0 Total CAC 420AU, 95 th percentile for patient's age, sex, and race.  RADIOLOGY: NA  Risk Assessment/Calculations:   NA  Labs:    External Labs: Collected: 04/06/2022 Hemoglobin 12.8 g/dL, hematocrit 60.6%. BUN 14, creatinine 0.89. Sodium 140, potassium 4.3, chloride 101, bicarb 34. AST 15, alkaline phosphatase 46, ALT 14. Total cholesterol 160, triglycerides 54, HDL 70, LDL 79, non-HDL 90  External Labs: Collected: 06/07/2023 [facility] database. Total cholesterol 203, triglycerides 47, HDL 92, LDL 102  Physical Exam:    Today's Vitals   03/20/24 1617  BP: 135/85  Pulse: 69  SpO2: 95%  Weight: 130 lb (59 kg)  Height: 5' 3.5 (1.613 m)   Body mass index is 22.67 kg/m. Wt Readings from Last 3 Encounters:  03/20/24 130 lb (59 kg)  03/13/24 131 lb 3.2 oz (59.5 kg)  10/18/23 125 lb 14.1 oz (57.1 kg)    Physical Exam  Constitutional:  No distress.  hemodynamically stable  Neck: No JVD present.  Cardiovascular: Normal rate, regular rhythm, S1 normal and S2 normal. Exam reveals no gallop, no S3 and no S4.  No murmur heard. Pulmonary/Chest: Effort normal and breath sounds normal. No stridor. She has no wheezes. She has no rales.  Musculoskeletal:        General: No edema.     Cervical back: Neck supple.  Skin: Skin is warm.   Impression & Recommendation(s):  Impression:   ICD-10-CM   1. Coronary atherosclerosis due to calcified coronary lesion  I25.10 ECHOCARDIOGRAM COMPLETE   I25.84 MYOCARDIAL PERFUSION IMAGING    2. Agatston coronary artery calcium score greater than 400  R93.1     3. Nonspecific abnormal electrocardiogram (ECG) (EKG)  R94.31 ECHOCARDIOGRAM COMPLETE    MYOCARDIAL PERFUSION IMAGING    4. OSA (obstructive sleep apnea)  G47.33     5. Nocturnal hypoxia  G47.34 ECHOCARDIOGRAM COMPLETE    MYOCARDIAL PERFUSION IMAGING    6. Pure hypercholesterolemia  E78.00         Recommendation(s):  Coronary atherosclerosis due to calcified coronary lesion Agatston coronary artery calcium score greater than 400 Coronary calcium score October 2022: 420 AU, 95th percentile, predominantly in the LAD  Continue aspirin  81 mg p.o. daily. Continue atorvastatin 20 mg p.o. nightly Overall functional capacity is excellent for her age. Advised the patient that nocturnal hypoxia is likely secondary to pulmonary etiology. However, given the findings of prolonged nocturnal hypoxia, severe CAC within the coronary vasculature corresponding with T wave inversions in the anterior leads shared decision was to proceed forward with echocardiogram to evaluate LVEF and exercise nuclear stress test.  Exercise nuclear stress test will help evaluate overall functional capacity, exercise-induced arrhythmia/ischemia. If exercise nuclear stress is considered to be low risk no additional workup is warranted would defer pulmonary evaluation.   Patient agreeable with the plan of care  Pure hypercholesterolemia Currently on atorvastatin 20 mg p.o. daily.   She denies myalgia or other side effects.  OSA (obstructive sleep apnea) Nocturnal hypoxia Patient states that she is noted to have nocturnal hypoxia on prior sleep studies. However after her inspire follow-up report has noted approximately 140 minutes of nocturnal hypoxia.  She is currently on nasal cannula oxygen at home and referred to cardiology for further evaluation to rule out cardiovascular etiologies.   Compliant with inspire, follows with [facility]  Orders Placed:  Orders Placed This Encounter  Procedures   MYOCARDIAL PERFUSION IMAGING    Standing Status:   Future    Expiration Date:   03/20/2025    Patient weight in lbs:   130    Where [facility] performed[facility]    Type of stress:   Exercise   ECHOCARDIOGRAM COMPLETE    Standing Status:   Future    Expiration Date:   03/20/2025    Where should this test be performed:   [facility]    Does the patient weigh less than or greater than 250 lbs?:   Patient weighs less than 250 lbs    Perflutren DEFINITY (image enhancing agent) should be administered unless hypersensitivity  or allergy exist:   Administer Perflutren    Reason for exam-Echo:   Other-Full Diagnosis List    Full ICD-10/Reason for Exam:   Abnormal EKG [276271]    Full ICD-10/Reason for Exam:   Nocturnal hypoxia [723782]    Final Medication List:   No orders of the defined types were placed in this encounter.   Medications Discontinued During This Encounter  Medication Reason   MAGNESIUM  GLYCINATE ADVANCED PO Completed Course   ibuprofen  (ADVIL ) 600 MG tablet Completed Course   oxyCODONE  (ROXICODONE ) 5 MG immediate release tablet Completed Course   amoxicillin -clavulanate (AUGMENTIN ) 875-125 MG tablet Completed Course   acetaminophen  (TYLENOL ) 500 MG tablet Completed Course     Current Outpatient Medications:     aspirin  EC 81 MG tablet, Take 1 tablet (81 mg total) by mouth daily. Swallow whole., Disp: 30 tablet, Rfl: 11   atorvastatin (LIPITOR) 20 MG tablet, Take 20 mg by mouth daily., Disp: , Rfl:    azelastine  (ASTELIN ) 0.1 % nasal spray, U 1 SPR IEN BID, Disp: , Rfl:    busPIRone  (BUSPAR ) 15 MG tablet, TK 1 AND 1/2 T PO AM AND TK 2 TS QHS FOR ANXIETY PRN. MAY TAKE NOON DOSE IF NEEDED FOR SEVERE ANXIETY, Disp: , Rfl:    Calcium-Magnesium -Vitamin D (CALCIUM 500 PO), Take 1 tablet by mouth daily., Disp: , Rfl:    Denosumab  (PROLIA  Albee), Inject into the skin every 6 (six) months., Disp: , Rfl:    DULoxetine (CYMBALTA) 60 MG capsule, Take 60 mg by mouth daily., Disp: , Rfl:    famotidine (PEPCID) 20 MG tablet, Take 20 mg by mouth 2 (two) times daily., Disp: , Rfl:    lamoTRIgine  (LAMICTAL ) 200 MG tablet, Take 150 mg by mouth at bedtime., Disp: , Rfl:    Multiple Vitamin (MULTIVITAMIN WITH MINERALS) TABS tablet, Take 1 tablet by mouth daily., Disp: , Rfl:    Omega-3 Fatty Acids (FISH OIL PO), Take by mouth., Disp: , Rfl:    omeprazole -sodium bicarbonate (ZEGERID) 40-1100 MG capsule, Take 1 capsule by mouth daily before breakfast., Disp: , Rfl:    polyethylene glycol (MIRALAX  / GLYCOLAX ) packet, Take 17 g by mouth daily as needed., Disp: , Rfl:    TRAZODONE  HCL PO, Take 50 mg by mouth at bedtime as needed. Takes every night, Disp: , Rfl:    Vitamin D, Cholecalciferol, 1000 units CAPS, Take 1 tablet by mouth daily., Disp: , Rfl:    Zinc 50 MG TABS, Take by mouth., Disp: , Rfl:   Consent:    NA  Disposition:   1 year follow-up sooner if needed  Her questions and concerns were addressed to her satisfaction. She voices understanding of the recommendations provided during this encounter.    Signed, Madonna Michele HAS, [facility]  A Division of [facility] [address], [place], [place] [address]   03/20/2024 5:05 PM

## 2024-03-26 ENCOUNTER — Encounter (HOSPITAL_COMMUNITY): Payer: Self-pay | Admitting: Cardiology

## 2024-03-26 ENCOUNTER — Telehealth: Payer: Self-pay | Admitting: Cardiology

## 2024-03-26 NOTE — Telephone Encounter (Signed)
 Pt called and stated someone was suppose to call and schedule a stress test and a Echo. There were no orders for either test.    Best number [phone]

## 2024-03-26 NOTE — Telephone Encounter (Signed)
 Left voice message to call back 9/22

## 2024-04-13 ENCOUNTER — Encounter (HOSPITAL_COMMUNITY): Payer: Self-pay | Admitting: *Deleted

## 2024-05-03 ENCOUNTER — Other Ambulatory Visit: Payer: Self-pay | Admitting: Cardiology

## 2024-05-03 DIAGNOSIS — G4734 Idiopathic sleep related nonobstructive alveolar hypoventilation: Secondary | ICD-10-CM

## 2024-05-03 DIAGNOSIS — R9431 Abnormal electrocardiogram [ECG] [EKG]: Secondary | ICD-10-CM

## 2024-05-03 DIAGNOSIS — I2510 Atherosclerotic heart disease of native coronary artery without angina pectoris: Secondary | ICD-10-CM

## 2024-05-04 ENCOUNTER — Ambulatory Visit (HOSPITAL_COMMUNITY)
Admission: RE | Admit: 2024-05-04 | Discharge: 2024-05-04 | Disposition: A | Discharge status: Home / Self Care | Source: Ambulatory Visit | Attending: Internal Medicine | Admitting: Internal Medicine

## 2024-05-04 ENCOUNTER — Ambulatory Visit (HOSPITAL_BASED_OUTPATIENT_CLINIC_OR_DEPARTMENT_OTHER)
Admission: RE | Admit: 2024-05-04 | Discharge: 2024-05-04 | Disposition: A | Discharge status: Home / Self Care | Source: Ambulatory Visit | Attending: Internal Medicine | Admitting: Internal Medicine

## 2024-05-04 DIAGNOSIS — I2510 Atherosclerotic heart disease of native coronary artery without angina pectoris: Secondary | ICD-10-CM | POA: Insufficient documentation

## 2024-05-04 DIAGNOSIS — I2584 Coronary atherosclerosis due to calcified coronary lesion: Secondary | ICD-10-CM | POA: Insufficient documentation

## 2024-05-04 DIAGNOSIS — R9431 Abnormal electrocardiogram [ECG] [EKG]: Secondary | ICD-10-CM

## 2024-05-04 DIAGNOSIS — G4734 Idiopathic sleep related nonobstructive alveolar hypoventilation: Secondary | ICD-10-CM | POA: Diagnosis not present

## 2024-05-04 LAB — MYOCARDIAL PERFUSION IMAGING
Estimated workload: 8.5
Exercise duration (min): 7 min
Exercise duration (sec): 0 s
LV dias vol: 73 mL (ref 46–106)
LV sys vol: 21 mL (ref 3.8–5.2)
MPHR: 153 {beats}/min
Nuc Stress EF: 71 %
Peak HR: 142 {beats}/min
Percent HR: 92 %
Rest HR: 62 {beats}/min
Rest Nuclear Isotope Dose: 10.9 mCi
SDS: 0
SRS: 2
SSS: 0
ST Depression (mm): 0 mm
Stress Nuclear Isotope Dose: 32.6 mCi
TID: 0.82

## 2024-05-04 LAB — ECHOCARDIOGRAM COMPLETE
Area-P 1/2: 3.42 cm2
S' Lateral: 2.7 cm

## 2024-05-04 MED ORDER — TECHNETIUM TC 99M TETROFOSMIN IV KIT
32.6000 | PACK | Freq: Once | INTRAVENOUS | Status: AC | PRN
Start: 2024-05-04 — End: 2024-05-04
  Administered 2024-05-04: 32.6 via INTRAVENOUS

## 2024-05-04 MED ORDER — TECHNETIUM TC 99M TETROFOSMIN IV KIT
10.9000 | PACK | Freq: Once | INTRAVENOUS | Status: AC | PRN
Start: 2024-05-04 — End: 2024-05-04
  Administered 2024-05-04: 10.9 via INTRAVENOUS

## 2024-05-07 ENCOUNTER — Ambulatory Visit: Payer: Self-pay | Admitting: Cardiology

## 2024-06-02 ENCOUNTER — Ambulatory Visit: Payer: Self-pay | Admitting: Cardiology

## 2024-06-08 NOTE — Telephone Encounter (Signed)
-----   Message from [facility] sent at 06/02/2024 12:50 AM EST ----- Candace Campbell  This is to inform you that radiology department has over-read the non-cardiac portion. Reported findings:  Right sided renal stones, nonobstructive.  Please follow-up with PCP for further guidance.  Send a copy of the results to PCP to help coordinate care.  Dr. Michele ----- Message ----- From: Interface, Rad Results In Sent: 05/08/2024   7:06 AM EST To: Madonna Michele, DO

## 2024-06-08 NOTE — Telephone Encounter (Signed)
 Left detailed message per [facility] relaying Dr. Tyree note

## 2024-07-19 ENCOUNTER — Other Ambulatory Visit: Payer: Self-pay | Admitting: Obstetrics and Gynecology

## 2024-07-19 DIAGNOSIS — L539 Erythematous condition, unspecified: Secondary | ICD-10-CM

## 2024-07-24 ENCOUNTER — Ambulatory Visit
Admission: RE | Admit: 2024-07-24 | Discharge: 2024-07-24 | Disposition: A | Source: Ambulatory Visit | Attending: Obstetrics and Gynecology | Admitting: Obstetrics and Gynecology

## 2024-07-24 ENCOUNTER — Ambulatory Visit

## 2024-07-24 DIAGNOSIS — L539 Erythematous condition, unspecified: Secondary | ICD-10-CM
